# Patient Record
Sex: Male | Born: 1940 | Race: White | Hispanic: No | Marital: Married | Smoking: Former smoker
Health system: Southern US, Community
[De-identification: ages and names within clinical notes are randomized; demographics above are authoritative.]

## PROBLEM LIST (undated history)

## (undated) DIAGNOSIS — N529 Male erectile dysfunction, unspecified: Secondary | ICD-10-CM

## (undated) DIAGNOSIS — E785 Hyperlipidemia, unspecified: Secondary | ICD-10-CM

## (undated) DIAGNOSIS — T7840XA Allergy, unspecified, initial encounter: Secondary | ICD-10-CM

## (undated) DIAGNOSIS — I10 Essential (primary) hypertension: Secondary | ICD-10-CM

## (undated) DIAGNOSIS — E119 Type 2 diabetes mellitus without complications: Secondary | ICD-10-CM

## (undated) DIAGNOSIS — K219 Gastro-esophageal reflux disease without esophagitis: Secondary | ICD-10-CM

## (undated) DIAGNOSIS — H409 Unspecified glaucoma: Secondary | ICD-10-CM

## (undated) DIAGNOSIS — M720 Palmar fascial fibromatosis [Dupuytren]: Secondary | ICD-10-CM

## (undated) DIAGNOSIS — H3320 Serous retinal detachment, unspecified eye: Secondary | ICD-10-CM

## (undated) HISTORY — PX: EYE SURGERY: SHX253

## (undated) HISTORY — PX: HERNIA REPAIR: SHX51

## (undated) HISTORY — DX: Unspecified glaucoma: H40.9

## (undated) HISTORY — DX: Serous retinal detachment, unspecified eye: H33.20

## (undated) HISTORY — PX: CATARACT EXTRACTION: SUR2

## (undated) HISTORY — PX: RETINAL DETACHMENT SURGERY: SHX105

## (undated) HISTORY — DX: Gastro-esophageal reflux disease without esophagitis: K21.9

## (undated) HISTORY — DX: Hyperlipidemia, unspecified: E78.5

## (undated) HISTORY — DX: Palmar fascial fibromatosis (dupuytren): M72.0

## (undated) HISTORY — DX: Type 2 diabetes mellitus without complications: E11.9

## (undated) HISTORY — DX: Essential (primary) hypertension: I10

## (undated) HISTORY — DX: Male erectile dysfunction, unspecified: N52.9

## (undated) HISTORY — DX: Allergy, unspecified, initial encounter: T78.40XA

---

## 2004-10-05 ENCOUNTER — Ambulatory Visit: Payer: Self-pay | Admitting: Family Medicine

## 2004-10-12 ENCOUNTER — Ambulatory Visit: Payer: Self-pay | Admitting: Family Medicine

## 2004-11-11 ENCOUNTER — Ambulatory Visit: Payer: Self-pay | Admitting: Family Medicine

## 2004-11-22 ENCOUNTER — Ambulatory Visit: Payer: Self-pay | Admitting: Internal Medicine

## 2004-11-22 LAB — HM COLONOSCOPY

## 2007-01-01 ENCOUNTER — Ambulatory Visit: Payer: Self-pay | Admitting: Ophthalmology

## 2007-02-20 ENCOUNTER — Ambulatory Visit: Payer: Self-pay | Admitting: Ophthalmology

## 2007-10-13 ENCOUNTER — Ambulatory Visit: Payer: Self-pay | Admitting: Family Medicine

## 2008-09-09 ENCOUNTER — Ambulatory Visit: Payer: Self-pay | Admitting: Family Medicine

## 2011-02-12 ENCOUNTER — Ambulatory Visit: Payer: Self-pay | Admitting: Surgery

## 2014-01-13 LAB — LIPID PANEL
Cholesterol: 150 mg/dL (ref 0–200)
HDL: 44 mg/dL (ref 35–70)
LDL CALC: 83 mg/dL
LDl/HDL Ratio: 1.9
Triglycerides: 113 mg/dL (ref 40–160)

## 2014-01-13 LAB — BASIC METABOLIC PANEL
BUN: 20 mg/dL (ref 4–21)
CREATININE: 0.8 mg/dL (ref 0.6–1.3)
GLUCOSE: 124 mg/dL
POTASSIUM: 4.8 mmol/L (ref 3.4–5.3)
SODIUM: 141 mmol/L (ref 137–147)

## 2014-01-13 LAB — HEPATIC FUNCTION PANEL
ALT: 29 U/L (ref 10–40)
AST: 20 U/L (ref 14–40)
Alkaline Phosphatase: 77 U/L (ref 25–125)
Bilirubin, Total: 0.3 mg/dL

## 2014-01-13 LAB — PSA: PSA: 4.4

## 2014-05-11 LAB — HEMOGLOBIN A1C: HEMOGLOBIN A1C: 5.8 % (ref 4.0–6.0)

## 2014-09-24 DIAGNOSIS — H409 Unspecified glaucoma: Secondary | ICD-10-CM | POA: Insufficient documentation

## 2014-09-24 DIAGNOSIS — K130 Diseases of lips: Secondary | ICD-10-CM | POA: Insufficient documentation

## 2014-09-24 DIAGNOSIS — N521 Erectile dysfunction due to diseases classified elsewhere: Secondary | ICD-10-CM

## 2014-09-24 DIAGNOSIS — R972 Elevated prostate specific antigen [PSA]: Secondary | ICD-10-CM | POA: Insufficient documentation

## 2014-09-24 DIAGNOSIS — M720 Palmar fascial fibromatosis [Dupuytren]: Secondary | ICD-10-CM | POA: Insufficient documentation

## 2014-09-24 DIAGNOSIS — E11610 Type 2 diabetes mellitus with diabetic neuropathic arthropathy: Secondary | ICD-10-CM | POA: Insufficient documentation

## 2014-09-24 DIAGNOSIS — I10 Essential (primary) hypertension: Secondary | ICD-10-CM | POA: Insufficient documentation

## 2014-09-24 DIAGNOSIS — E782 Mixed hyperlipidemia: Secondary | ICD-10-CM | POA: Insufficient documentation

## 2014-09-24 DIAGNOSIS — J309 Allergic rhinitis, unspecified: Secondary | ICD-10-CM | POA: Insufficient documentation

## 2014-09-24 DIAGNOSIS — K219 Gastro-esophageal reflux disease without esophagitis: Secondary | ICD-10-CM | POA: Insufficient documentation

## 2014-09-24 DIAGNOSIS — H3320 Serous retinal detachment, unspecified eye: Secondary | ICD-10-CM | POA: Insufficient documentation

## 2014-09-24 DIAGNOSIS — R7401 Elevation of levels of liver transaminase levels: Secondary | ICD-10-CM | POA: Insufficient documentation

## 2014-09-24 DIAGNOSIS — E785 Hyperlipidemia, unspecified: Secondary | ICD-10-CM | POA: Insufficient documentation

## 2014-09-24 DIAGNOSIS — K4090 Unilateral inguinal hernia, without obstruction or gangrene, not specified as recurrent: Secondary | ICD-10-CM | POA: Insufficient documentation

## 2014-09-24 DIAGNOSIS — N400 Enlarged prostate without lower urinary tract symptoms: Secondary | ICD-10-CM | POA: Insufficient documentation

## 2014-09-24 DIAGNOSIS — M5412 Radiculopathy, cervical region: Secondary | ICD-10-CM | POA: Insufficient documentation

## 2014-09-24 DIAGNOSIS — E7800 Pure hypercholesterolemia, unspecified: Secondary | ICD-10-CM | POA: Insufficient documentation

## 2014-09-24 DIAGNOSIS — E1169 Type 2 diabetes mellitus with other specified complication: Secondary | ICD-10-CM | POA: Insufficient documentation

## 2014-11-04 ENCOUNTER — Ambulatory Visit (INDEPENDENT_AMBULATORY_CARE_PROVIDER_SITE_OTHER): Payer: PPO | Admitting: Family Medicine

## 2014-11-04 ENCOUNTER — Encounter: Payer: Self-pay | Admitting: Family Medicine

## 2014-11-04 VITALS — BP 102/56 | HR 76 | Temp 98.1°F | Resp 16 | Ht 74.0 in | Wt 211.0 lb

## 2014-11-04 DIAGNOSIS — R972 Elevated prostate specific antigen [PSA]: Secondary | ICD-10-CM

## 2014-11-04 DIAGNOSIS — E785 Hyperlipidemia, unspecified: Secondary | ICD-10-CM | POA: Diagnosis not present

## 2014-11-04 DIAGNOSIS — E119 Type 2 diabetes mellitus without complications: Secondary | ICD-10-CM | POA: Diagnosis not present

## 2014-11-04 DIAGNOSIS — I10 Essential (primary) hypertension: Secondary | ICD-10-CM

## 2014-11-04 NOTE — Progress Notes (Signed)
Patient ID: Roberto Leonard, male   DOB: [birth year], 74 y.o.   MRN: [id number]   PHILOPATER MUCHA  MRN: [id number] DOB: August 18, 1940  Subjective:  HPI   1. Type 2 diabetes mellitus without complication Patient is a 74 year old male who presents for follow up of his diabetes.  His last visit was on 05/11/14 and his A1C at that time was 5.8.  No management changes were made at the last visit.  He is currently taking Actos 15 mg daily. He reports good compliance and tolerance of the medicine.  He is checking his glucose about every other week and states they have been running about 100-110.  2. Essential hypertension Patient was in 6 months ago and at that time his blood pressure was found to be 108/68.  He is on Losartan and Amlodipine.  The patient reports that he was having trouble with his feet about 2 months ago and stopped taking the Amlodipine for a month to see if it could be contributing to his problem.  Patient did not see any improvement in his symptoms being off of the Amlodipine and it wasn't until he put double inserts into his shoes did he get any relief of his foot pain.  Patient has been back on the Amlodipine for about 1 month now and does not feel he is having any side effects.  3. Hyperlipidemia Patient is on Simvastatin for his cholesterol  His last lab was about 9 months ago.  He reports good compliance and tolerance of his medication.   Patient Active Problem List   Diagnosis Date Noted  . Allergic rhinitis 09/24/2014  . 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 Cheilitis 09/24/2014  . Abnormal prostate specific antigen 09/24/2014  . Erectile dysfunction associated with type 2 diabetes mellitus 09/24/2014  . 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 Essential (primary) hypertension 09/24/2014  . Acid reflux 09/24/2014  . Glaucoma 09/24/2014  . Inguinal hernia 09/24/2014  . Hypercholesteremia 09/24/2014  . Detached retina  09/24/2014  . Type 2 diabetes mellitus with diabetic neuropathic arthropathy 09/24/2014    Past Medical History  Diagnosis Date  . Allergy   . GERD (gastroesophageal reflux disease)   . Hypertension   . Diabetes mellitus without complication   . Hyperlipidemia   . ED (erectile dysfunction)   . Dupuytren's contracture   . Glaucoma   . Detached retina     History   Social History  . Marital Status: Married    Spouse Name: N/A  . Number of Children: N/A  . Years of Education: N/A   Occupational History  . Not on file.   Social History Main Topics  . Smoking status: Former Smoker -- 0.50 packs/day for 2 years    Types: Cigarettes    Quit date: 05/14/1963  . Smokeless tobacco: Not on file  . Alcohol Use: No  . Drug Use: Not on file  . Sexual Activity: Not on file   Other Topics Concern  . Not on file   Social History Narrative    Outpatient Prescriptions Prior to Visit  Medication Sig Dispense Refill  . amLODipine (NORVASC) 10 MG tablet Take 1 tablet by mouth daily.    Marland Kitchen aspirin 81 MG tablet Take 1 tablet by mouth daily.    . Cetirizine HCl 10 MG CAPS Take 1 capsule by mouth daily as needed.    . Cholecalciferol (VITAMIN D) 2000 UNITS CAPS Take 1 capsule  by mouth daily.    Marland Kitchen losartan (COZAAR) 100 MG tablet Take 1 tablet by mouth daily.    Marland Kitchen Lysine HCl 1000 MG TABS Take 1 tablet by mouth daily.    . MULTIPLE VITAMIN PO Take 1 tablet by mouth daily.    . niacin 500 MG tablet Take 1 tablet by mouth daily.    . Omega-3 Fatty Acids (FISH OIL BURP-LESS) 1200 MG CAPS Take 2 capsules by mouth daily.    Marland Kitchen omeprazole (PRILOSEC) 20 MG capsule Take 1 capsule by mouth daily.    . pioglitazone (ACTOS) 15 MG tablet Take 1 tablet by mouth daily.    . sildenafil (VIAGRA) 100 MG tablet Take 1 tablet by mouth daily as needed.    . simvastatin (ZOCOR) 20 MG tablet Take 1 tablet by mouth at bedtime.     No facility-administered medications prior to visit.    Allergies  Allergen  Reactions  . Sulfa Antibiotics     Review of Systems  Constitutional: Negative.   Respiratory: Negative.   Cardiovascular: Negative.    Objective:  BP 102/56 mmHg  Pulse 76  Temp(Src) 98.1 F (36.7 C) (Oral)  Resp 16  Ht 6\' 2"  (1.88 m)  Wt 211 lb (95.709 kg)  BMI 27.08 kg/m2  Physical Exam  Constitutional: He is well-developed, well-nourished, and in no distress.  Cardiovascular: Normal rate and normal heart sounds.   Pulmonary/Chest: Effort normal and breath sounds normal.    Assessment and Plan :   1. Type 2 diabetes mellitus without complication  - HgB V4Q  2. Essential hypertension  - CBC With Differential/Platelet - COMPLETE METABOLIC PANEL WITH GFR - TSH  3. Hyperlipidemia  - Lipid Panel With LDL/HDL Ratio  4. Elevated PSA  - PSA  5. Foot pain with bony changes to the lateral aspect of the fifth tarsal head Plantar fasciitis, also the right foot. His problems are improving with  padding and wider shoes.  Miguel Aschoff MD [facility] 11/04/2014 3:28 PM

## 2014-11-06 LAB — LIPID PANEL WITH LDL/HDL RATIO
Cholesterol, Total: 139 mg/dL (ref 100–199)
HDL: 40 mg/dL (ref >39)
LDL Calculated: 77 mg/dL (ref 0–99)
LDl/HDL Ratio: 1.9 ratio units (ref 0.0–3.6)
TRIGLYCERIDES: 112 mg/dL (ref 0–149)
VLDL Cholesterol Cal: 22 mg/dL (ref 5–40)

## 2014-11-06 LAB — COMPREHENSIVE METABOLIC PANEL
A/G RATIO: 1.7 (ref 1.1–2.5)
ALK PHOS: 72 IU/L (ref 39–117)
ALT: 34 IU/L (ref 0–44)
AST: 21 IU/L (ref 0–40)
Albumin: 4.3 g/dL (ref 3.5–4.8)
BILIRUBIN TOTAL: 0.5 mg/dL (ref 0.0–1.2)
BUN/Creatinine Ratio: 24 — ABNORMAL HIGH (ref 10–22)
BUN: 18 mg/dL (ref 8–27)
CHLORIDE: 103 mmol/L (ref 97–108)
CO2: 22 mmol/L (ref 18–29)
CREATININE: 0.75 mg/dL — AB (ref 0.76–1.27)
Calcium: 9.3 mg/dL (ref 8.6–10.2)
GFR, EST AFRICAN AMERICAN: 104 mL/min/{1.73_m2} (ref >59)
GFR, EST NON AFRICAN AMERICAN: 90 mL/min/{1.73_m2} (ref >59)
GLOBULIN, TOTAL: 2.5 g/dL (ref 1.5–4.5)
GLUCOSE: 120 mg/dL — AB (ref 65–99)
POTASSIUM: 4.6 mmol/L (ref 3.5–5.2)
Sodium: 143 mmol/L (ref 134–144)
Total Protein: 6.8 g/dL (ref 6.0–8.5)

## 2014-11-06 LAB — TSH: TSH: 1.28 u[IU]/mL (ref 0.450–4.500)

## 2014-11-06 LAB — HEMOGLOBIN A1C
Est. average glucose Bld gHb Est-mCnc: 137 mg/dL
Hgb A1c MFr Bld: 6.4 % — ABNORMAL HIGH (ref 4.8–5.6)

## 2014-11-06 LAB — PSA: Prostate Specific Ag, Serum: 5.3 ng/mL — ABNORMAL HIGH (ref 0.0–4.0)

## 2014-11-10 LAB — CBC WITH DIFFERENTIAL/PLATELET

## 2014-11-16 ENCOUNTER — Telehealth: Payer: Self-pay

## 2014-11-16 NOTE — Telephone Encounter (Signed)
LMTCB ED 

## 2014-11-16 NOTE — Telephone Encounter (Signed)
-----   Message from [name]., MD sent at 11/13/2014  7:50 PM EDT ----- Labs stable.

## 2014-11-17 NOTE — Telephone Encounter (Signed)
Pt advised-aa 

## 2014-11-17 NOTE — Telephone Encounter (Signed)
Pt returning call.  CB#[phone]/MJ

## 2014-11-25 ENCOUNTER — Telehealth: Payer: Self-pay | Admitting: Family Medicine

## 2014-11-25 DIAGNOSIS — Z1211 Encounter for screening for malignant neoplasm of colon: Secondary | ICD-10-CM

## 2014-11-25 NOTE — Telephone Encounter (Signed)
Roberto Leonard from Dr Dorothey Baseman office states that pt came into their office inquiring about his referral for a colonoscopy.There is no order so she could not set anything up Call back # [phone]

## 2014-11-26 ENCOUNTER — Telehealth: Payer: Self-pay | Admitting: Gastroenterology

## 2014-11-26 ENCOUNTER — Telehealth: Payer: Self-pay

## 2014-11-26 ENCOUNTER — Other Ambulatory Visit: Payer: Self-pay

## 2014-11-26 NOTE — Telephone Encounter (Signed)
Gastroenterology Pre-Procedure Review  Request Date: 01-03-15 Requesting Physician: Dr. Rosanna Randy  PATIENT REVIEW QUESTIONS: The patient responded to the following health history questions as indicated:    1. Are you having any GI issues? no 2. Do you have a personal history of Polyps? no 3. Do you have a family history of Colon Cancer or Polyps? no 4. Diabetes Mellitus? yes (Type 2) 5. Joint replacements in the past 12 months?no 6. Major health problems in the past 3 months?no 7. Any artificial heart valves, MVP, or defibrillator?no    MEDICATIONS & ALLERGIES:    Patient reports the following regarding taking any anticoagulation/antiplatelet therapy:   Plavix, Coumadin, Eliquis, Xarelto, Lovenox, Pradaxa, Brilinta, or Effient? no Aspirin? yes (81mg )  Patient confirms/reports the following medications:  Current Outpatient Prescriptions  Medication Sig Dispense Refill  . amLODipine (NORVASC) 10 MG tablet Take 1 tablet by mouth daily.    Marland Kitchen aspirin 81 MG tablet Take 1 tablet by mouth daily.    . Cetirizine HCl 10 MG CAPS Take 1 capsule by mouth daily as needed.    . Cholecalciferol (VITAMIN D) 2000 UNITS CAPS Take 1 capsule by mouth daily.    Marland Kitchen losartan (COZAAR) 100 MG tablet Take 1 tablet by mouth daily.    Marland Kitchen Lysine HCl 1000 MG TABS Take 1 tablet by mouth daily.    . MULTIPLE VITAMIN PO Take 1 tablet by mouth daily.    . niacin 500 MG tablet Take 1 tablet by mouth daily.    . Omega-3 Fatty Acids (FISH OIL BURP-LESS) 1200 MG CAPS Take 2 capsules by mouth daily.    Marland Kitchen omeprazole (PRILOSEC) 20 MG capsule Take 1 capsule by mouth daily.    . pioglitazone (ACTOS) 15 MG tablet Take 1 tablet by mouth daily.    . sildenafil (VIAGRA) 100 MG tablet Take 1 tablet by mouth daily as needed.    . simvastatin (ZOCOR) 20 MG tablet Take 1 tablet by mouth at bedtime.     No current facility-administered medications for this visit.    Patient confirms/reports the following allergies:  Allergies   Allergen Reactions  . Sulfa Antibiotics     No orders of the defined types were placed in this encounter.    AUTHORIZATION INFORMATION Primary Insurance: 1D#: Group #:  Secondary Insurance: 1D#: Group #:  SCHEDULE INFORMATION: Date: 01-03-15 Time: Location: [place]

## 2014-11-26 NOTE — Telephone Encounter (Signed)
Patient came into office on 11/25/14 to inquire about his referral from Dr Rosanna Randy to GI for colonoscopy screening. I informed him we didn't have a referral, but I would call Dr Alben Spittle office to check on the status of the referral. I called Dr Alben Spittle office and let them know and they sent the referral later that day (7/14)

## 2014-12-31 NOTE — Discharge Instructions (Signed)

## 2015-01-03 ENCOUNTER — Ambulatory Visit: Payer: PPO | Admitting: Anesthesiology

## 2015-01-03 ENCOUNTER — Other Ambulatory Visit: Payer: Self-pay | Admitting: Gastroenterology

## 2015-01-03 ENCOUNTER — Ambulatory Visit
Admission: RE | Admit: 2015-01-03 | Discharge: 2015-01-03 | Disposition: A | Discharge status: Home / Self Care | Payer: PPO | Source: Ambulatory Visit | Attending: Gastroenterology | Admitting: Gastroenterology

## 2015-01-03 ENCOUNTER — Encounter
Admission: RE | Disposition: A | Discharge status: Home / Self Care | Payer: Self-pay | Source: Ambulatory Visit | Attending: Gastroenterology

## 2015-01-03 DIAGNOSIS — K5730 Diverticulosis of large intestine without perforation or abscess without bleeding: Secondary | ICD-10-CM | POA: Diagnosis not present

## 2015-01-03 DIAGNOSIS — N529 Male erectile dysfunction, unspecified: Secondary | ICD-10-CM | POA: Insufficient documentation

## 2015-01-03 DIAGNOSIS — E119 Type 2 diabetes mellitus without complications: Secondary | ICD-10-CM | POA: Diagnosis not present

## 2015-01-03 DIAGNOSIS — Z1211 Encounter for screening for malignant neoplasm of colon: Secondary | ICD-10-CM | POA: Insufficient documentation

## 2015-01-03 DIAGNOSIS — K219 Gastro-esophageal reflux disease without esophagitis: Secondary | ICD-10-CM | POA: Insufficient documentation

## 2015-01-03 DIAGNOSIS — K640 First degree hemorrhoids: Secondary | ICD-10-CM | POA: Diagnosis not present

## 2015-01-03 DIAGNOSIS — H409 Unspecified glaucoma: Secondary | ICD-10-CM | POA: Insufficient documentation

## 2015-01-03 DIAGNOSIS — Z7982 Long term (current) use of aspirin: Secondary | ICD-10-CM | POA: Insufficient documentation

## 2015-01-03 DIAGNOSIS — D125 Benign neoplasm of sigmoid colon: Secondary | ICD-10-CM | POA: Insufficient documentation

## 2015-01-03 DIAGNOSIS — Z882 Allergy status to sulfonamides status: Secondary | ICD-10-CM | POA: Insufficient documentation

## 2015-01-03 DIAGNOSIS — Z79899 Other long term (current) drug therapy: Secondary | ICD-10-CM | POA: Insufficient documentation

## 2015-01-03 DIAGNOSIS — E785 Hyperlipidemia, unspecified: Secondary | ICD-10-CM | POA: Insufficient documentation

## 2015-01-03 DIAGNOSIS — I10 Essential (primary) hypertension: Secondary | ICD-10-CM | POA: Diagnosis not present

## 2015-01-03 DIAGNOSIS — Z87891 Personal history of nicotine dependence: Secondary | ICD-10-CM | POA: Insufficient documentation

## 2015-01-03 HISTORY — PX: COLONOSCOPY WITH PROPOFOL: SHX5780

## 2015-01-03 HISTORY — PX: POLYPECTOMY: SHX5525

## 2015-01-03 LAB — GLUCOSE, CAPILLARY
GLUCOSE-CAPILLARY: 109 mg/dL — AB (ref 65–99)
Glucose-Capillary: 117 mg/dL — ABNORMAL HIGH (ref 65–99)

## 2015-01-03 SURGERY — COLONOSCOPY WITH PROPOFOL
Anesthesia: Monitor Anesthesia Care | Wound class: Contaminated

## 2015-01-03 MED ORDER — ACETAMINOPHEN 325 MG PO TABS: 325.0000 mg | ORAL_TABLET | ORAL | Status: DC | PRN

## 2015-01-03 MED ORDER — LACTATED RINGERS IV SOLN
INTRAVENOUS | Status: DC
  Administered 2015-01-03: 08:00:00 via INTRAVENOUS

## 2015-01-03 MED ORDER — ACETAMINOPHEN 160 MG/5ML PO SOLN: 325.0000 mg | ORAL | Status: DC | PRN

## 2015-01-03 MED ORDER — SODIUM CHLORIDE 0.9 % IV SOLN: INTRAVENOUS | Status: DC

## 2015-01-03 MED ORDER — LIDOCAINE HCL (CARDIAC) 20 MG/ML IV SOLN
INTRAVENOUS | Status: DC | PRN
  Administered 2015-01-03: 40 mg via INTRAVENOUS

## 2015-01-03 MED ORDER — PROPOFOL 10 MG/ML IV BOLUS
INTRAVENOUS | Status: DC | PRN
  Administered 2015-01-03 (×3): 50 mg via INTRAVENOUS
  Administered 2015-01-03: 100 mg via INTRAVENOUS

## 2015-01-03 SURGICAL SUPPLY — 28 items

## 2015-01-03 NOTE — Anesthesia Preprocedure Evaluation (Signed)
Anesthesia Evaluation  Patient identified by MRN, date of birth, ID band  Reviewed: Allergy & Precautions, H&P , NPO status , Patient's Chart, lab work & pertinent test results  Airway Mallampati: I  TM Distance: >3 FB Neck ROM: full    Dental no notable dental hx.    Pulmonary former smoker,    Pulmonary exam normal       Cardiovascular hypertension, Rhythm:regular Rate:Normal     Neuro/Psych    GI/Hepatic GERD-  ,  Endo/Other  diabetes, Well Controlled, Type 2  Renal/GU      Musculoskeletal   Abdominal   Peds  Hematology   Anesthesia Other Findings   Reproductive/Obstetrics                             Anesthesia Physical Anesthesia Plan  ASA: II  Anesthesia Plan: MAC   Post-op Pain Management:    Induction:   Airway Management Planned:   Additional Equipment:   Intra-op Plan:   Post-operative Plan:   Informed Consent: I have reviewed the patients History and Physical, chart, labs and discussed the procedure including the risks, benefits and alternatives for the proposed anesthesia with the patient or authorized representative who has indicated his/her understanding and acceptance.     Plan Discussed with: CRNA  Anesthesia Plan Comments:         Anesthesia Quick Evaluation

## 2015-01-03 NOTE — Transfer of Care (Signed)
Immediate Anesthesia Transfer of Care Note  Patient: Roberto Leonard  Procedure(s) Performed: Procedure(s) with comments: COLONOSCOPY WITH PROPOFOL (N/A) - DIABETIC-ORAL MEDS  Patient Location: PACU  Anesthesia Type: MAC  Level of Consciousness: awake, alert  and patient cooperative  Airway and Oxygen Therapy: Patient Spontanous Breathing and Patient connected to supplemental oxygen  Post-op Assessment: Post-op Vital signs reviewed, Patient's Cardiovascular Status Stable, Respiratory Function Stable, Patent Airway and No signs of Nausea or vomiting  Post-op Vital Signs: Reviewed and stable  Complications: No apparent anesthesia complications

## 2015-01-03 NOTE — Anesthesia Procedure Notes (Signed)
Procedure Name: MAC Performed by: Morrisa Aldaba Pre-anesthesia Checklist: Patient identified, Emergency Drugs available, Suction available, Timeout performed and Patient being monitored Patient Re-evaluated:Patient Re-evaluated prior to inductionOxygen Delivery Method: Nasal cannula Placement Confirmation: positive ETCO2     

## 2015-01-03 NOTE — Op Note (Signed)
[facility] Patient Name: Roberto Leonard Procedure Date: 01/03/2015 7:25 AM MRN: [id number] Account #: [id number] Date of Birth: 28-Dec-1940 Admit Type: Outpatient Age: 74 Room: [facility] OR ROOM 01 Gender: Male Note Status: Finalized Procedure:         Colonoscopy Indications:       Screening for colorectal malignant neoplasm Providers:         Lucilla Lame, MD Referring MD:      [name], MD (Referring MD) Medicines:         Propofol per Anesthesia Complications:     No immediate complications. Procedure:         Pre-Anesthesia Assessment:                    - Prior to the procedure, a History and Physical was                     performed, and patient medications and allergies were                     reviewed. The patient's tolerance of previous anesthesia                     was also reviewed. The risks and benefits of the procedure                     and the sedation options and risks were discussed with the                     patient. All questions were answered, and informed consent                     was obtained. Prior Anticoagulants: The patient has taken                     no previous anticoagulant or antiplatelet agents. ASA                     Grade Assessment: II - A patient with mild systemic                     disease. After reviewing the risks and benefits, the                     patient was deemed in satisfactory condition to undergo                     the procedure.                    After obtaining informed consent, the colonoscope was                     passed under direct vision. Throughout the procedure, the                     patient's blood pressure, pulse, and oxygen saturations                     were monitored continuously. The Olympus CF H180AL                     colonoscope (S#: [id number]) was introduced through the anus  and advanced to the the cecum, identified by appendiceal                 orifice and ileocecal valve. The colonoscopy was performed                     without difficulty. The patient tolerated the procedure                     well. The quality of the bowel preparation was good. Findings:      The perianal and digital rectal examinations were normal.      A 4 mm polyp was found in the sigmoid colon. The polyp was sessile. The       polyp was removed with a cold snare. Resection and retrieval were       complete.      Non-bleeding internal hemorrhoids were found during retroflexion. The       hemorrhoids were Grade I (internal hemorrhoids that do not prolapse).      Multiple small-mouthed diverticula were found in the sigmoid colon. Impression:        - One 4 mm polyp in the sigmoid colon. Resected and                     retrieved.                    - Non-bleeding internal hemorrhoids.                    - Diverticulosis in the sigmoid colon. Recommendation:    - Await pathology results.                    - Repeat colonoscopy in 5 years if polyp adenoma and 10                     years if hyperplastic Procedure Code(s): --- Professional ---                    [phone], Colonoscopy, flexible; with removal of tumor(s),                     polyp(s), or other lesion(s) by snare technique Diagnosis Code(s): --- Professional ---                    Z12.11, Encounter for screening for malignant neoplasm of                     colon                    D12.5, Benign neoplasm of sigmoid colon CPT copyright 2014 American Medical Association. All rights reserved. The codes documented in this report are preliminary and upon coder review may  be revised to meet current compliance requirements. Lucilla Lame, MD 01/03/2015 7:56:40 AM This report has been signed electronically. Number of Addenda: 0 Note Initiated On: 01/03/2015 7:25 AM Scope Withdrawal Time: 0 hours 7 minutes 36 seconds  Total Procedure Duration: 0 hours 12 minutes 59 seconds       [facility]

## 2015-01-03 NOTE — Anesthesia Postprocedure Evaluation (Signed)
  Anesthesia Post-op Note  Patient: Roberto Leonard  Procedure(s) Performed: Procedure(s) with comments: COLONOSCOPY WITH PROPOFOL (N/A) - DIABETIC-ORAL MEDS POLYPECTOMY (N/A) - Sigmoid polyp  Anesthesia type:MAC  Patient location: PACU  Post pain: Pain level controlled  Post assessment: Post-op Vital signs reviewed, Patient's Cardiovascular Status Stable, Respiratory Function Stable, Patent Airway and No signs of Nausea or vomiting  Post vital signs: Reviewed and stable  Last Vitals:  Filed Vitals:   01/03/15 0815  BP: 109/58  Pulse: 69  Temp:   Resp: 13    Level of consciousness: awake, alert  and patient cooperative  Complications: No apparent anesthesia complications

## 2015-01-03 NOTE — H&P (Signed)
[facility]  [address], [place], [place] [address] Phone: [phone] Fax : [phone]  Primary Care Physician:  [name], MD Primary Gastroenterologist:  Dr. Allen Norris  Pre-Procedure History & Physical: HPI:  Roberto Leonard is a 74 y.o. male is here for a screening colonoscopy.   Past Medical History  Diagnosis Date  . Allergy   . GERD (gastroesophageal reflux disease)   . Hypertension   . Hyperlipidemia   . ED (erectile dysfunction)   . Dupuytren's contracture   . Glaucoma   . Detached retina     RIGHT  . Diabetes mellitus without complication     ORAL MED    Past Surgical History  Procedure Laterality Date  . Cataract extraction      right  . Retinal detachment surgery      right eye, wrong size lense, secondary surgery to replace it  . Hernia repair      Prior to Admission medications   Medication Sig Start Date End Date Taking? Authorizing Provider  amLODipine (NORVASC) 10 MG tablet Take 1 tablet by mouth daily. PM 02/19/14  Yes Historical Provider, MD  aspirin 81 MG tablet Take 1 tablet by mouth daily. 03/26/11  Yes Historical Provider, MD  Cetirizine HCl 10 MG CAPS Take 1 capsule by mouth daily as needed. 03/26/11  Yes Historical Provider, MD  Cholecalciferol ([employer]S VIT D 5000 HIGH-POTENCY) 5000 UNITS capsule Take 5,000 Units by mouth daily.   Yes Historical Provider, MD  MULTIPLE VITAMIN PO Take 1 tablet by mouth daily. 03/26/11  Yes Historical Provider, MD  niacin 500 MG tablet Take 1 tablet by mouth daily. 03/26/11  Yes Historical Provider, MD  Omega-3 Fatty Acids (FISH OIL BURP-LESS) 1200 MG CAPS Take 2 capsules by mouth daily. 03/26/11  Yes Historical Provider, MD  losartan (COZAAR) 100 MG tablet Take 1 tablet by mouth daily. AM 02/19/14   Historical Provider, MD  Lysine HCl 1000 MG TABS Take 1 tablet by mouth daily.    Historical Provider, MD  omeprazole (PRILOSEC) 20 MG capsule Take 1 capsule by mouth daily. AM 08/02/14    Historical Provider, MD  pioglitazone (ACTOS) 15 MG tablet Take 1 tablet by mouth daily. AM 02/19/14   Historical Provider, MD  sildenafil (VIAGRA) 100 MG tablet Take 1 tablet by mouth daily as needed. 03/26/11   Historical Provider, MD  simvastatin (ZOCOR) 20 MG tablet Take 1 tablet by mouth at bedtime. 09/06/14   Historical Provider, MD    Allergies as of 11/26/2014 - Review Complete 11/26/2014  Allergen Reaction Noted  . Sulfa antibiotics  09/24/2014    Family History  Problem Relation Age of Onset  . Heart disease Mother     Died from CHF  . Diabetes Mother   . Diabetes Father   . Heart disease Father     Died from MI  . Hypertension Father   . Hypertension Sister   . Diabetes Sister     Social History   Social History  . Marital Status: Married    Spouse Name: N/A  . Number of Children: N/A  . Years of Education: N/A   Occupational History  . Not on file.   Social History Main Topics  . Smoking status: Former Smoker -- 0.50 packs/day for 2 years    Types: Cigarettes    Quit date: 05/14/1963  . Smokeless tobacco: Not on file  . Alcohol Use: No  . Drug Use: Not on file  . Sexual  Activity: Not on file   Other Topics Concern  . Not on file   Social History Narrative    Review of Systems: See HPI, otherwise negative ROS  Physical Exam: BP 122/61 mmHg  Pulse 63  Temp(Src) 97.7 F (36.5 C) (Oral)  Resp 16  Ht 6\' 2"  (1.88 m)  Wt 201 lb (91.173 kg)  BMI 25.80 kg/m2  SpO2 98% General:   Alert,  pleasant and cooperative in NAD Head:  Normocephalic and atraumatic. Neck:  Supple; no masses or thyromegaly. Lungs:  Clear throughout to auscultation.    Heart:  Regular rate and rhythm. Abdomen:  Soft, nontender and nondistended. Normal bowel sounds, without guarding, and without rebound.   Neurologic:  Alert and  oriented x4;  grossly normal neurologically.  Impression/Plan: Roberto Leonard is now here to undergo a screening colonoscopy.  Risks, benefits,  and alternatives regarding colonoscopy have been reviewed with the patient.  Questions have been answered.  All parties agreeable.

## 2015-01-04 ENCOUNTER — Encounter: Payer: Self-pay | Admitting: Gastroenterology

## 2015-01-10 ENCOUNTER — Encounter: Payer: Self-pay | Admitting: Gastroenterology

## 2015-01-27 ENCOUNTER — Other Ambulatory Visit: Payer: Self-pay | Admitting: Family Medicine

## 2015-02-10 ENCOUNTER — Encounter: Payer: Self-pay | Admitting: Family Medicine

## 2015-02-10 ENCOUNTER — Ambulatory Visit (INDEPENDENT_AMBULATORY_CARE_PROVIDER_SITE_OTHER): Payer: PPO | Admitting: Family Medicine

## 2015-02-10 VITALS — BP 112/68 | Temp 97.5°F | Resp 16 | Ht 74.0 in | Wt 210.0 lb

## 2015-02-10 DIAGNOSIS — Z23 Encounter for immunization: Secondary | ICD-10-CM | POA: Diagnosis not present

## 2015-02-10 DIAGNOSIS — E11610 Type 2 diabetes mellitus with diabetic neuropathic arthropathy: Secondary | ICD-10-CM

## 2015-02-10 LAB — POCT GLYCOSYLATED HEMOGLOBIN (HGB A1C): HEMOGLOBIN A1C: 6

## 2015-02-10 NOTE — Progress Notes (Signed)
Patient ID: Roberto Leonard, male   DOB: 1941/02/28, 74 y.o.   MRN: [id number]       Patient: Roberto Leonard Male    DOB: 1941-03-30   74 y.o.   MRN: [id number] Visit Date: 02/10/2015  Today's Provider: [name], MD   Chief Complaint  Patient presents with  . Diabetes    3 month F/U.    Subjective:    Diabetes He has type 2 diabetes mellitus. His disease course has been stable. There are no hypoglycemic associated symptoms. There are no diabetic associated symptoms. There are no hypoglycemic complications. Symptoms are stable. There are no diabetic complications. He is compliant with treatment all of the time. He monitors blood glucose at home 1-2 x per week. There is no change in his home blood glucose trend.       Allergies  Allergen Reactions  . Sulfa Antibiotics Other (See Comments)    SWEATS   Previous Medications   AMLODIPINE (NORVASC) 10 MG TABLET    Take 1 tablet by mouth daily. PM   ASPIRIN 81 MG TABLET    Take 1 tablet by mouth daily.   CETIRIZINE HCL 10 MG CAPS    Take 1 capsule by mouth daily as needed.   CHOLECALCIFEROL (CVS VIT D 5000 HIGH-POTENCY) 5000 UNITS CAPSULE    Take 5,000 Units by mouth daily.   LOSARTAN (COZAAR) 100 MG TABLET    Take 1 tablet by mouth daily. AM   LYSINE HCL 1000 MG TABS    Take 1 tablet by mouth daily.   MULTIPLE VITAMIN PO    Take 1 tablet by mouth daily.   NIACIN 500 MG TABLET    Take 1 tablet by mouth daily.   OMEGA-3 FATTY ACIDS (FISH OIL BURP-LESS) 1200 MG CAPS    Take 2 capsules by mouth daily.   OMEPRAZOLE (PRILOSEC) 20 MG CAPSULE    TAKE 1 CAPSULE BY MOUTH DAILY   PIOGLITAZONE (ACTOS) 15 MG TABLET    Take 1 tablet by mouth daily. AM   SILDENAFIL (VIAGRA) 100 MG TABLET    Take 1 tablet by mouth daily as needed.   SIMVASTATIN (ZOCOR) 20 MG TABLET    Take 1 tablet by mouth at bedtime.    Review of Systems  Constitutional: Negative.   Eyes: Negative.   Respiratory: Negative.   Cardiovascular: Negative.     Endocrine: Negative.   Genitourinary: Negative.   Neurological: Negative.   Hematological: Negative.   Psychiatric/Behavioral: Negative.     Social History  Substance Use Topics  . Smoking status: Former Smoker -- 0.50 packs/day for 2 years    Types: Cigarettes    Quit date: 05/14/1963  . Smokeless tobacco: Not on file  . Alcohol Use: No   Objective:   BP 112/68 mmHg  Temp(Src) 97.5 F (36.4 C)  Resp 16  Ht 6\' 2"  (1.88 m)  Wt 210 lb (95.255 kg)  BMI 26.95 kg/m2  Physical Exam  Constitutional: He is oriented to person, place, and time. He appears well-developed and well-nourished.  HENT:  Head: Normocephalic and atraumatic.  Right Ear: External ear normal.  Left Ear: External ear normal.  Nose: Nose normal.  Eyes: Conjunctivae are normal.  Cardiovascular: Normal rate, regular rhythm and normal heart sounds.   Pulmonary/Chest: Effort normal and breath sounds normal.  Abdominal: Soft.  Neurological: He is alert and oriented to person, place, and time.  Skin: Skin is warm and dry.  Psychiatric: He has a normal  mood and affect. His behavior is normal. Judgment and thought content normal.        Assessment & Plan:     1. Type 2 diabetes mellitus with diabetic neuropathic arthropathy Excellent control - POCT glycosylated hemoglobin (Hb A1C)--6.0 today  2. Need for influenza vaccination  - Flu vaccine HIGH DOSE PF       Richard Cranford Mon, MD  [facility]

## 2015-03-21 ENCOUNTER — Other Ambulatory Visit: Payer: Self-pay | Admitting: Family Medicine

## 2015-03-21 ENCOUNTER — Other Ambulatory Visit: Payer: Self-pay

## 2015-03-21 MED ORDER — AMLODIPINE BESYLATE 10 MG PO TABS: 10.0000 mg | ORAL_TABLET | Freq: Every day | ORAL | Status: DC

## 2015-03-21 MED ORDER — LOSARTAN POTASSIUM 100 MG PO TABS: 100.0000 mg | ORAL_TABLET | Freq: Every day | ORAL | Status: DC

## 2015-04-30 ENCOUNTER — Observation Stay
Admission: EM | Admit: 2015-04-30 | Discharge: 2015-05-01 | Disposition: A | Discharge status: Home / Self Care | Payer: PPO | Attending: Specialist | Admitting: Specialist

## 2015-04-30 ENCOUNTER — Emergency Department: Payer: PPO

## 2015-04-30 ENCOUNTER — Encounter: Payer: Self-pay | Admitting: Emergency Medicine

## 2015-04-30 DIAGNOSIS — E7800 Pure hypercholesterolemia, unspecified: Secondary | ICD-10-CM | POA: Insufficient documentation

## 2015-04-30 DIAGNOSIS — Z87891 Personal history of nicotine dependence: Secondary | ICD-10-CM | POA: Insufficient documentation

## 2015-04-30 DIAGNOSIS — J309 Allergic rhinitis, unspecified: Secondary | ICD-10-CM | POA: Insufficient documentation

## 2015-04-30 DIAGNOSIS — G549 Nerve root and plexus disorder, unspecified: Secondary | ICD-10-CM | POA: Diagnosis not present

## 2015-04-30 DIAGNOSIS — M720 Palmar fascial fibromatosis [Dupuytren]: Secondary | ICD-10-CM | POA: Insufficient documentation

## 2015-04-30 DIAGNOSIS — I10 Essential (primary) hypertension: Secondary | ICD-10-CM | POA: Insufficient documentation

## 2015-04-30 DIAGNOSIS — K130 Diseases of lips: Secondary | ICD-10-CM | POA: Insufficient documentation

## 2015-04-30 DIAGNOSIS — I6522 Occlusion and stenosis of left carotid artery: Secondary | ICD-10-CM | POA: Insufficient documentation

## 2015-04-30 DIAGNOSIS — E785 Hyperlipidemia, unspecified: Secondary | ICD-10-CM | POA: Insufficient documentation

## 2015-04-30 DIAGNOSIS — I639 Cerebral infarction, unspecified: Secondary | ICD-10-CM

## 2015-04-30 DIAGNOSIS — Z9841 Cataract extraction status, right eye: Secondary | ICD-10-CM | POA: Insufficient documentation

## 2015-04-30 DIAGNOSIS — Z833 Family history of diabetes mellitus: Secondary | ICD-10-CM | POA: Diagnosis not present

## 2015-04-30 DIAGNOSIS — K4090 Unilateral inguinal hernia, without obstruction or gangrene, not specified as recurrent: Secondary | ICD-10-CM | POA: Insufficient documentation

## 2015-04-30 DIAGNOSIS — Z882 Allergy status to sulfonamides status: Secondary | ICD-10-CM | POA: Insufficient documentation

## 2015-04-30 DIAGNOSIS — K219 Gastro-esophageal reflux disease without esophagitis: Secondary | ICD-10-CM | POA: Insufficient documentation

## 2015-04-30 DIAGNOSIS — Z8249 Family history of ischemic heart disease and other diseases of the circulatory system: Secondary | ICD-10-CM | POA: Diagnosis not present

## 2015-04-30 DIAGNOSIS — I6521 Occlusion and stenosis of right carotid artery: Secondary | ICD-10-CM | POA: Insufficient documentation

## 2015-04-30 DIAGNOSIS — H409 Unspecified glaucoma: Secondary | ICD-10-CM | POA: Insufficient documentation

## 2015-04-30 DIAGNOSIS — R4182 Altered mental status, unspecified: Secondary | ICD-10-CM | POA: Insufficient documentation

## 2015-04-30 DIAGNOSIS — R413 Other amnesia: Principal | ICD-10-CM

## 2015-04-30 DIAGNOSIS — E11610 Type 2 diabetes mellitus with diabetic neuropathic arthropathy: Secondary | ICD-10-CM | POA: Diagnosis not present

## 2015-04-30 DIAGNOSIS — R410 Disorientation, unspecified: Secondary | ICD-10-CM | POA: Diagnosis not present

## 2015-04-30 LAB — BASIC METABOLIC PANEL
ANION GAP: 9 (ref 5–15)
BUN: 15 mg/dL (ref 6–20)
CHLORIDE: 107 mmol/L (ref 101–111)
CO2: 26 mmol/L (ref 22–32)
Calcium: 9 mg/dL (ref 8.9–10.3)
Creatinine, Ser: 0.76 mg/dL (ref 0.61–1.24)
GFR calc Af Amer: >60 mL/min (ref >60)
GFR calc non Af Amer: >60 mL/min (ref >60)
GLUCOSE: 101 mg/dL — AB (ref 65–99)
POTASSIUM: 3.7 mmol/L (ref 3.5–5.1)
Sodium: 142 mmol/L (ref 135–145)

## 2015-04-30 LAB — TROPONIN I
TROPONIN I: 0.04 ng/mL — AB (ref <0.031)
Troponin I: 0.04 ng/mL — ABNORMAL HIGH (ref <0.031)

## 2015-04-30 LAB — URINALYSIS COMPLETE WITH MICROSCOPIC (ARMC ONLY)
BILIRUBIN URINE: NEGATIVE
Bacteria, UA: NONE SEEN
GLUCOSE, UA: NEGATIVE mg/dL
Hgb urine dipstick: NEGATIVE
KETONES UR: NEGATIVE mg/dL
Leukocytes, UA: NEGATIVE
Nitrite: NEGATIVE
PROTEIN: NEGATIVE mg/dL
SPECIFIC GRAVITY, URINE: 1.01 (ref 1.005–1.030)
SQUAMOUS EPITHELIAL / LPF: NONE SEEN
pH: 7 (ref 5.0–8.0)

## 2015-04-30 LAB — CBC WITH DIFFERENTIAL/PLATELET
BASOS PCT: 1 %
Basophils Absolute: 0 10*3/uL (ref 0–0.1)
Eosinophils Absolute: 0.1 10*3/uL (ref 0–0.7)
Eosinophils Relative: 2 %
HEMATOCRIT: 47.8 % (ref 40.0–52.0)
HEMOGLOBIN: 15.6 g/dL (ref 13.0–18.0)
LYMPHS ABS: 1.9 10*3/uL (ref 1.0–3.6)
LYMPHS PCT: 21 %
MCH: 28.6 pg (ref 26.0–34.0)
MCHC: 32.7 g/dL (ref 32.0–36.0)
MCV: 87.4 fL (ref 80.0–100.0)
MONOS PCT: 9 %
Monocytes Absolute: 0.8 10*3/uL (ref 0.2–1.0)
NEUTROS ABS: 6.3 10*3/uL (ref 1.4–6.5)
NEUTROS PCT: 69 %
PLATELETS: 143 10*3/uL — AB (ref 150–440)
RBC: 5.47 MIL/uL (ref 4.40–5.90)
RDW: 13.4 % (ref 11.5–14.5)
WBC: 9.1 10*3/uL (ref 3.8–10.6)

## 2015-04-30 LAB — GLUCOSE, CAPILLARY
Glucose-Capillary: 103 mg/dL — ABNORMAL HIGH (ref 65–99)
Glucose-Capillary: 148 mg/dL — ABNORMAL HIGH (ref 65–99)

## 2015-04-30 LAB — TSH: TSH: 1.172 u[IU]/mL (ref 0.350–4.500)

## 2015-04-30 MED ORDER — ACETAMINOPHEN 325 MG PO TABS: 650.0000 mg | ORAL_TABLET | Freq: Four times a day (QID) | ORAL | Status: DC | PRN

## 2015-04-30 MED ORDER — BRIMONIDINE TARTRATE 0.2 % OP SOLN
1.0000 [drp] | Freq: Two times a day (BID) | OPHTHALMIC | Status: DC
  Administered 2015-04-30 – 2015-05-01 (×2): 1 [drp] via OPHTHALMIC
  Filled 2015-04-30: qty 5

## 2015-04-30 MED ORDER — ONDANSETRON HCL 4 MG/2ML IJ SOLN: 4.0000 mg | Freq: Four times a day (QID) | INTRAMUSCULAR | Status: DC | PRN

## 2015-04-30 MED ORDER — SODIUM CHLORIDE 0.9 % IJ SOLN: 3.0000 mL | INTRAMUSCULAR | Status: DC | PRN

## 2015-04-30 MED ORDER — ONDANSETRON HCL 4 MG PO TABS: 4.0000 mg | ORAL_TABLET | Freq: Four times a day (QID) | ORAL | Status: DC | PRN

## 2015-04-30 MED ORDER — ASPIRIN 81 MG PO TABS: 81.0000 mg | ORAL_TABLET | Freq: Every day | ORAL | Status: DC

## 2015-04-30 MED ORDER — MULTIPLE VITAMIN PO TABS: ORAL_TABLET | Freq: Every day | ORAL | Status: DC

## 2015-04-30 MED ORDER — STROKE: EARLY STAGES OF RECOVERY BOOK
Freq: Once | Status: AC
  Administered 2015-04-30: 21:00:00

## 2015-04-30 MED ORDER — SODIUM CHLORIDE 0.9 % IV SOLN: 250.0000 mL | INTRAVENOUS | Status: DC | PRN

## 2015-04-30 MED ORDER — ENOXAPARIN SODIUM 40 MG/0.4ML ~~LOC~~ SOLN
40.0000 mg | SUBCUTANEOUS | Status: DC
  Administered 2015-04-30: 40 mg via SUBCUTANEOUS
  Filled 2015-04-30: qty 0.4

## 2015-04-30 MED ORDER — LORATADINE 10 MG PO TABS
10.0000 mg | ORAL_TABLET | Freq: Every day | ORAL | Status: DC
  Administered 2015-05-01: 10:00:00 10 mg via ORAL
  Filled 2015-04-30: qty 1

## 2015-04-30 MED ORDER — PIOGLITAZONE HCL 15 MG PO TABS
15.0000 mg | ORAL_TABLET | Freq: Every day | ORAL | Status: DC
  Administered 2015-05-01: 15 mg via ORAL
  Filled 2015-04-30 (×2): qty 1

## 2015-04-30 MED ORDER — ACETAMINOPHEN 650 MG RE SUPP: 650.0000 mg | Freq: Four times a day (QID) | RECTAL | Status: DC | PRN

## 2015-04-30 MED ORDER — ADULT MULTIVITAMIN W/MINERALS CH
1.0000 | ORAL_TABLET | Freq: Every day | ORAL | Status: DC
  Filled 2015-04-30: qty 1

## 2015-04-30 MED ORDER — SODIUM CHLORIDE 0.9 % IJ SOLN: 3.0000 mL | Freq: Two times a day (BID) | INTRAMUSCULAR | Status: DC

## 2015-04-30 MED ORDER — LOSARTAN POTASSIUM 50 MG PO TABS
100.0000 mg | ORAL_TABLET | Freq: Every day | ORAL | Status: DC
  Administered 2015-05-01: 100 mg via ORAL
  Filled 2015-04-30 (×2): qty 2

## 2015-04-30 MED ORDER — PANTOPRAZOLE SODIUM 40 MG PO TBEC
40.0000 mg | DELAYED_RELEASE_TABLET | Freq: Every day | ORAL | Status: DC
  Administered 2015-05-01: 10:00:00 40 mg via ORAL
  Filled 2015-04-30: qty 1

## 2015-04-30 MED ORDER — ASPIRIN 81 MG PO CHEW
324.0000 mg | CHEWABLE_TABLET | Freq: Once | ORAL | Status: AC
  Administered 2015-04-30: 324 mg via ORAL
  Filled 2015-04-30: qty 4

## 2015-04-30 MED ORDER — FISH OIL BURP-LESS 1200 MG PO CAPS: 2.0000 | ORAL_CAPSULE | Freq: Every day | ORAL | Status: DC

## 2015-04-30 MED ORDER — ASPIRIN EC 81 MG PO TBEC
81.0000 mg | DELAYED_RELEASE_TABLET | Freq: Every day | ORAL | Status: DC
  Filled 2015-04-30: qty 1

## 2015-04-30 MED ORDER — SIMVASTATIN 20 MG PO TABS
20.0000 mg | ORAL_TABLET | Freq: Every day | ORAL | Status: DC
  Administered 2015-04-30: 20 mg via ORAL
  Filled 2015-04-30 (×2): qty 1

## 2015-04-30 MED ORDER — AMLODIPINE BESYLATE 10 MG PO TABS
10.0000 mg | ORAL_TABLET | Freq: Every day | ORAL | Status: DC
  Administered 2015-04-30: 23:00:00 10 mg via ORAL
  Filled 2015-04-30: qty 1

## 2015-04-30 MED ORDER — VITAMIN D3 25 MCG (1000 UNIT) PO TABS
5000.0000 [IU] | ORAL_TABLET | Freq: Every day | ORAL | Status: DC
  Administered 2015-05-01: 5000 [IU] via ORAL
  Filled 2015-04-30 (×2): qty 5

## 2015-04-30 MED ORDER — OMEGA-3-ACID ETHYL ESTERS 1 G PO CAPS
1.0000 g | ORAL_CAPSULE | Freq: Every day | ORAL | Status: DC
  Filled 2015-04-30: qty 1

## 2015-04-30 MED ORDER — SODIUM CHLORIDE 0.9 % IJ SOLN
3.0000 mL | Freq: Two times a day (BID) | INTRAMUSCULAR | Status: DC
  Administered 2015-04-30 – 2015-05-01 (×2): 3 mL via INTRAVENOUS

## 2015-04-30 MED ORDER — NIACIN 500 MG PO TABS
500.0000 mg | ORAL_TABLET | Freq: Every day | ORAL | Status: DC
  Administered 2015-05-01: 500 mg via ORAL
  Filled 2015-04-30 (×2): qty 1

## 2015-04-30 NOTE — ED Notes (Signed)
  Dr. Joni Fears notified of troponin of 0.04.

## 2015-04-30 NOTE — ED Notes (Signed)
Wife states she called him at home at 1130 this am, called him 30 minutes later and he did not remember the first call, later questioned re events this am and patient does not recall. Does know day, name and DOB, grips equal. Alert but does not know what prompted this visit.

## 2015-04-30 NOTE — ED Notes (Signed)
Pt transported to xray 

## 2015-04-30 NOTE — ED Provider Notes (Signed)
[facility] Emergency Department Provider Note  ____________________________________________  Time seen: 2:30 PM  I have reviewed the triage vital signs and the nursing notes.   HISTORY  Chief Complaint Memory Loss    HPI Roberto Leonard is a 74 y.o. male is brought to the ED by his family due to memory impairment. Never had any issues of cognitive or memory dysfunction, no history of dementia, no history of strokes. Last night at bedtime he was totally normal. This morning he was left alone at home while his spouse went out shopping. She asked him to perform some tasks while she was out, and when she called to check on him he indicated that he had absolutely no recollection of that conversation. When family returned home and asked him what he did remember about the morning it turns out that he has isolated no recollection of anything that happened this morning. He does not recall breakfast or meeting with some friends that they had seen or any other details. He currently denies any symptoms including headaches vision changes numbness tingling or weakness.     Past Medical History  Diagnosis Date  . Allergy   . GERD (gastroesophageal reflux disease)   . Hypertension   . Hyperlipidemia   . ED (erectile dysfunction)   . Dupuytren's contracture   . Glaucoma   . Detached retina     RIGHT  . Diabetes mellitus without complication ([place])     ORAL MED     Patient Active Problem List   Diagnosis Date Noted  . Special screening for malignant neoplasms, colon   . Benign neoplasm of sigmoid colon   . Allergic rhinitis 09/24/2014  . 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 Cheilitis 09/24/2014  . Abnormal prostate specific antigen 09/24/2014  . Erectile dysfunction associated with type 2 diabetes mellitus ([place]) 09/24/2014  . 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 Essential (primary) hypertension 09/24/2014  .  Acid reflux 09/24/2014  . Glaucoma 09/24/2014  . Inguinal hernia 09/24/2014  . Hypercholesteremia 09/24/2014  . Detached retina 09/24/2014  . Type 2 diabetes mellitus with diabetic neuropathic arthropathy ([place]) 09/24/2014     Past Surgical History  Procedure Laterality Date  . Cataract extraction      right  . Retinal detachment surgery      right eye, wrong size lense, secondary surgery to replace it  . Hernia repair    . Colonoscopy with propofol N/A 01/03/2015    Procedure: COLONOSCOPY WITH PROPOFOL;  Surgeon: Lucilla Lame, MD;  Location: [place];  Service: Endoscopy;  Laterality: N/A;  DIABETIC-ORAL MEDS  . Polypectomy N/A 01/03/2015    Procedure: POLYPECTOMY;  Surgeon: Lucilla Lame, MD;  Location: [place];  Service: Endoscopy;  Laterality: N/A;  Sigmoid polyp     Current Outpatient Rx  Name  Route  Sig  Dispense  Refill  . amLODipine (NORVASC) 10 MG tablet   Oral   Take 1 tablet (10 mg total) by mouth daily. PM   90 tablet   3   . aspirin 81 MG tablet   Oral   Take 1 tablet by mouth daily.         . Cetirizine HCl 10 MG CAPS   Oral   Take 1 capsule by mouth daily as needed.         . Cholecalciferol ([employer]S VIT D 5000 HIGH-POTENCY) 5000 UNITS capsule   Oral   Take 5,000  Units by mouth daily.         Marland Kitchen losartan (COZAAR) 100 MG tablet   Oral   Take 1 tablet (100 mg total) by mouth daily. AM   90 tablet   3   . Lysine HCl 1000 MG TABS   Oral   Take 1 tablet by mouth daily.         . MULTIPLE VITAMIN PO   Oral   Take 1 tablet by mouth daily.         . niacin 500 MG tablet   Oral   Take 1 tablet by mouth daily.         . Omega-3 Fatty Acids (FISH OIL BURP-LESS) 1200 MG CAPS   Oral   Take 2 capsules by mouth daily.         Marland Kitchen omeprazole (PRILOSEC) 20 MG capsule      TAKE 1 CAPSULE BY MOUTH DAILY   30 capsule   5   . pioglitazone (ACTOS) 15 MG tablet   Oral   Take 1 tablet by mouth daily. AM         . sildenafil  (VIAGRA) 100 MG tablet   Oral   Take 1 tablet by mouth daily as needed.         . simvastatin (ZOCOR) 20 MG tablet      TAKE 1 TABLET BY MOUTH DAILY   90 tablet   3      Allergies Sulfa antibiotics   Family History  Problem Relation Age of Onset  . Heart disease Mother     Died from CHF  . Diabetes Mother   . Diabetes Father   . Heart disease Father     Died from MI  . Hypertension Father   . Hypertension Sister   . Diabetes Sister     Social History Social History  Substance Use Topics  . Smoking status: Former Smoker -- 0.50 packs/day for 2 years    Types: Cigarettes    Quit date: 05/14/1963  . Smokeless tobacco: None  . Alcohol Use: No    Review of Systems  Constitutional:   No fever or chills. No weight changes Eyes:   No blurry vision or double vision.  ENT:   No sore throat. Cardiovascular:   No chest pain. Respiratory:   No dyspnea or cough. Gastrointestinal:   Negative for abdominal pain, vomiting and diarrhea.  No BRBPR or melena. Genitourinary:   Negative for dysuria, urinary retention, bloody urine, or difficulty urinating. Musculoskeletal:   Negative for back pain. No joint swelling or pain. Skin:   Negative for rash. Neurological:   Negative for headaches, focal weakness or numbness. Memory impairment as above Psychiatric:  No anxiety or depression.   Endocrine:  No hot/cold intolerance, changes in energy, or sleep difficulty.  10-point ROS otherwise negative.  ____________________________________________   PHYSICAL EXAM:  VITAL SIGNS: ED Triage Vitals  Enc Vitals Group     BP 04/30/15 1402 189/96 mmHg     Pulse Rate 04/30/15 1402 59     Resp 04/30/15 1402 20     Temp 04/30/15 1402 97.5 F (36.4 C)     Temp Source 04/30/15 1402 Oral     SpO2 04/30/15 1402 98 %     Weight 04/30/15 1402 180 lb (81.647 kg)     Height 04/30/15 1402 6\' 2"  (1.88 m)     Head Cir --      Peak Flow --  Pain Score 04/30/15 1545 0     Pain Loc --       Pain Edu? --      Excl. in [place]? --      Constitutional:   Alert and oriented. Well appearing and in no distress. Eyes:   No scleral icterus. No conjunctival pallor. PERRL. EOMI ENT   Head:   Normocephalic and atraumatic.   Nose:   No congestion/rhinnorhea. No septal hematoma   Mouth/Throat:   MMM, no pharyngeal erythema. No peritonsillar mass. No uvula shift.   Neck:   No stridor. No SubQ emphysema. No meningismus. Hematological/Lymphatic/Immunilogical:   No cervical lymphadenopathy. Cardiovascular:   RRR. Normal and symmetric distal pulses are present in all extremities. No murmurs, rubs, or gallops. Respiratory:   Normal respiratory effort without tachypnea nor retractions. Breath sounds are clear and equal bilaterally. No wheezes/rales/rhonchi. Gastrointestinal:   Soft and nontender. No distention. There is no CVA tenderness.  No rebound, rigidity, or guarding. Genitourinary:   deferred Musculoskeletal:   Nontender with normal range of motion in all extremities. No joint effusions.  No lower extremity tenderness.  No edema. Neurologic:   Normal speech and language.   CN 2-10 normal. Motor grossly intact. Moves all extremities equally with good strength and coordination No pronator drift.  Normal gait. Normal finger to nose Intact immediate 3 object recall of ball, Penny, purple. Can spell "world" backward and forward. Serial 7 count down is as follows: 100, 93, 86, 79, 73, 67..  3 object recall after approximately 2 minutes reveals that the patient does not remember any of the 3 objects, and states that he does not remember me asking him to memorize any objects Skin:    Skin is warm, dry and intact. No rash noted.  No petechiae, purpura, or bullae. Psychiatric:   Mood and affect are normal. Speech and behavior are normal. Patient exhibits appropriate insight and judgment.  ____________________________________________    LABS (pertinent positives/negatives) (all  labs ordered are listed, but only abnormal results are displayed) Labs Reviewed  GLUCOSE, CAPILLARY - Abnormal; Notable for the following:    Glucose-Capillary 103 (*)    All other components within normal limits  BASIC METABOLIC PANEL - Abnormal; Notable for the following:    Glucose, Bld 101 (*)    All other components within normal limits  TROPONIN I - Abnormal; Notable for the following:    Troponin I 0.04 (*)    All other components within normal limits  CBC WITH DIFFERENTIAL/PLATELET - Abnormal; Notable for the following:    Platelets 143 (*)    All other components within normal limits  URINALYSIS COMPLETEWITH MICROSCOPIC ([facility] ONLY) - Abnormal; Notable for the following:    Color, Urine STRAW (*)    APPearance CLEAR (*)    All other components within normal limits   ____________________________________________   EKG  Interpreted by me  Date: 04/30/2015  Rate: 85  Rhythm: normal sinus rhythm  QRS Axis: normal  Intervals: normal  ST/T Wave abnormalities: normal  Conduction Disutrbances: none  Narrative Interpretation: unremarkable      ____________________________________________    RADIOLOGY  CT head unremarkable  ____________________________________________   PROCEDURES   ____________________________________________   INITIAL IMPRESSION / ASSESSMENT AND PLAN / ED COURSE  Pertinent labs & imaging results that were available during my care of the patient were reviewed by me and considered in my medical decision making (see chart for details).  Patient presents with memory impairment which is acute. No other focal  neurologic findings at this time. We'll initiate a stroke workup as well as look for any alternative explanation such as an occult infection. Exam is otherwise unremarkable.  ----------------------------------------- 5:20 PM on 04/30/2015 -----------------------------------------  Workup negative. We'll given aspirin. Case discussed  with neurologist on call by phone who states that this sounds like transient global amnesia. His symptoms have resolved this can be managed like a TIA on an outpatient basis but if he still having the symptoms he should be hospitalized for a stroke workup. On discussing the initial results thus far with the patient and family, the patient interjects multiple times asking "are you saying I had a stroke?" He still has persistent memory impairment even of more new and recent events. Case discussed with the hospitalist for admission.     ____________________________________________   FINAL CLINICAL IMPRESSION(S) / ED DIAGNOSES  Final diagnoses:  Acute memory impairment      Carrie Mew, MD 04/30/15 1721

## 2015-04-30 NOTE — H&P (Signed)
[place] [facility] [place] NAME: Roberto Leonard    MR#:  [id number]  DATE OF BIRTH:  Mar 12, 1941  DATE OF ADMISSION:  04/30/2015  PRIMARY CARE PHYSICIAN: [name], MD   REQUESTING/REFERRING PHYSICIAN:   CHIEF COMPLAINT:   Chief Complaint  Patient presents with  . Memory Loss    HISTORY OF PRESENT ILLNESS: Roberto Leonard  is a 74 y.o. male with a known history of  hypertension, hyperlipidemia, diabetes who presents to the ED with memory loss. Last night at bedtime he was totally normal. This morning he was left alone at home while his spouse went out shopping. She asked him to perform some tasks while she was out, and when she called to check on him he indicated that he had absolutely no recollection of that conversation. When family returned home and asked him what he did remember about the morning it turns out that he he did not recall. Patient also unable to tell me what he did for Thanksgiving dinner. Other than that he has no other symptoms including any weakness in any visual difficulties or swallowing difficulties.   PAST MEDICAL HISTORY:   Past Medical History  Diagnosis Date  . Allergy   . GERD (gastroesophageal reflux disease)   . Hypertension   . Hyperlipidemia   . ED (erectile dysfunction)   . Dupuytren's contracture   . Glaucoma   . Detached retina     RIGHT  . Diabetes mellitus without complication ([place])     ORAL MED    PAST SURGICAL HISTORY:  Past Surgical History  Procedure Laterality Date  . Cataract extraction      right  . Retinal detachment surgery      right eye, wrong size lense, secondary surgery to replace it  . Hernia repair    . Colonoscopy with propofol N/A 01/03/2015    Procedure: COLONOSCOPY WITH PROPOFOL;  Surgeon: Lucilla Lame, MD;  Location: [place];  Service: Endoscopy;  Laterality: N/A;  DIABETIC-ORAL MEDS  . Polypectomy N/A 01/03/2015    Procedure: POLYPECTOMY;  Surgeon:  Lucilla Lame, MD;  Location: [place];  Service: Endoscopy;  Laterality: N/A;  Sigmoid polyp    SOCIAL HISTORY:  Social History  Substance Use Topics  . Smoking status: Former Smoker -- 0.50 packs/day for 2 years    Types: Cigarettes    Quit date: 05/14/1963  . Smokeless tobacco: Not on file  . Alcohol Use: No    FAMILY HISTORY:  Family History  Problem Relation Age of Onset  . Heart disease Mother     Died from CHF  . Diabetes Mother   . Diabetes Father   . Heart disease Father     Died from MI  . Hypertension Father   . Hypertension Sister   . Diabetes Sister     DRUG ALLERGIES:  Allergies  Allergen Reactions  . Sulfa Antibiotics Other (See Comments)    SWEATS    REVIEW OF SYSTEMS:   CONSTITUTIONAL: No fever, fatigue or weakness.  EYES: No blurred or double vision.  EARS, NOSE, AND THROAT: No tinnitus or ear pain.  RESPIRATORY: No cough, shortness of breath, wheezing or hemoptysis.  CARDIOVASCULAR: No chest pain, orthopnea, edema.  GASTROINTESTINAL: No nausea, vomiting, diarrhea or abdominal pain.  GENITOURINARY: No dysuria, hematuria.  ENDOCRINE: No polyuria, nocturia,  HEMATOLOGY: No anemia, easy bruising or bleeding SKIN: No rash or lesion. MUSCULOSKELETAL: No joint pain or arthritis.   NEUROLOGIC:  No tingling, numbness, weakness. Memory loss PSYCHIATRY: No anxiety or depression.   MEDICATIONS AT HOME:  Prior to Admission medications   Medication Sig Start Date End Date Taking? Authorizing Provider  amLODipine (NORVASC) 10 MG tablet Take 1 tablet (10 mg total) by mouth daily. PM 03/21/15  Yes [name]., MD  aspirin 81 MG tablet Take 1 tablet by mouth daily. 03/26/11  Yes Historical Provider, MD  brimonidine (ALPHAGAN) 0.2 % ophthalmic solution Place 1 drop into both eyes 2 (two) times daily. 04/11/15  Yes Historical Provider, MD  Cetirizine HCl 10 MG CAPS Take 10 mg by mouth daily.  03/26/11  Yes Historical Provider, MD   Cholecalciferol (CVS VIT D 5000 HIGH-POTENCY) 5000 UNITS capsule Take 5,000 Units by mouth daily.   Yes Historical Provider, MD  losartan (COZAAR) 100 MG tablet Take 1 tablet (100 mg total) by mouth daily. AM 03/21/15  Yes Richard Maceo Pro., MD  MULTIPLE VITAMIN PO Take 1 tablet by mouth daily. 03/26/11  Yes Historical Provider, MD  niacin 500 MG tablet Take 1 tablet by mouth daily. 03/26/11  Yes Historical Provider, MD  Omega-3 Fatty Acids (FISH OIL BURP-LESS) 1200 MG CAPS Take 2 capsules by mouth daily. 03/26/11  Yes Historical Provider, MD  omeprazole (PRILOSEC) 20 MG capsule TAKE 1 CAPSULE BY MOUTH DAILY 01/27/15  Yes [name]., MD  pioglitazone (ACTOS) 15 MG tablet Take 1 tablet by mouth daily. AM 02/19/14  Yes Historical Provider, MD  sildenafil (VIAGRA) 100 MG tablet Take 1 tablet by mouth daily as needed. 03/26/11  Yes Historical Provider, MD  simvastatin (ZOCOR) 20 MG tablet TAKE 1 TABLET BY MOUTH DAILY 03/21/15  Yes Richard Maceo Pro., MD      PHYSICAL EXAMINATION:   VITAL SIGNS: Blood pressure 164/90, pulse 56, temperature 97.9 F (36.6 C), temperature source Oral, resp. rate 10, height 6\' 2"  (1.88 m), weight 81.647 kg (180 lb), SpO2 96 %.  GENERAL:  74 y.o.-year-old patient lying in the bed with no acute distress.  EYES: Pupils equal, round, reactive to light and accommodation. No scleral icterus. Extraocular muscles intact.  HEENT: Head atraumatic, normocephalic. Oropharynx and nasopharynx clear.  NECK:  Supple, no jugular venous distention. No thyroid enlargement, no tenderness.  LUNGS: Normal breath sounds bilaterally, no wheezing, rales,rhonchi or crepitation. No use of accessory muscles of respiration.  CARDIOVASCULAR: S1, S2 normal. No murmurs, rubs, or gallops.  ABDOMEN: Soft, nontender, nondistended. Bowel sounds present. No organomegaly or mass.  EXTREMITIES: No pedal edema, cyanosis, or clubbing.  NEUROLOGIC: Cranial nerves II through XII are intact.  Muscle strength 5/5 in all extremities. Sensation intact. Gait not checked.  PSYCHIATRIC: The patient is alert and oriented x 3. He is unable to recall recent events. SKIN: No obvious rash, lesion, or ulcer.   LABORATORY PANEL:   CBC  Recent Labs Lab 04/30/15 1505  WBC 9.1  HGB 15.6  HCT 47.8  PLT 143*  MCV 87.4  MCH 28.6  MCHC 32.7  RDW 13.4  LYMPHSABS 1.9  MONOABS 0.8  EOSABS 0.1  BASOSABS 0.0   ------------------------------------------------------------------------------------------------------------------  Chemistries   Recent Labs Lab 04/30/15 1505  NA 142  K 3.7  CL 107  CO2 26  GLUCOSE 101*  BUN 15  CREATININE 0.76  CALCIUM 9.0   ------------------------------------------------------------------------------------------------------------------ estimated creatinine clearance is 92.1 mL/min (by C-G formula based on Cr of 0.76). ------------------------------------------------------------------------------------------------------------------ No results for input(s): TSH, T4TOTAL, T3FREE, THYROIDAB in the last 72 hours.  Invalid input(s):  FREET3   Coagulation profile No results for input(s): INR, PROTIME in the last 168 hours. ------------------------------------------------------------------------------------------------------------------- No results for input(s): DDIMER in the last 72 hours. -------------------------------------------------------------------------------------------------------------------  Cardiac Enzymes  Recent Labs Lab 04/30/15 1505  TROPONINI 0.04*   ------------------------------------------------------------------------------------------------------------------ Invalid input(s): POCBNP  ---------------------------------------------------------------------------------------------------------------  Urinalysis    Component Value Date/Time   COLORURINE STRAW* 04/30/2015 1505   APPEARANCEUR CLEAR* 04/30/2015 1505   LABSPEC  1.010 04/30/2015 1505   PHURINE 7.0 04/30/2015 1505   GLUCOSEU NEGATIVE 04/30/2015 1505   HGBUR NEGATIVE 04/30/2015 1505   BILIRUBINUR NEGATIVE 04/30/2015 1505   KETONESUR NEGATIVE 04/30/2015 1505   PROTEINUR NEGATIVE 04/30/2015 1505   NITRITE NEGATIVE 04/30/2015 1505   LEUKOCYTESUR NEGATIVE 04/30/2015 1505     RADIOLOGY: Dg Chest 2 View  04/30/2015  CLINICAL DATA:  Altered mental status and memory loss. EXAM: CHEST  2 VIEW COMPARISON:  None. FINDINGS: The cardiomediastinal silhouette is unremarkable. Mild elevation of the right hemidiaphragm is noted. There is no evidence of focal airspace disease, pulmonary edema, suspicious pulmonary nodule/mass, pleural effusion, or pneumothorax. No acute bony abnormalities are identified. IMPRESSION: No active cardiopulmonary disease. Electronically Signed   By: Margarette Canada M.D.   On: 04/30/2015 15:26   Ct Head Wo Contrast  04/30/2015  CLINICAL DATA:  Altered mental status EXAM: CT HEAD WITHOUT CONTRAST TECHNIQUE: Contiguous axial images were obtained from the base of the skull through the vertex without intravenous contrast. COMPARISON:  None. FINDINGS: There is no mass effect, midline shift, or acute intracranial hemorrhage. Mild global atrophy. Brain parenchyma is otherwise within normal limits. Mastoid air cells are clear. Cranium is intact. IMPRESSION: No acute intracranial pathology. Electronically Signed   By: Marybelle Killings M.D.   On: 04/30/2015 15:06    EKG: Orders placed or performed during the hospital encounter of 04/30/15  . EKG 12-Lead  . EKG 12-Lead    IMPRESSION AND PLAN: Patient is a 74 year old white male presents with memory loss  1. Acute memory loss. The ED physician has spoken to the neurologist on call. Patient may have had a stroke. Leading to global amnesia. At this point I will admit the patient obtain MRI of the brain. Carotid Dopplers and echocardiogram of the heart. He will be placed on a telemetry. He'll be continued on  aspirin. If he does have MRI that shows a stroke h his antiplatelets therapy will need to be reevaluated. Neurology consult will be obtained.  2. Hypertension allow permissive hypertension with his stroke like symptoms. Continue losartan  3. Diabetes type 2 place him on sliding scale insulin continue her oral regimen with Actos as at home  4. hyper lipidemia continue niacin, simvastin  5. Misc: lovenox    All the records are reviewed and case discussed with ED provider. Management plans discussed with the patient, family and they are in agreement.  CODE STATUS:full     TOTAL TIME TAKING CARE OF THIS PATIENT: 45 minutes.    Dustin Flock M.D on 04/30/2015 at 6:00 PM  Between 7am to 6pm - Pager - [phone]  After 6pm go to [email or web address] - password [email or web address] [place] [facility]  Office  [phone]  CC: Primary care physician; [name], MD

## 2015-04-30 NOTE — Care Management Obs Status (Signed)
[facility] NOTIFICATION   Patient Details  Name: [name] MRN: [id number] Date of Birth: [birth year]   Medicare Observation Status Notification Given:  Yes (reviewed with patient and family)    Ival Bible, RN 04/30/2015, 6:09 PM

## 2015-05-01 ENCOUNTER — Observation Stay: Payer: PPO

## 2015-05-01 ENCOUNTER — Observation Stay
Admit: 2015-05-01 | Discharge: 2015-05-01 | Disposition: A | Discharge status: Home / Self Care | Payer: PPO | Attending: Internal Medicine | Admitting: Internal Medicine

## 2015-05-01 DIAGNOSIS — G454 Transient global amnesia: Secondary | ICD-10-CM

## 2015-05-01 DIAGNOSIS — R413 Other amnesia: Secondary | ICD-10-CM

## 2015-05-01 LAB — BASIC METABOLIC PANEL
ANION GAP: 5 (ref 5–15)
BUN: 12 mg/dL (ref 6–20)
CHLORIDE: 110 mmol/L (ref 101–111)
CO2: 27 mmol/L (ref 22–32)
Calcium: 8.9 mg/dL (ref 8.9–10.3)
Creatinine, Ser: 0.62 mg/dL (ref 0.61–1.24)
GFR calc non Af Amer: >60 mL/min (ref >60)
Glucose, Bld: 107 mg/dL — ABNORMAL HIGH (ref 65–99)
Potassium: 3.6 mmol/L (ref 3.5–5.1)
Sodium: 142 mmol/L (ref 135–145)

## 2015-05-01 LAB — LIPID PANEL
Cholesterol: 140 mg/dL (ref 0–200)
HDL: 39 mg/dL — AB (ref >40)
LDL CALC: 77 mg/dL (ref 0–99)
TRIGLYCERIDES: 120 mg/dL (ref <150)
Total CHOL/HDL Ratio: 3.6 RATIO
VLDL: 24 mg/dL (ref 0–40)

## 2015-05-01 LAB — CBC
HEMATOCRIT: 44.6 % (ref 40.0–52.0)
Hemoglobin: 14.5 g/dL (ref 13.0–18.0)
MCH: 28.3 pg (ref 26.0–34.0)
MCHC: 32.5 g/dL (ref 32.0–36.0)
MCV: 87.1 fL (ref 80.0–100.0)
Platelets: 145 10*3/uL — ABNORMAL LOW (ref 150–440)
RBC: 5.12 MIL/uL (ref 4.40–5.90)
RDW: 13.2 % (ref 11.5–14.5)
WBC: 6.9 10*3/uL (ref 3.8–10.6)

## 2015-05-01 LAB — HEMOGLOBIN A1C: Hgb A1c MFr Bld: 5.9 % (ref 4.0–6.0)

## 2015-05-01 LAB — TROPONIN I: Troponin I: 0.04 ng/mL — ABNORMAL HIGH (ref <0.031)

## 2015-05-01 MED ORDER — SIMVASTATIN 20 MG PO TABS
20.0000 mg | ORAL_TABLET | Freq: Every day | ORAL | Status: DC
Start: 2015-05-01 — End: 2015-05-01

## 2015-05-01 MED ORDER — ADULT MULTIVITAMIN W/MINERALS CH: 1.0000 | ORAL_TABLET | Freq: Every day | ORAL | Status: DC

## 2015-05-01 MED ORDER — ASPIRIN EC 81 MG PO TBEC: 81.0000 mg | DELAYED_RELEASE_TABLET | Freq: Every day | ORAL | Status: DC

## 2015-05-01 MED ORDER — OMEGA-3-ACID ETHYL ESTERS 1 G PO CAPS: 1.0000 g | ORAL_CAPSULE | Freq: Every day | ORAL | Status: DC

## 2015-05-01 MED ORDER — ASPIRIN EC 325 MG PO TBEC: 325.0000 mg | DELAYED_RELEASE_TABLET | Freq: Every day | ORAL | Status: DC

## 2015-05-01 NOTE — Evaluation (Signed)
  Occupational Therapy Evaluation Patient Details Name: ARIAN BUTTERS MRN: [id number] DOB: 1940-09-10 Today's Date: 26-May-2015    History of Present Illness  Pt is 74 year old male who came into hospital with global amnesia and worked up for possible CVA.  Received order for OT eval and treat.   Clinical Impression   Chart reviewed and patient seen after talking to NSG who indicated he was at baseline and did not have any OT needs.  Screen completed and no OT eval or treatment needed for patient and will sign off.      Follow Up Recommendations       Equipment Recommendations       Recommendations for Other Services       Precautions / Restrictions        Mobility Bed Mobility                  Transfers                      Balance                                            ADL                                               Vision     Perception     Praxis      Pertinent Vitals/Pain       Hand Dominance     Extremity/Trunk Assessment             Communication     Cognition                           General Comments       Exercises       Shoulder Instructions      Home Living                                          Prior Functioning/Environment               OT Diagnosis:     OT Problem List:     OT Treatment/Interventions:      OT Goals(Current goals can be found in the care plan section)    OT Frequency:     Barriers to D/C:            Co-evaluation              End of Session    Activity Tolerance:   Patient left:     Time: 1125-1140 OT Time Calculation (min): 15 min Charges:  OT Treatments $Self Care/Home Management : 8-22 mins G-Codes:    Pamla Pangle 05/26/15, 11:44 AM    Chrys Racer, OTR/L aascom [phone]

## 2015-05-01 NOTE — Care Management Note (Signed)
Case Management Note  Patient Details  Name: Roberto Leonard MRN: [id number] Date of Birth: 13-Dec-1940  Subjective/Objective:    Per their notes no OT or PT needs. Discharge home with no home health.                 Action/Plan:   Expected Discharge Date:                  Expected Discharge Plan:     In-House Referral:     Discharge planning Services     Post Acute Care Choice:    Choice offered to:     DME Arranged:    DME Agency:     HH Arranged:    [place] Agency:     Status of Service:     Medicare Important Message Given:    Date Medicare IM Given:    Medicare IM give by:    Date Additional Medicare IM Given:    Additional Medicare Important Message give by:     If discussed at [place] of Stay Meetings, dates discussed:    Additional Comments:  Saraih Lorton A, RN 05/01/2015, 1:24 PM

## 2015-05-01 NOTE — Progress Notes (Deleted)
OT Cancellation Note  Patient Details Name: Roberto Leonard MRN: [id number] DOB: January 13, 1941   Cancelled Treatment:    Reason Eval/Treat Not Completed: OT screened, no needs identified, will sign off.  Pt is at baseline for ADLs and has no OT needs at this time for full evaluation.  Please re-consult if status changes.  Thank you for the referral.  Harlon Ditty, OTR/L ascom [phone] 05/01/2015, 11:43 AM

## 2015-05-01 NOTE — Plan of Care (Signed)
Problem: Education: Goal: Knowledge of disease or condition will improve Outcome: Progressing Pt is alert and oriented, no c/o pain. NIH of 0. Patient has evident short term memory loss.  High Fall Risk, encouraged to call for assistance. Family at bedside. Stroke booklet education given, family and patient receptive. Q2 Neuro checks with vital signs, stable.

## 2015-05-01 NOTE — Discharge Summary (Signed)
[facility] NAME: Roberto Leonard    MR#:  [id number]  DATE OF BIRTH:  June 10, 1940  DATE OF ADMISSION:  04/30/2015 ADMITTING PHYSICIAN: Dustin Flock, MD  DATE OF DISCHARGE: 05/01/2015  1:33 PM  PRIMARY CARE PHYSICIAN: [name], MD    ADMISSION DIAGNOSIS:  Memory loss [R41.3] CVA (cerebral infarction) [I63.9] Acute memory impairment [R41.3]  DISCHARGE DIAGNOSIS:  Active Problems:   Memory loss   SECONDARY DIAGNOSIS:   Past Medical History  Diagnosis Date  . Allergy   . GERD (gastroesophageal reflux disease)   . Hypertension   . Hyperlipidemia   . ED (erectile dysfunction)   . Dupuytren's contracture   . Glaucoma   . Detached retina     RIGHT  . Diabetes mellitus without complication ([place])     ORAL MED    HOSPITAL COURSE:   74 year old male with past medical history of GERD, hypertension, hyperlipidemia, glaucoma, diabetes type 2 without complication, who presented to the hospital due to amnesia and forget fullness.  #1 altered mental status/amnesia-patient was admitted to the hospital for possible workup of stroke. He underwent a CT scan of his head and a MRI of his brain which were essentially normal showing no evidence of acute CVA. His amnesia has completely resolved now and he is back to baseline. 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 -There recommended increasing his aspirin to a full dose and discharge home. The family was okay with this plan.  #2 Glaucoma - patient will resume his brimonidine, Dorzolamide/Timolol eyedrops.  #3 HTN - he will continue his losartan, Norvasc.  #4 DM - pt. Will resume his Actos  #5 Hyperlipidemia - pt. Will resume his Simvastatin.   DISCHARGE CONDITIONS:   Stable  CONSULTS OBTAINED:  Treatment Team:  Dustin Flock, MD Leotis Pain,  MD  DRUG ALLERGIES:   Allergies  Allergen Reactions  . Sulfa Antibiotics Other (See Comments)    SWEATS, bad headache    DISCHARGE MEDICATIONS:   Discharge Medication List as of 05/01/2015  1:25 PM    START taking these medications   Details  aspirin EC 325 MG tablet Take 1 tablet (325 mg total) by mouth daily., Starting 05/01/2015, Until Discontinued, Print      CONTINUE these medications which have NOT CHANGED   Details  amLODipine (NORVASC) 10 MG tablet Take 1 tablet (10 mg total) by mouth daily. PM, Starting 03/21/2015, Until Discontinued, Normal    brimonidine (ALPHAGAN) 0.2 % ophthalmic solution Place 1 drop into both eyes 2 (two) times daily., Starting 04/11/2015, Until Discontinued, Historical Med    Cetirizine HCl 10 MG CAPS Take 10 mg by mouth daily. , Starting 03/26/2011, Until Discontinued, Historical Med    Cholecalciferol ([employer] VIT D 5000 HIGH-POTENCY) 5000 UNITS capsule Take 5,000 Units by mouth daily., Until Discontinued, Historical Med    dorzolamide-timolol (COSOPT) 22.3-6.8 MG/ML ophthalmic solution Place 1 drop into both eyes 2 (two) times daily., Until Discontinued, Historical Med    losartan (COZAAR) 100 MG tablet Take 1 tablet (100 mg total) by mouth daily. AM, Starting 03/21/2015, Until Discontinued, Normal    MULTIPLE VITAMIN PO Take 1 tablet by mouth daily., Starting 03/26/2011, Until Discontinued, Historical Med    niacin 500 MG tablet Take 1 tablet by mouth daily., Starting 03/26/2011, Until Discontinued, Historical Med    Omega-3 Fatty  Acids (FISH OIL BURP-LESS) 1200 MG CAPS Take 2 capsules by mouth daily., Starting 03/26/2011, Until Discontinued, Historical Med    omeprazole (PRILOSEC) 20 MG capsule TAKE 1 CAPSULE BY MOUTH DAILY, Normal    pioglitazone (ACTOS) 15 MG tablet Take 1 tablet by mouth daily. AM, Starting 02/19/2014, Until Discontinued, Historical Med    simvastatin (ZOCOR) 20 MG tablet TAKE 1 TABLET BY MOUTH DAILY, Normal    sildenafil  (VIAGRA) 100 MG tablet Take 1 tablet by mouth daily as needed. Reported on 04/30/2015, Starting 03/26/2011, Until Discontinued, Historical Med      STOP taking these medications     aspirin 81 MG tablet          DISCHARGE INSTRUCTIONS:   DIET:  Cardiac diet and Diabetic diet  DISCHARGE CONDITION:  Stable  ACTIVITY:  Activity as tolerated  OXYGEN:  Home Oxygen: No.   Oxygen Delivery: room air  DISCHARGE LOCATION:  home   If you experience worsening of your admission symptoms, develop shortness of breath, life threatening emergency, suicidal or homicidal thoughts you must seek medical attention immediately by calling 911 or calling your MD immediately  if symptoms less severe.  You Must read complete instructions/literature along with all the possible adverse reactions/side effects for all the Medicines you take and that have been prescribed to you. Take any new Medicines after you have completely understood and accpet all the possible adverse reactions/side effects.   Please note  You were cared for by a hospitalist during your hospital stay. If you have any questions about your discharge medications or the care you received while you were in the hospital after you are discharged, you can call the unit and asked to speak with the hospitalist on call if the hospitalist that took care of you is not available. Once you are discharged, your primary care physician will handle any further medical issues. Please note that NO REFILLS for any discharge medications will be authorized once you are discharged, as it is imperative that you return to your primary care physician (or establish a relationship with a primary care physician if you do not have one) for your aftercare needs so that they can reassess your need for medications and monitor your lab values.     Today   Mental status back to baseline. No further amnesia. Family at bedside. No focal complaints presently.  VITAL  SIGNS:  Blood pressure 143/81, pulse 62, temperature 97.9 F (36.6 C), temperature source Oral, resp. rate 12, height 5\' 8"  (1.727 m), weight 92.352 kg (203 lb 9.6 oz), SpO2 95 %.  I/O:   Intake/Output Summary (Last 24 hours) at 05/01/15 1532 Last data filed at 05/01/15 0900  Gross per 24 hour  Intake    120 ml  Output      0 ml  Net    120 ml    PHYSICAL EXAMINATION:  GENERAL:  74 y.o.-year-old patient lying in the bed with no acute distress.  EYES: Pupils equal, round, reactive to light and accommodation. No scleral icterus. Extraocular muscles intact.  HEENT: Head atraumatic, normocephalic. Oropharynx and nasopharynx clear.  NECK:  Supple, no jugular venous distention. No thyroid enlargement, no tenderness.  LUNGS: Normal breath sounds bilaterally, no wheezing, rales,rhonchi. No use of accessory muscles of respiration.  CARDIOVASCULAR: S1, S2 normal. No murmurs, rubs, or gallops.  ABDOMEN: Soft, non-tender, non-distended. Bowel sounds present. No organomegaly or mass.  EXTREMITIES: No pedal edema, cyanosis, or clubbing.  NEUROLOGIC: Cranial nerves II through XII are  intact. No focal motor or sensory defecits b/l.  PSYCHIATRIC: The patient is alert and oriented x 3. Good affect.  SKIN: No obvious rash, lesion, or ulcer.   DATA REVIEW:   CBC  Recent Labs Lab 05/01/15 0342  WBC 6.9  HGB 14.5  HCT 44.6  PLT 145*    Chemistries   Recent Labs Lab 05/01/15 0342  NA 142  K 3.6  CL 110  CO2 27  GLUCOSE 107*  BUN 12  CREATININE 0.62  CALCIUM 8.9    Cardiac Enzymes  Recent Labs Lab 05/01/15 0342  TROPONINI 0.04*    Microbiology Results  No results found for this or any previous visit.  RADIOLOGY:  Dg Chest 2 View  04/30/2015  CLINICAL DATA:  Altered mental status and memory loss. EXAM: CHEST  2 VIEW COMPARISON:  None. FINDINGS: The cardiomediastinal silhouette is unremarkable. Mild elevation of the right hemidiaphragm is noted. There is no evidence of  focal airspace disease, pulmonary edema, suspicious pulmonary nodule/mass, pleural effusion, or pneumothorax. No acute bony abnormalities are identified. IMPRESSION: No active cardiopulmonary disease. Electronically Signed   By: Margarette Canada M.D.   On: 04/30/2015 15:26   Ct Head Wo Contrast  04/30/2015  CLINICAL DATA:  Altered mental status EXAM: CT HEAD WITHOUT CONTRAST TECHNIQUE: Contiguous axial images were obtained from the base of the skull through the vertex without intravenous contrast. COMPARISON:  None. FINDINGS: There is no mass effect, midline shift, or acute intracranial hemorrhage. Mild global atrophy. Brain parenchyma is otherwise within normal limits. Mastoid air cells are clear. Cranium is intact. IMPRESSION: No acute intracranial pathology. Electronically Signed   By: Marybelle Killings M.D.   On: 04/30/2015 15:06   Mr Brain Wo Contrast  05/01/2015  CLINICAL DATA:  Acute onset of memory loss yesterday. EXAM: MRI HEAD WITHOUT CONTRAST TECHNIQUE: Multiplanar, multiecho pulse sequences of the brain and surrounding structures were obtained without intravenous contrast. COMPARISON:  CT head without contrast 04/30/2015 FINDINGS: The diffusion-weighted images demonstrate no evidence for acute or subacute infarction to explain the patient's symptoms. Mild periventricular and scattered subcortical T2 changes bilaterally are slightly greater than expected for age. No acute hemorrhage or mass lesion is present. The ventricles are of normal size. Insert pass fluid The internal auditory canals are within normal limits bilaterally. The brainstem is within normal limits. The cerebellum is unremarkable. Flow is present in the major intracranial arteries. A right lens replacement is present. The globes and orbits are otherwise intact. The paranasal sinuses and mastoid air cells are clear. The skullbase is within normal limits. Midline sagittal images are unremarkable. IMPRESSION: 1. No acute intracranial  abnormality. 2. Mild periventricular and subcortical T2 changes bilaterally are slightly greater than expected for age. These likely reflect the sequela of chronic microvascular ischemia. Electronically Signed   By: San Morelle M.D.   On: 05/01/2015 10:43   US Carotid Bilateral  05/01/2015  CLINICAL DATA:  Memory loss EXAM: BILATERAL CAROTID DUPLEX ULTRASOUND TECHNIQUE: Pearline Cables scale imaging, color Doppler and duplex ultrasound were performed of bilateral carotid and vertebral arteries in the neck. COMPARISON:  None. FINDINGS: Criteria: Quantification of carotid stenosis is based on velocity parameters that correlate the residual internal carotid diameter with NASCET-based stenosis levels, using the diameter of the distal internal carotid lumen as the denominator for stenosis measurement. The following velocity measurements were obtained: RIGHT ICA:  69 cm/sec CCA:  99991111 cm/sec SYSTOLIC ICA/CCA RATIO:  0.7 DIASTOLIC ICA/CCA RATIO:  1.2 ECA:  62 cm/sec LEFT ICA:  82 cm/sec CCA:  72 cm/sec SYSTOLIC ICA/CCA RATIO:  0.9 DIASTOLIC ICA/CCA RATIO:  1.8 ECA:  51 cm/sec RIGHT CAROTID ARTERY: Little if any plaque in the bulb. Low resistance internal carotid Doppler pattern. RIGHT VERTEBRAL ARTERY:  Antegrade with a normal Doppler pattern. LEFT CAROTID ARTERY: Little if any plaque in the bulb. Low resistance internal carotid Doppler pattern. LEFT VERTEBRAL ARTERY:  Antegrade with a normal Doppler pattern. IMPRESSION: Less than 50% stenosis in the right and left internal carotid arteries. Electronically Signed   By: Marybelle Killings M.D.   On: 05/01/2015 12:48      Management plans discussed with the patient, family and they are in agreement.  CODE STATUS:     Code Status Orders        Start     Ordered   04/30/15 2016  Full code   Continuous     04/30/15 2016      TOTAL TIME TAKING CARE OF THIS PATIENT: 40 minutes.    Henreitta Leber M.D on 05/01/2015 at 3:32 PM  Between 7am to 6pm - Pager -  [phone]  After 6pm go to [email or web address] - password [email or web address] [facility]  Office  [phone]  CC: Primary care physician; [name], MD

## 2015-05-01 NOTE — Progress Notes (Signed)
PT Attempt Note  Patient Details Name: Roberto Leonard MRN: [id number] DOB: [birth year]   Cancelled Treatment:    Reason Eval/Treat Not Completed: Patient at procedure or test/unavailable. Chart reviewed and RN consulted. Pt currently out of room for diagnostic imaging. Per RN pt has not no deficits in his mobility. He ambulates independently in room and has no PT needs. Will attempt evaluation at later time/date however it appears that pt does not have needs at this time.   Lyndel Safe Huprich PT, DPT   Huprich,Jason 05/01/2015, 11:07 AM

## 2015-05-01 NOTE — Consult Note (Signed)
CC: confusion  HPI: Roberto Leonard is an 74 y.o. male  with a known history of hypertension, hyperlipidemia, diabetes who presents to the ED with memory loss. Last night at bedtime he was totally normal. Now back to baseline. Family giving the story.   Past Medical History  Diagnosis Date  . Allergy   . GERD (gastroesophageal reflux disease)   . Hypertension   . Hyperlipidemia   . ED (erectile dysfunction)   . Dupuytren's contracture   . Glaucoma   . Detached retina     RIGHT  . Diabetes mellitus without complication ([place])     ORAL MED    Past Surgical History  Procedure Laterality Date  . Cataract extraction      right  . Retinal detachment surgery      right eye, wrong size lense, secondary surgery to replace it  . Hernia repair    . Colonoscopy with propofol N/A 01/03/2015    Procedure: COLONOSCOPY WITH PROPOFOL;  Surgeon: Lucilla Lame, MD;  Location: [facility];  Service: Endoscopy;  Laterality: N/A;  DIABETIC-ORAL MEDS  . Polypectomy N/A 01/03/2015    Procedure: POLYPECTOMY;  Surgeon: Lucilla Lame, MD;  Location: [facility];  Service: Endoscopy;  Laterality: N/A;  Sigmoid polyp    Family History  Problem Relation Age of Onset  . Heart disease Mother     Died from CHF  . Diabetes Mother   . Diabetes Father   . Heart disease Father     Died from MI  . Hypertension Father   . Hypertension Sister   . Diabetes Sister     Social History:  reports that he quit smoking about 52 years ago. His smoking use included Cigarettes. He has a 1 pack-year smoking history. He does not have any smokeless tobacco history on file. He reports that he does not drink alcohol. His drug history is not on file.  Allergies  Allergen Reactions  . Sulfa Antibiotics Other (See Comments)    SWEATS, bad headache    Medications: I have reviewed the patient's current medications.  ROS: History obtained from the patient  General ROS: negative for - chills, fatigue,  fever, night sweats, weight gain or weight loss Psychological ROS: negative for - behavioral disorder, hallucinations, memory difficulties, mood swings or suicidal ideation Ophthalmic ROS: negative for - blurry vision, double vision, eye pain or loss of vision ENT ROS: negative for - epistaxis, nasal discharge, oral lesions, sore throat, tinnitus or vertigo Allergy and Immunology ROS: negative for - hives or itchy/watery eyes Hematological and Lymphatic ROS: negative for - bleeding problems, bruising or swollen lymph nodes Endocrine ROS: negative for - galactorrhea, hair pattern changes, polydipsia/polyuria or temperature intolerance Respiratory ROS: negative for - cough, hemoptysis, shortness of breath or wheezing Cardiovascular ROS: negative for - chest pain, dyspnea on exertion, edema or irregular heartbeat Gastrointestinal ROS: negative for - abdominal pain, diarrhea, hematemesis, nausea/vomiting or stool incontinence Genito-Urinary ROS: negative for - dysuria, hematuria, incontinence or urinary frequency/urgency Musculoskeletal ROS: negative for - joint swelling or muscular weakness Neurological ROS: as noted in HPI Dermatological ROS: negative for rash and skin lesion changes  Physical Examination: Blood pressure 143/81, pulse 62, temperature 97.9 F (36.6 C), temperature source Oral, resp. rate 12, height 5\' 8"  (1.727 m), weight 203 lb 9.6 oz (92.352 kg), SpO2 95 %.   Mental Status: Alert, oriented, thought content appropriate.  Speech fluent without evidence of aphasia.  Able to follow 3 step commands without difficulty. Cranial  Nerves: II: Discs flat bilaterally; Visual fields grossly normal, pupils equal, round, reactive to light and accommodation III,IV, VI: ptosis not present, extra-ocular motions intact bilaterally V,VII: smile symmetric, facial light touch sensation normal bilaterally VIII: hearing normal bilaterally IX,X: gag reflex present XI: bilateral shoulder  shrug XII: midline tongue extension Motor: Right : Upper extremity   5/5    Left:     Upper extremity   5/5  Lower extremity   5/5     Lower extremity   5/5 Tone and bulk:normal tone throughout; no atrophy noted Sensory: Pinprick and light touch intact throughout, bilaterally Deep Tendon Reflexes: 2+ and symmetric throughout Plantars: Right: downgoing   Left: downgoing Cerebellar: normal finger-to-nose, normal rapid alternating movements and normal heel-to-shin test Gait: not tested      Laboratory Studies:   Basic Metabolic Panel:  Recent Labs Lab 04/30/15 1505 05/01/15 0342  NA 142 142  K 3.7 3.6  CL 107 110  CO2 26 27  GLUCOSE 101* 107*  BUN 15 12  CREATININE 0.76 0.62  CALCIUM 9.0 8.9    Liver Function Tests: No results for input(s): AST, ALT, ALKPHOS, BILITOT, PROT, ALBUMIN in the last 168 hours. No results for input(s): LIPASE, AMYLASE in the last 168 hours. No results for input(s): AMMONIA in the last 168 hours.  CBC:  Recent Labs Lab 04/30/15 1505 05/01/15 0342  WBC 9.1 6.9  NEUTROABS 6.3  --   HGB 15.6 14.5  HCT 47.8 44.6  MCV 87.4 87.1  PLT 143* 145*    Cardiac Enzymes:  Recent Labs Lab 04/30/15 1505 04/30/15 2144 05/01/15 0342  TROPONINI 0.04* 0.04* 0.04*    BNP: Invalid input(s): POCBNP  CBG:  Recent Labs Lab 04/30/15 1409 04/30/15 2154  GLUCAP 103* 148*    Microbiology: No results found for this or any previous visit.  Coagulation Studies: No results for input(s): LABPROT, INR in the last 72 hours.  Urinalysis:  Recent Labs Lab 04/30/15 1505  COLORURINE STRAW*  LABSPEC 1.010  PHURINE 7.0  GLUCOSEU NEGATIVE  HGBUR NEGATIVE  BILIRUBINUR NEGATIVE  KETONESUR NEGATIVE  PROTEINUR NEGATIVE  NITRITE NEGATIVE  LEUKOCYTESUR NEGATIVE    Lipid Panel:     Component Value Date/Time   CHOL 140 05/01/2015 0342   CHOL 139 11/05/2014 0820   TRIG 120 05/01/2015 0342   HDL 39* 05/01/2015 0342   HDL 40 11/05/2014 0820    CHOLHDL 3.6 05/01/2015 0342   VLDL 24 05/01/2015 0342   LDLCALC 77 05/01/2015 0342   LDLCALC 77 11/05/2014 0820    HgbA1C:  Lab Results  Component Value Date   HGBA1C 6.0 02/10/2015    Urine Drug Screen:  No results found for: LABOPIA, COCAINSCRNUR, LABBENZ, AMPHETMU, THCU, LABBARB  Alcohol Level: No results for input(s): ETH in the last 168 hours.  Other results: EKG: normal EKG, normal sinus rhythm, unchanged from previous tracings.  Imaging: Dg Chest 2 View  04/30/2015  CLINICAL DATA:  Altered mental status and memory loss. EXAM: CHEST  2 VIEW COMPARISON:  None. FINDINGS: The cardiomediastinal silhouette is unremarkable. Mild elevation of the right hemidiaphragm is noted. There is no evidence of focal airspace disease, pulmonary edema, suspicious pulmonary nodule/mass, pleural effusion, or pneumothorax. No acute bony abnormalities are identified. IMPRESSION: No active cardiopulmonary disease. Electronically Signed   By: Margarette Canada M.D.   On: 04/30/2015 15:26   Ct Head Wo Contrast  04/30/2015  CLINICAL DATA:  Altered mental status EXAM: CT HEAD WITHOUT CONTRAST TECHNIQUE: Contiguous axial images  were obtained from the base of the skull through the vertex without intravenous contrast. COMPARISON:  None. FINDINGS: There is no mass effect, midline shift, or acute intracranial hemorrhage. Mild global atrophy. Brain parenchyma is otherwise within normal limits. Mastoid air cells are clear. Cranium is intact. IMPRESSION: No acute intracranial pathology. Electronically Signed   By: Marybelle Killings M.D.   On: 04/30/2015 15:06   Mr Brain Wo Contrast  05/01/2015  CLINICAL DATA:  Acute onset of memory loss yesterday. EXAM: MRI HEAD WITHOUT CONTRAST TECHNIQUE: Multiplanar, multiecho pulse sequences of the brain and surrounding structures were obtained without intravenous contrast. COMPARISON:  CT head without contrast 04/30/2015 FINDINGS: The diffusion-weighted images demonstrate no evidence for  acute or subacute infarction to explain the patient's symptoms. Mild periventricular and scattered subcortical T2 changes bilaterally are slightly greater than expected for age. No acute hemorrhage or mass lesion is present. The ventricles are of normal size. Insert pass fluid The internal auditory canals are within normal limits bilaterally. The brainstem is within normal limits. The cerebellum is unremarkable. Flow is present in the major intracranial arteries. A right lens replacement is present. The globes and orbits are otherwise intact. The paranasal sinuses and mastoid air cells are clear. The skullbase is within normal limits. Midline sagittal images are unremarkable. IMPRESSION: 1. No acute intracranial abnormality. 2. Mild periventricular and subcortical T2 changes bilaterally are slightly greater than expected for age. These likely reflect the sequela of chronic microvascular ischemia. Electronically Signed   By: San Morelle M.D.   On: 05/01/2015 10:43   US Carotid Bilateral  05/01/2015  CLINICAL DATA:  Memory loss EXAM: BILATERAL CAROTID DUPLEX ULTRASOUND TECHNIQUE: Pearline Cables scale imaging, color Doppler and duplex ultrasound were performed of bilateral carotid and vertebral arteries in the neck. COMPARISON:  None. FINDINGS: Criteria: Quantification of carotid stenosis is based on velocity parameters that correlate the residual internal carotid diameter with NASCET-based stenosis levels, using the diameter of the distal internal carotid lumen as the denominator for stenosis measurement. The following velocity measurements were obtained: RIGHT ICA:  69 cm/sec CCA:  99991111 cm/sec SYSTOLIC ICA/CCA RATIO:  0.7 DIASTOLIC ICA/CCA RATIO:  1.2 ECA:  62 cm/sec LEFT ICA:  82 cm/sec CCA:  72 cm/sec SYSTOLIC ICA/CCA RATIO:  0.9 DIASTOLIC ICA/CCA RATIO:  1.8 ECA:  51 cm/sec RIGHT CAROTID ARTERY: Little if any plaque in the bulb. Low resistance internal carotid Doppler pattern. RIGHT VERTEBRAL ARTERY:  Antegrade  with a normal Doppler pattern. LEFT CAROTID ARTERY: Little if any plaque in the bulb. Low resistance internal carotid Doppler pattern. LEFT VERTEBRAL ARTERY:  Antegrade with a normal Doppler pattern. IMPRESSION: Less than 50% stenosis in the right and left internal carotid arteries. Electronically Signed   By: Marybelle Killings M.D.   On: 05/01/2015 12:48     Assessment/Plan:   Roberto Leonard is an 74 y.o. male  with a known history of hypertension, hyperlipidemia, diabetes who presents to the ED with memory loss. Last night at bedtime he was totally normal. Now back to baseline. Family giving the story.  MRI brain showing small vessel dz from chronic HTN. 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 Now back to baseline. No abnormalities on carotid US.  - d/c planning today - increase ASA 325 - rest next week - case was discussed with family at bedside.  Leotis Pain  05/01/2015, 1:16 PM

## 2015-05-01 NOTE — Progress Notes (Signed)
MD order received to discharge pt home today; verbally reviewed [facility] discharge instructions with pt including medications/gave Rx for Aspirin to pt; diet; activity level and routine follow up appointment/already scheduled with [facility] for March 2017; no questions voiced by the pt at this time; pt discharged via wheelchair by nursing to the visitor's entrance

## 2015-05-03 ENCOUNTER — Encounter: Payer: Self-pay | Admitting: Family Medicine

## 2015-05-17 ENCOUNTER — Telehealth: Payer: Self-pay | Admitting: Family Medicine

## 2015-05-17 DIAGNOSIS — L570 Actinic keratosis: Secondary | ICD-10-CM

## 2015-05-17 NOTE — Telephone Encounter (Signed)
Pt is requesting a referral to Dr Evorn Gong for total body skin check.He states this was discussed during his last office visit

## 2015-05-18 NOTE — Telephone Encounter (Signed)
Done-aa 

## 2015-05-18 NOTE — Telephone Encounter (Signed)
Ok--AKs

## 2015-05-18 NOTE — Telephone Encounter (Signed)
Is this ok to do?-aa 

## 2015-06-13 ENCOUNTER — Other Ambulatory Visit: Payer: Self-pay

## 2015-06-13 MED ORDER — OMEPRAZOLE 20 MG PO CPDR: 20.0000 mg | DELAYED_RELEASE_CAPSULE | Freq: Every day | ORAL | Status: DC

## 2015-06-13 MED ORDER — PIOGLITAZONE HCL 15 MG PO TABS: 15.0000 mg | ORAL_TABLET | Freq: Every day | ORAL | Status: DC

## 2015-08-05 DIAGNOSIS — X32XXXA Exposure to sunlight, initial encounter: Secondary | ICD-10-CM | POA: Diagnosis not present

## 2015-08-05 DIAGNOSIS — L570 Actinic keratosis: Secondary | ICD-10-CM | POA: Diagnosis not present

## 2015-08-05 DIAGNOSIS — L821 Other seborrheic keratosis: Secondary | ICD-10-CM | POA: Diagnosis not present

## 2015-08-11 ENCOUNTER — Ambulatory Visit (INDEPENDENT_AMBULATORY_CARE_PROVIDER_SITE_OTHER): Payer: PPO | Admitting: Family Medicine

## 2015-08-11 VITALS — BP 122/70 | HR 64 | Temp 97.6°F | Resp 16 | Wt 216.0 lb

## 2015-08-11 DIAGNOSIS — E11610 Type 2 diabetes mellitus with diabetic neuropathic arthropathy: Secondary | ICD-10-CM | POA: Diagnosis not present

## 2015-08-11 DIAGNOSIS — G454 Transient global amnesia: Secondary | ICD-10-CM

## 2015-08-11 LAB — POCT GLYCOSYLATED HEMOGLOBIN (HGB A1C): HEMOGLOBIN A1C: 6.1

## 2015-08-11 NOTE — Progress Notes (Signed)
Patient ID: Roberto Leonard, male   DOB: [birth year], 75 y.o.   MRN: [id number]   Roberto Leonard  MRN: [id number] DOB: [birth year]  Subjective:  HPI   1. Type 2 diabetes mellitus with diabetic neuropathic arthropathy, without long-term current use of insulin [facility]) The patient is a 75 year old male who presents for follow up of his diabetes.  His last visit was on 02/07/15 and no management changes were made at that time.  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  While in the hospital at that time his A1C was checked and it was 5.9.  The patient has not been checking his glucose at home and reports that his diet has not been as good over the last 3 months and anticipates the A1C being elevated.  He has not had any hypoglycemic events or symptoms.    2. Transient global amnesia  Lab Results  Component Value Date   CHOL 140 05/01/2015   HDL 39* 05/01/2015   LDLCALC 77 05/01/2015   TRIG 120 05/01/2015   CHOLHDL 3.6 05/01/2015    Patient Active Problem List   Diagnosis Date Noted  . Memory loss 04/30/2015  . Special screening for malignant neoplasms, colon   . Benign neoplasm of sigmoid colon   . Allergic rhinitis 09/24/2014  . 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 Cheilitis 09/24/2014  . Abnormal prostate specific antigen 09/24/2014  . Erectile dysfunction associated with type 2 diabetes mellitus ([place]) 09/24/2014  . 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 Essential (primary) hypertension 09/24/2014  . Acid reflux 09/24/2014  . Glaucoma 09/24/2014  . Inguinal hernia 09/24/2014  . Hypercholesteremia 09/24/2014  . Detached retina 09/24/2014  . Type 2 diabetes mellitus with diabetic neuropathic arthropathy ([place]) 09/24/2014    Past Medical History  Diagnosis Date  . Allergy   . GERD (gastroesophageal reflux disease)   . Hypertension   . Hyperlipidemia   . ED (erectile dysfunction)   . Dupuytren's  contracture   . Glaucoma   . Detached retina     RIGHT  . Diabetes mellitus without complication ([place])     ORAL MED    Social History   Social History  . Marital Status: Married    Spouse Name: N/A  . Number of Children: N/A  . Years of Education: N/A   Occupational History  . Not on file.   Social History Main Topics  . Smoking status: Former Smoker -- 0.50 packs/day for 2 years    Types: Cigarettes    Quit date: 05/14/1963  . Smokeless tobacco: Not on file  . Alcohol Use: No  . Drug Use: Not on file  . Sexual Activity: Not on file   Other Topics Concern  . Not on file   Social History Narrative    Outpatient Prescriptions Prior to Visit  Medication Sig Dispense Refill  . amLODipine (NORVASC) 10 MG tablet Take 1 tablet (10 mg total) by mouth daily. PM 90 tablet 3  . aspirin EC 325 MG tablet Take 1 tablet (325 mg total) by mouth daily. 60 tablet 0  . brimonidine (ALPHAGAN) 0.2 % ophthalmic solution Place 1 drop into both eyes 2 (two) times daily.  3  . Cetirizine HCl 10 MG CAPS Take 10 mg by mouth daily.     . Cholecalciferol (CVS VIT D 5000 HIGH-POTENCY) 5000 UNITS capsule Take 5,000 Units by mouth daily.    . dorzolamide-timolol (  COSOPT) 22.3-6.8 MG/ML ophthalmic solution Place 1 drop into both eyes 2 (two) times daily.    Marland Kitchen losartan (COZAAR) 100 MG tablet Take 1 tablet (100 mg total) by mouth daily. AM 90 tablet 3  . MULTIPLE VITAMIN PO Take 1 tablet by mouth daily.    . niacin 500 MG tablet Take 1 tablet by mouth daily.    . Omega-3 Fatty Acids (FISH OIL BURP-LESS) 1200 MG CAPS Take 2 capsules by mouth daily.    Marland Kitchen omeprazole (PRILOSEC) 20 MG capsule Take 1 capsule (20 mg total) by mouth daily. 90 capsule 2  . pioglitazone (ACTOS) 15 MG tablet Take 1 tablet (15 mg total) by mouth daily. AM 90 tablet 2  . sildenafil (VIAGRA) 100 MG tablet Take 1 tablet by mouth daily as needed. Reported on 04/30/2015    . simvastatin (ZOCOR) 20 MG tablet TAKE 1 TABLET BY MOUTH DAILY  90 tablet 3   No facility-administered medications prior to visit.    Allergies  Allergen Reactions  . Sulfa Antibiotics Other (See Comments)    SWEATS, bad headache    Review of Systems  Constitutional: Negative for fever and malaise/fatigue.  Respiratory: Negative for cough, shortness of breath and wheezing.   Cardiovascular: Negative for chest pain, palpitations, orthopnea and leg swelling.  Genitourinary: Negative for frequency.  Neurological: Negative for dizziness, weakness and headaches.  Endo/Heme/Allergies: Negative for polydipsia.   Objective:  BP 122/70 mmHg  Pulse 64  Temp(Src) 97.6 F (36.4 C) (Oral)  Resp 16  Wt 216 lb (97.977 kg)  Physical Exam  Constitutional: He is well-developed, well-nourished, and in no distress.  HENT:  Head: Normocephalic and atraumatic.  Right Ear: External ear normal.  Left Ear: External ear normal.  Nose: Nose normal.  Eyes: Conjunctivae are normal. Pupils are equal, round, and reactive to light.  Neck: Normal range of motion.  Cardiovascular: Normal rate, regular rhythm and normal heart sounds.   Pulmonary/Chest: Effort normal and breath sounds normal.  Musculoskeletal: He exhibits edema (trace).  Skin: Skin is warm and dry.  Psychiatric: Mood, memory, affect and judgment normal.    Assessment and Plan :   1. Type 2 diabetes mellitus with diabetic neuropathic arthropathy, without long-term current use of insulin (HCC) A1C 6.1 today.  Slight increase from previously.  Weight gain of 12 pounds.  Patient advised to work harder on habits and we will follow up in 3-4 - POCT glycosylated hemoglobin (Hb A1C)--6.1 today.  2. Transient global amnesia All risk factors for CVA addressed and treated   Miguel Aschoff MD [facility] 08/11/2015 8:16 AM

## 2015-10-04 DIAGNOSIS — H40003 Preglaucoma, unspecified, bilateral: Secondary | ICD-10-CM | POA: Diagnosis not present

## 2015-11-14 ENCOUNTER — Ambulatory Visit (INDEPENDENT_AMBULATORY_CARE_PROVIDER_SITE_OTHER): Payer: PPO | Admitting: Family Medicine

## 2015-11-14 VITALS — BP 104/62 | HR 60 | Temp 97.6°F | Resp 16 | Wt 208.0 lb

## 2015-11-14 DIAGNOSIS — K4090 Unilateral inguinal hernia, without obstruction or gangrene, not specified as recurrent: Secondary | ICD-10-CM

## 2015-11-14 DIAGNOSIS — R972 Elevated prostate specific antigen [PSA]: Secondary | ICD-10-CM | POA: Diagnosis not present

## 2015-11-14 DIAGNOSIS — E7800 Pure hypercholesterolemia, unspecified: Secondary | ICD-10-CM

## 2015-11-14 DIAGNOSIS — E11610 Type 2 diabetes mellitus with diabetic neuropathic arthropathy: Secondary | ICD-10-CM

## 2015-11-14 DIAGNOSIS — I10 Essential (primary) hypertension: Secondary | ICD-10-CM

## 2015-11-14 NOTE — Progress Notes (Signed)
SARIEL BEAS  MRN: [id number] DOB: 10/01/1940  Subjective:  HPI   1. Type 2 diabetes mellitus with diabetic neuropathic arthropathy, without long-term current use of insulin [facility]) The patient is a 75 year old male who presents for follow up on his diabetes.  His last visit was 08/11/15 and his A1C at that time was 6.1.  He does not check his glucose regularly but state that he has been on a low carb diet.  He has lost 8 pounds since his last visit.  The patient is due for a foot exam and his last eye exam was in March of 2017.  2. Abnormal prostate specific antigen The patient has had abnormal PSA and states it is time for him to have it checked again.  Patient did have PSA on 11/05/14 with a result of 5.3.  Lab Results  Component Value Date   PSA 4.4 01/13/2014     3. Unilateral inguinal hernia without obstruction or gangrene, recurrence not specified Patient would also like to have what he believes to be a hernia checked today.  He states that 1 week ago he was lifting a chair when he had sudden onset of pain in the right groin area.  The patient states that several years ago Dr. Pat Patrick did hernia repair on that side and the patient states it feels the same as that did before the repair.    Patient Active Problem List   Diagnosis Date Noted  . Memory loss 04/30/2015  . Special screening for malignant neoplasms, colon   . Benign neoplasm of sigmoid colon   . Allergic rhinitis 09/24/2014  . 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 Cheilitis 09/24/2014  . Abnormal prostate specific antigen 09/24/2014  . Erectile dysfunction associated with type 2 diabetes mellitus ([place]) 09/24/2014  . 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 Essential (primary) hypertension 09/24/2014  . Acid reflux 09/24/2014  . Glaucoma 09/24/2014  . Inguinal hernia 09/24/2014  . Hypercholesteremia 09/24/2014  . Detached retina 09/24/2014  . Type 2 diabetes  mellitus with diabetic neuropathic arthropathy ([place]) 09/24/2014    Past Medical History  Diagnosis Date  . Allergy   . GERD (gastroesophageal reflux disease)   . Hypertension   . Hyperlipidemia   . ED (erectile dysfunction)   . Dupuytren's contracture   . Glaucoma   . Detached retina     RIGHT  . Diabetes mellitus without complication ([place])     ORAL MED    Social History   Social History  . Marital Status: Married    Spouse Name: N/A  . Number of Children: N/A  . Years of Education: N/A   Occupational History  . Not on file.   Social History Main Topics  . Smoking status: Former Smoker -- 0.50 packs/day for 2 years    Types: Cigarettes    Quit date: 05/14/1963  . Smokeless tobacco: Not on file  . Alcohol Use: No  . Drug Use: Not on file  . Sexual Activity: Not on file   Other Topics Concern  . Not on file   Social History Narrative    Outpatient Prescriptions Prior to Visit  Medication Sig Dispense Refill  . amLODipine (NORVASC) 10 MG tablet Take 1 tablet (10 mg total) by mouth daily. PM 90 tablet 3  . aspirin EC 325 MG tablet Take 1 tablet (325 mg total) by mouth daily. 60 tablet 0  . brimonidine (ALPHAGAN) 0.2 %  ophthalmic solution Place 1 drop into both eyes 2 (two) times daily.  3  . Cetirizine HCl 10 MG CAPS Take 10 mg by mouth daily.     . Cholecalciferol (CVS VIT D 5000 HIGH-POTENCY) 5000 UNITS capsule Take 5,000 Units by mouth daily.    . dorzolamide-timolol (COSOPT) 22.3-6.8 MG/ML ophthalmic solution Place 1 drop into both eyes 2 (two) times daily.    Marland Kitchen losartan (COZAAR) 100 MG tablet Take 1 tablet (100 mg total) by mouth daily. AM 90 tablet 3  . MULTIPLE VITAMIN PO Take 1 tablet by mouth daily.    . niacin 500 MG tablet Take 1 tablet by mouth daily.    . Omega-3 Fatty Acids (FISH OIL BURP-LESS) 1200 MG CAPS Take 2 capsules by mouth daily.    Marland Kitchen omeprazole (PRILOSEC) 20 MG capsule Take 1 capsule (20 mg total) by mouth daily. 90 capsule 2  .  pioglitazone (ACTOS) 15 MG tablet Take 1 tablet (15 mg total) by mouth daily. AM 90 tablet 2  . sildenafil (VIAGRA) 100 MG tablet Take 1 tablet by mouth daily as needed. Reported on 04/30/2015    . simvastatin (ZOCOR) 20 MG tablet TAKE 1 TABLET BY MOUTH DAILY 90 tablet 3   No facility-administered medications prior to visit.    Allergies  Allergen Reactions  . Sulfa Antibiotics Other (See Comments)    SWEATS, bad headache    Review of Systems  Constitutional: Negative for fever and malaise/fatigue.  Respiratory: Negative for cough, shortness of breath and wheezing.   Cardiovascular: Negative for chest pain, palpitations, orthopnea, claudication, leg swelling and PND.  Gastrointestinal: Positive for constipation (Symptoms since starting his low carb diet.  Due to decreased fiber, patienet now has just started using a stool softener.). Negative for heartburn, nausea, vomiting, abdominal pain, diarrhea, blood in stool and melena.  Neurological: Negative for dizziness, weakness and headaches.  Psychiatric/Behavioral: Negative.    Objective:  BP 104/62 mmHg  Pulse 60  Temp(Src) 97.6 F (36.4 C) (Oral)  Resp 16  Wt 208 lb (94.348 kg)  Physical Exam  Constitutional: He is oriented to person, place, and time and well-developed, well-nourished, and in no distress.  HENT:  Head: Normocephalic and atraumatic.  Right Ear: External ear normal.  Left Ear: External ear normal.  Eyes: Conjunctivae and EOM are normal. Pupils are equal, round, and reactive to light.  Neck: Normal range of motion. Neck supple.  Cardiovascular: Normal rate, regular rhythm, normal heart sounds and intact distal pulses.   Pulmonary/Chest: Effort normal and breath sounds normal.  Abdominal: Soft. Bowel sounds are normal. No hernia. Hernia confirmed negative in the umbilical area, confirmed negative in the right inguinal area and confirmed negative in the left inguinal area.  Genitourinary: Testes/scrotum normal and  penis normal.  Musculoskeletal:  Positive figure 4  Neurological: He is alert and oriented to person, place, and time. Gait normal.  Skin: Skin is warm and dry.  Psychiatric: Mood, memory, affect and judgment normal.    Assessment and Plan :   1. Type 2 diabetes mellitus with diabetic neuropathic arthropathy, without long-term current use of insulin ([place]) Patient, to his credit has lost 8 pounds with a low-carb diet. He has been avoiding fresh fruits because of carbohydrates and we discussed adding fresh fruits back to the diet. - Hemoglobin A1c  2. Abnormal prostate specific antigen Refer to urology if above 6. - PSA  3. Essential (primary) hypertension  - Comprehensive metabolic panel  4. Unilateral inguinal hernia without  obstruction or gangrene, recurrence not specified No evidence of hernia on exam today  5. Hypercholesteremia  - Lipid Panel With LDL/HDL Ratio 6 right groin strain versus right hip OA Follow clinically. We'll do x-ray if the hip pain becomes weightbearing.  Miguel Aschoff MD [facility] 11/14/2015 8:16 AM

## 2015-11-15 LAB — COMPREHENSIVE METABOLIC PANEL
ALK PHOS: 67 IU/L (ref 39–117)
ALT: 28 IU/L (ref 0–44)
AST: 21 IU/L (ref 0–40)
Albumin/Globulin Ratio: 1.8 (ref 1.2–2.2)
Albumin: 4.1 g/dL (ref 3.5–4.8)
BILIRUBIN TOTAL: 0.5 mg/dL (ref 0.0–1.2)
BUN/Creatinine Ratio: 26 — ABNORMAL HIGH (ref 10–24)
BUN: 19 mg/dL (ref 8–27)
CHLORIDE: 104 mmol/L (ref 96–106)
CO2: 23 mmol/L (ref 18–29)
Calcium: 9 mg/dL (ref 8.6–10.2)
Creatinine, Ser: 0.73 mg/dL — ABNORMAL LOW (ref 0.76–1.27)
GFR calc Af Amer: 104 mL/min/{1.73_m2} (ref >59)
GFR calc non Af Amer: 90 mL/min/{1.73_m2} (ref >59)
GLUCOSE: 112 mg/dL — AB (ref 65–99)
Globulin, Total: 2.3 g/dL (ref 1.5–4.5)
Potassium: 4.5 mmol/L (ref 3.5–5.2)
Sodium: 144 mmol/L (ref 134–144)
TOTAL PROTEIN: 6.4 g/dL (ref 6.0–8.5)

## 2015-11-15 LAB — LIPID PANEL WITH LDL/HDL RATIO
CHOLESTEROL TOTAL: 117 mg/dL (ref 100–199)
HDL: 39 mg/dL — AB (ref >39)
LDL Calculated: 63 mg/dL (ref 0–99)
LDl/HDL Ratio: 1.6 ratio units (ref 0.0–3.6)
TRIGLYCERIDES: 75 mg/dL (ref 0–149)
VLDL CHOLESTEROL CAL: 15 mg/dL (ref 5–40)

## 2015-11-15 LAB — HEMOGLOBIN A1C
Est. average glucose Bld gHb Est-mCnc: 131 mg/dL
HEMOGLOBIN A1C: 6.2 % — AB (ref 4.8–5.6)

## 2015-11-15 LAB — PSA: PROSTATE SPECIFIC AG, SERUM: 5 ng/mL — AB (ref 0.0–4.0)

## 2015-11-16 ENCOUNTER — Telehealth: Payer: Self-pay

## 2015-11-16 NOTE — Telephone Encounter (Signed)
Tried calling patient and number was busy. Will try again later.  

## 2015-11-16 NOTE — Telephone Encounter (Signed)
-----   Message from [name]., MD sent at 11/16/2015  8:21 AM EDT ----- A1c and PSA both stable.

## 2015-11-24 ENCOUNTER — Other Ambulatory Visit: Payer: Self-pay | Admitting: Emergency Medicine

## 2015-11-24 DIAGNOSIS — E11610 Type 2 diabetes mellitus with diabetic neuropathic arthropathy: Secondary | ICD-10-CM

## 2015-11-24 MED ORDER — PIOGLITAZONE HCL 30 MG PO TABS: 30.0000 mg | ORAL_TABLET | Freq: Every day | ORAL | Status: DC

## 2016-03-04 ENCOUNTER — Other Ambulatory Visit: Payer: Self-pay | Admitting: Family Medicine

## 2016-03-14 ENCOUNTER — Other Ambulatory Visit: Payer: Self-pay | Admitting: Family Medicine

## 2016-03-31 ENCOUNTER — Other Ambulatory Visit: Payer: Self-pay | Admitting: Family Medicine

## 2016-04-02 DIAGNOSIS — H40003 Preglaucoma, unspecified, bilateral: Secondary | ICD-10-CM | POA: Diagnosis not present

## 2016-04-04 DIAGNOSIS — H40003 Preglaucoma, unspecified, bilateral: Secondary | ICD-10-CM | POA: Diagnosis not present

## 2016-05-16 ENCOUNTER — Encounter: Payer: Self-pay | Admitting: Family Medicine

## 2016-05-16 ENCOUNTER — Ambulatory Visit (INDEPENDENT_AMBULATORY_CARE_PROVIDER_SITE_OTHER): Payer: PPO | Admitting: Family Medicine

## 2016-05-16 VITALS — BP 124/70 | HR 76 | Temp 97.5°F | Resp 16 | Ht 74.0 in | Wt 219.0 lb

## 2016-05-16 DIAGNOSIS — Z9189 Other specified personal risk factors, not elsewhere classified: Secondary | ICD-10-CM | POA: Diagnosis not present

## 2016-05-16 DIAGNOSIS — E11610 Type 2 diabetes mellitus with diabetic neuropathic arthropathy: Secondary | ICD-10-CM

## 2016-05-16 DIAGNOSIS — Z0000 Encounter for general adult medical examination without abnormal findings: Secondary | ICD-10-CM

## 2016-05-16 DIAGNOSIS — L84 Corns and callosities: Secondary | ICD-10-CM | POA: Diagnosis not present

## 2016-05-16 DIAGNOSIS — Z0001 Encounter for general adult medical examination with abnormal findings: Secondary | ICD-10-CM | POA: Diagnosis not present

## 2016-05-16 DIAGNOSIS — Z23 Encounter for immunization: Secondary | ICD-10-CM | POA: Diagnosis not present

## 2016-05-16 LAB — POCT GLYCOSYLATED HEMOGLOBIN (HGB A1C)
ESTIMATED AVERAGE GLUCOSE: 123
Hemoglobin A1C: 5.9

## 2016-05-16 NOTE — Progress Notes (Signed)
Patient: Roberto Leonard, Male    DOB: 26-Mar-1941, 76 y.o.   MRN: [id number] Visit Date: 05/16/2016  Today's Provider: [name], MD   Chief Complaint  Patient presents with  . Medicare Wellness   Subjective:    Annual wellness visit Roberto Leonard is a 76 y.o. male. He feels fairly well. Pt is concerned about a "bump" on his right foot (pt has DM). He reports exercising daily. Walks about 2 miles daily. He reports he is sleeping fairly well.  Last colonoscopy- 01/03/2015- tubular adenoma. Dr. Allen Norris. Repeat 5 years.   He feels well and is doing well with diet and exercise control and his prediabetes. He exercises regularly. -----------------------------------------------------------   Review of Systems  Constitutional: Negative.   HENT: Negative.   Eyes: Negative.   Respiratory: Negative.   Cardiovascular: Negative.   Gastrointestinal: Negative.   Endocrine: Negative.   Genitourinary: Negative.   Musculoskeletal: Negative.   Skin: Negative.   Allergic/Immunologic: Negative.   Neurological: Negative.   Hematological: Negative.   Psychiatric/Behavioral: Negative.     Social History   Social History  . Marital status: Married    Spouse name: Pricilla  . Number of children: 3  . Years of education: college   Occupational History  . Retired   . 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 Smoking status: Former Smoker    Packs/day: 0.50    Years: 2.00    Types: Cigarettes    Quit date: 05/14/1963  . Smokeless tobacco: Never Used  . Alcohol use No  . Drug use: No  . Sexual activity: Not on file   Other Topics Concern  . Not on file   Social History Narrative  . No narrative on file    Past Medical History:  Diagnosis Date  . Allergy   . Detached retina    RIGHT  . Diabetes mellitus without complication (HCC)    ORAL MED  . Dupuytren's contracture   . ED  (erectile dysfunction)   . GERD (gastroesophageal reflux disease)   . Glaucoma   . Hyperlipidemia   . Hypertension      Patient Active Problem List   Diagnosis Date Noted  . Memory loss 04/30/2015  . Special screening for malignant neoplasms, colon   . Benign neoplasm of sigmoid colon   . Allergic rhinitis 09/24/2014  . 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 Cheilitis 09/24/2014  . Abnormal prostate specific antigen 09/24/2014  . Erectile dysfunction associated with type 2 diabetes mellitus ([place]) 09/24/2014  . 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 Essential (primary) hypertension 09/24/2014  . Acid reflux 09/24/2014  . Glaucoma 09/24/2014  . Inguinal hernia 09/24/2014  . Hypercholesteremia 09/24/2014  . Detached retina 09/24/2014  . Type 2 diabetes mellitus with diabetic neuropathic arthropathy ([place]) 09/24/2014    Past Surgical History:  Procedure Laterality Date  . CATARACT EXTRACTION     right  . COLONOSCOPY WITH PROPOFOL N/A 01/03/2015   Procedure: COLONOSCOPY WITH PROPOFOL;  Surgeon: Lucilla Lame, MD;  Location: [place];  Service: Endoscopy;  Laterality: N/A;  DIABETIC-ORAL MEDS  . HERNIA REPAIR    . POLYPECTOMY N/A 01/03/2015   Procedure: POLYPECTOMY;  Surgeon: Lucilla Lame, MD;  Location: [place];  Service: Endoscopy;  Laterality: N/A;  Sigmoid polyp  . RETINAL  DETACHMENT SURGERY     right eye, wrong size lense, secondary surgery to replace it    His family history includes Diabetes in his brother, daughter, father, and mother; Heart disease in his father and mother; Hypertension in his father.      Current Outpatient Prescriptions:  .  amLODipine (NORVASC) 10 MG tablet, TAKE 1 TABLET BY MOUTH DAILY EACH EVENING, Disp: 90 tablet, Rfl: 3 .  aspirin EC 325 MG tablet, Take 1 tablet (325 mg total) by mouth daily., Disp: 60 tablet, Rfl: 0 .  brimonidine (ALPHAGAN) 0.2 % ophthalmic solution, Place 1  drop into both eyes 2 (two) times daily., Disp: , Rfl: 3 .  Cetirizine HCl 10 MG CAPS, Take 10 mg by mouth daily. , Disp: , Rfl:  .  Cholecalciferol (CVS VIT D 5000 HIGH-POTENCY) 5000 UNITS capsule, Take 5,000 Units by mouth daily., Disp: , Rfl:  .  dorzolamide-timolol (COSOPT) 22.3-6.8 MG/ML ophthalmic solution, Place 1 drop into both eyes 2 (two) times daily., Disp: , Rfl:  .  losartan (COZAAR) 100 MG tablet, TAKE 1 TABLET (100 MG TOTAL) BY MOUTH DAILY IN THE MORNING, Disp: 90 tablet, Rfl: 3 .  MULTIPLE VITAMIN PO, Take 1 tablet by mouth daily., Disp: , Rfl:  .  niacin 500 MG tablet, Take 1 tablet by mouth daily., Disp: , Rfl:  .  Omega-3 Fatty Acids (FISH OIL BURP-LESS) 1200 MG CAPS, Take 2 capsules by mouth daily., Disp: , Rfl:  .  omeprazole (PRILOSEC) 20 MG capsule, TAKE 1 CAPSULE (20 MG TOTAL) BY MOUTH DAILY., Disp: 90 capsule, Rfl: 2 .  pioglitazone (ACTOS) 30 MG tablet, Take 1 tablet (30 mg total) by mouth daily. AM, Disp: 90 tablet, Rfl: 3 .  simvastatin (ZOCOR) 20 MG tablet, TAKE 1 TABLET BY MOUTH DAILY, Disp: 90 tablet, Rfl: 3  Patient Care Team: [name]., MD as PCP - General (Family Medicine)     Objective:   Vitals: BP 124/70 (BP Location: Right Arm, Patient Position: Sitting, Cuff Size: Large)   Pulse 76   Temp 97.5 F (36.4 C) (Oral)   Resp 16   Ht 6\' 2"  (1.88 m)   Wt 219 lb (99.3 kg)   BMI 28.12 kg/m   Physical Exam  Constitutional: He is oriented to person, place, and time. He appears well-developed and well-nourished.  HENT:  Head: Normocephalic and atraumatic.  Right Ear: External ear normal.  Left Ear: External ear normal.  Nose: Nose normal.  Mouth/Throat: Oropharynx is clear and moist. No oropharyngeal exudate.  Eyes: Conjunctivae and EOM are normal. Pupils are equal, round, and reactive to light. Right eye exhibits no discharge. Left eye exhibits no discharge.  Neck: Normal range of motion. Neck supple. No thyromegaly present.    Cardiovascular: Normal rate, regular rhythm and normal heart sounds.   Pulmonary/Chest: Effort normal and breath sounds normal. No respiratory distress.  Abdominal: Soft. Bowel sounds are normal. There is no tenderness.  Genitourinary: Rectum normal, prostate normal and penis normal.  Musculoskeletal: Normal range of motion. He exhibits no edema.  Lymphadenopathy:    He has no cervical adenopathy.  Neurological: He is alert and oriented to person, place, and time. He has normal reflexes.  Skin: Skin is warm and dry.  Psychiatric: He has a normal mood and affect. His behavior is normal.    Activities of Daily Living In your present state of health, do you have any difficulty performing the following activities: 05/16/2016  Hearing? N  Vision? N  Difficulty concentrating or making decisions? N  Walking or climbing stairs? N  Dressing or bathing? N  Doing errands, shopping? N  Some recent data might be hidden    Fall Risk Assessment Fall Risk  05/16/2016  Falls in the past year? No     Depression Screen PHQ 2/9 Scores 05/16/2016  PHQ - 2 Score 0    Cognitive Testing - 6-CIT  Correct? Score   What year is it? yes 0 0 or 4  What month is it? yes 0 0 or 3  Memorize:    Pia Mau,  41,  [address]  [place],      What time is it? (within 1 hour) yes 0 0 or 3  Count backwards from 20 yes 0 0, 2, or 4  Name the months of the year yes 0 0, 2, or 4  Repeat name & address above yes 0 0, 2, 4, 6, 8, or 10       TOTAL SCORE  0/28   Interpretation:  Normal  Normal (0-7) Abnormal (8-28)       Assessment & Plan:     Annual Wellness Visit  Reviewed patient's Family Medical History Reviewed and updated list of patient's medical providers Assessment of cognitive impairment was done Assessed patient's functional ability Established a written schedule for health screening [place] Completed and Reviewed  Exercise Activities and Dietary  recommendations Goals    None      Immunization History  Administered Date(s) Administered  . Hepatitis B, adult 05/16/2016  . Influenza Split 01/27/2016  . Influenza, High Dose Seasonal PF 02/10/2015  . Pneumococcal Conjugate-13 01/12/2014  . Pneumococcal Polysaccharide-23 06/23/1998, 03/08/2005  . Td 05/21/2006  . Zoster 09/12/2005    Health Maintenance  Topic Date Due  . FOOT EXAM  10/09/1949  . OPHTHALMOLOGY EXAM  10/09/1949  . INFLUENZA VACCINE  12/13/2015  . TETANUS/TDAP  05/15/2016  . HEMOGLOBIN A1C  05/16/2016  . COLONOSCOPY  01/03/2020  . ZOSTAVAX  Completed  . PNA vac Low Risk Adult  Completed     Discussed health benefits of physical activity, and encouraged him to engage in regular exercise appropriate for his age and condition.    ------------------------------------------------------------------------------------------------------------ 1. Medicare annual wellness visit, subsequent  2. Type 2 diabetes mellitus with diabetic neuropathic arthropathy, without long-term current use of insulin (HCC) Stable. Continue current healthy lifestyle modifications. Monofilament normal today. - POCT glycosylated hemoglobin (Hb A1C)--5.9 today. - Hepatitis B vaccine adult IM Results for orders placed or performed in visit on 05/16/16  POCT glycosylated hemoglobin (Hb A1C)  Result Value Ref Range   Hemoglobin A1C 5.9    Est. average glucose Bld gHb Est-mCnc 123    Diabetic Foot Exam - Simple   Simple Foot Form Diabetic Foot exam was performed with the following findings:  Yes 05/16/2016 10:01 AM  Visual Inspection No deformities, no ulcerations, no other skin breakdown bilaterally:  Yes Sensation Testing Intact to touch and monofilament testing bilaterally:  Yes Pulse Check Posterior Tibialis and Dorsalis pulse intact bilaterally:  Yes Comments Fine Monofilament today     3. Corn of foot Refer to Dr. Elvina Mattes as below. - Ambulatory referral to Podiatry  4. At  risk for hepatitis Administered today. FU 2 months for 2nd vaccine. - Hepatitis B vaccine adult IM 5.HTN 6.HLD Patient seen and examined by Miguel Aschoff, MD, and note scribed by Renaldo Fiddler, CMA. I have done the exam and reviewed the chart and it  is accurate to the best of my knowledge. Development worker, community has been used and  any errors in dictation or transcription are unintentional. Miguel Aschoff M.D. [place], MD  [facility]

## 2016-06-04 DIAGNOSIS — E1142 Type 2 diabetes mellitus with diabetic polyneuropathy: Secondary | ICD-10-CM | POA: Diagnosis not present

## 2016-06-04 DIAGNOSIS — D2371 Other benign neoplasm of skin of right lower limb, including hip: Secondary | ICD-10-CM | POA: Diagnosis not present

## 2016-06-04 DIAGNOSIS — M79671 Pain in right foot: Secondary | ICD-10-CM | POA: Diagnosis not present

## 2016-07-02 DIAGNOSIS — E1142 Type 2 diabetes mellitus with diabetic polyneuropathy: Secondary | ICD-10-CM | POA: Diagnosis not present

## 2016-07-02 DIAGNOSIS — M79671 Pain in right foot: Secondary | ICD-10-CM | POA: Diagnosis not present

## 2016-07-02 DIAGNOSIS — D2371 Other benign neoplasm of skin of right lower limb, including hip: Secondary | ICD-10-CM | POA: Diagnosis not present

## 2016-07-03 ENCOUNTER — Ambulatory Visit (INDEPENDENT_AMBULATORY_CARE_PROVIDER_SITE_OTHER): Payer: PPO | Admitting: Family Medicine

## 2016-07-03 ENCOUNTER — Ambulatory Visit
Admission: RE | Admit: 2016-07-03 | Discharge: 2016-07-03 | Disposition: A | Discharge status: Home / Self Care | Payer: PPO | Source: Ambulatory Visit | Attending: Family Medicine | Admitting: Family Medicine

## 2016-07-03 ENCOUNTER — Encounter: Payer: Self-pay | Admitting: Family Medicine

## 2016-07-03 VITALS — BP 122/70 | HR 84 | Temp 98.8°F | Resp 16 | Wt 220.0 lb

## 2016-07-03 DIAGNOSIS — R05 Cough: Secondary | ICD-10-CM | POA: Diagnosis not present

## 2016-07-03 DIAGNOSIS — R059 Cough, unspecified: Secondary | ICD-10-CM

## 2016-07-03 DIAGNOSIS — J40 Bronchitis, not specified as acute or chronic: Secondary | ICD-10-CM | POA: Diagnosis not present

## 2016-07-03 LAB — POCT INFLUENZA A/B
Influenza A, POC: NEGATIVE
Influenza B, POC: NEGATIVE

## 2016-07-03 MED ORDER — DOXYCYCLINE HYCLATE 100 MG PO TABS: 100.0000 mg | ORAL_TABLET | Freq: Two times a day (BID) | ORAL | 0 refills | Status: DC

## 2016-07-03 NOTE — Progress Notes (Signed)
Patient: Roberto Leonard Male    DOB: March 13, 1941   77 y.o.   MRN: [id number] Visit Date: 07/03/2016  Today's Provider: [name], MD   Chief Complaint  Patient presents with  . URI   Subjective:    URI   This is a new problem. The current episode started in the past 7 days (x 2 days). The problem has been gradually worsening. Maximum temperature: temperature was 100.1 last night. The fever has been present for less than 1 day. Associated symptoms include coughing ("deep"; dry and hacking), rhinorrhea, sneezing, a sore throat and swollen glands. Pertinent negatives include no abdominal pain, chest pain, congestion, diarrhea, ear pain, headaches, nausea, neck pain, plugged ear sensation, sinus pain, vomiting or wheezing. He has tried antihistamine for the symptoms. The treatment provided no relief.  No body aches,myalgias.No headache or neck pain.      Allergies  Allergen Reactions  . Sulfa Antibiotics Other (See Comments)    SWEATS, bad headache     Current Outpatient Prescriptions:  .  amLODipine (NORVASC) 10 MG tablet, TAKE 1 TABLET BY MOUTH DAILY EACH EVENING, Disp: 90 tablet, Rfl: 3 .  aspirin EC 325 MG tablet, Take 1 tablet (325 mg total) by mouth daily., Disp: 60 tablet, Rfl: 0 .  brimonidine (ALPHAGAN) 0.2 % ophthalmic solution, Place 1 drop into both eyes 2 (two) times daily., Disp: , Rfl: 3 .  Cetirizine HCl 10 MG CAPS, Take 10 mg by mouth daily. , Disp: , Rfl:  .  Cholecalciferol ([employer] VIT D 5000 HIGH-POTENCY) 5000 UNITS capsule, Take 5,000 Units by mouth daily., Disp: , Rfl:  .  dorzolamide-timolol (COSOPT) 22.3-6.8 MG/ML ophthalmic solution, Place 1 drop into both eyes 2 (two) times daily., Disp: , Rfl:  .  losartan (COZAAR) 100 MG tablet, TAKE 1 TABLET (100 MG TOTAL) BY MOUTH DAILY IN THE MORNING, Disp: 90 tablet, Rfl: 3 .  MULTIPLE VITAMIN PO, Take 1 tablet by mouth daily., Disp: , Rfl:  .  niacin 500 MG tablet, Take 1 tablet by mouth daily., Disp: ,  Rfl:  .  Omega-3 Fatty Acids (FISH OIL BURP-LESS) 1200 MG CAPS, Take 2 capsules by mouth daily., Disp: , Rfl:  .  omeprazole (PRILOSEC) 20 MG capsule, TAKE 1 CAPSULE (20 MG TOTAL) BY MOUTH DAILY., Disp: 90 capsule, Rfl: 2 .  pioglitazone (ACTOS) 30 MG tablet, Take 1 tablet (30 mg total) by mouth daily. AM, Disp: 90 tablet, Rfl: 3 .  simvastatin (ZOCOR) 20 MG tablet, TAKE 1 TABLET BY MOUTH DAILY, Disp: 90 tablet, Rfl: 3  Review of Systems  Constitutional: Positive for chills and fever.  HENT: Positive for rhinorrhea, sneezing and sore throat. Negative for congestion, ear pain and sinus pain.   Respiratory: Positive for cough ("deep"; dry and hacking). Negative for wheezing.   Cardiovascular: Negative for chest pain.  Gastrointestinal: Negative for abdominal pain, diarrhea, nausea and vomiting.  Musculoskeletal: Negative for neck pain.  Neurological: Negative for headaches.    Social History  Substance Use Topics  . Smoking status: Former Smoker    Packs/day: 0.50    Years: 2.00    Types: Cigarettes    Quit date: 05/14/1963  . Smokeless tobacco: Never Used  . Alcohol use No   Objective:   BP 122/70 (BP Location: Right Arm, Patient Position: Sitting, Cuff Size: Large)   Pulse 84   Temp 98.8 F (37.1 C) (Oral)   Resp 16   Wt 220 lb (99.8  kg)   SpO2 94%   BMI 28.25 kg/m   Physical Exam  Constitutional: He is oriented to person, place, and time. He appears well-developed and well-nourished.  HENT:  Head: Normocephalic and atraumatic.  Right Ear: External ear normal.  Left Ear: External ear normal.  Nose: Nose normal.  Eyes: Conjunctivae are normal.  Neck: Neck supple.  Cardiovascular: Normal rate, regular rhythm and normal heart sounds.   Pulmonary/Chest: Effort normal and breath sounds normal.  Abdominal: Soft.  Neurological: He is alert and oriented to person, place, and time.  Skin: Skin is warm and dry.  Psychiatric: He has a normal mood and affect. His behavior is  normal. Judgment and thought content normal.        Assessment & Plan:     1. Cough  - POCT Influenza A/B - DG Chest 2 View  2. Bronchitis  - doxycycline (VIBRA-TABS) 100 MG tablet; Take 1 tablet (100 mg total) by mouth 2 (two) times daily.  Dispense: 14 tablet; Refill: 0 3.TIIDM    Patient seen and examined by Miguel Aschoff, MD, and note scribed by Renaldo Fiddler, CMA. I have done the exam and reviewed the above chart and it is accurate to the best of my knowledge. Development worker, community has been used in this note in any air is in the dictation or transcription are unintentional.  [name], MD  [place]

## 2016-07-03 NOTE — Patient Instructions (Signed)
Start Robitussin for cough and push fluids. Get plenty of rest. Please call office if you develop any body aches.

## 2016-07-04 NOTE — Progress Notes (Signed)
Advised  ED 

## 2016-07-30 DIAGNOSIS — D2272 Melanocytic nevi of left lower limb, including hip: Secondary | ICD-10-CM | POA: Diagnosis not present

## 2016-07-30 DIAGNOSIS — D2261 Melanocytic nevi of right upper limb, including shoulder: Secondary | ICD-10-CM | POA: Diagnosis not present

## 2016-07-30 DIAGNOSIS — L821 Other seborrheic keratosis: Secondary | ICD-10-CM | POA: Diagnosis not present

## 2016-07-30 DIAGNOSIS — D225 Melanocytic nevi of trunk: Secondary | ICD-10-CM | POA: Diagnosis not present

## 2016-08-27 ENCOUNTER — Ambulatory Visit: Payer: PPO | Admitting: Family Medicine

## 2016-08-29 ENCOUNTER — Ambulatory Visit (INDEPENDENT_AMBULATORY_CARE_PROVIDER_SITE_OTHER): Payer: PPO | Admitting: Family Medicine

## 2016-08-29 DIAGNOSIS — Z23 Encounter for immunization: Secondary | ICD-10-CM | POA: Diagnosis not present

## 2016-08-29 MED ORDER — HEPATITIS B VAC RECOMBINANT 10 MCG/ML IJ SUSP: 1.0000 mL | Freq: Once | INTRAMUSCULAR | Status: DC

## 2016-08-29 NOTE — Progress Notes (Signed)
Patient is here to get Hepatitis B immunization number 2. Patient also has issues with constipation and per Dr Rosanna Randy patient can try Glycolax first and after having bowel movement take metamucil daily and follow as needed.-Anastasiya A, RMA

## 2016-09-28 ENCOUNTER — Telehealth: Payer: Self-pay | Admitting: Family Medicine

## 2016-09-28 NOTE — Telephone Encounter (Signed)
Patient contacted to schedule AWV

## 2016-10-02 NOTE — Telephone Encounter (Signed)
Patient came in saying he got a call to schedule his wellness.  He states that he has an appointment in July and will decide then if he wants to schedule his wellness for January.

## 2016-10-03 DIAGNOSIS — H40003 Preglaucoma, unspecified, bilateral: Secondary | ICD-10-CM | POA: Diagnosis not present

## 2016-11-11 ENCOUNTER — Observation Stay
Admission: EM | Admit: 2016-11-11 | Discharge: 2016-11-12 | Disposition: A | Discharge status: Home / Self Care | Payer: PPO | Attending: Internal Medicine | Admitting: Internal Medicine

## 2016-11-11 ENCOUNTER — Emergency Department: Payer: PPO

## 2016-11-11 ENCOUNTER — Encounter: Payer: Self-pay | Admitting: Emergency Medicine

## 2016-11-11 DIAGNOSIS — E11610 Type 2 diabetes mellitus with diabetic neuropathic arthropathy: Secondary | ICD-10-CM | POA: Diagnosis not present

## 2016-11-11 DIAGNOSIS — R55 Syncope and collapse: Secondary | ICD-10-CM | POA: Diagnosis not present

## 2016-11-11 DIAGNOSIS — Z882 Allergy status to sulfonamides status: Secondary | ICD-10-CM | POA: Diagnosis not present

## 2016-11-11 DIAGNOSIS — Z8249 Family history of ischemic heart disease and other diseases of the circulatory system: Secondary | ICD-10-CM | POA: Insufficient documentation

## 2016-11-11 DIAGNOSIS — N529 Male erectile dysfunction, unspecified: Secondary | ICD-10-CM | POA: Diagnosis not present

## 2016-11-11 DIAGNOSIS — Z87891 Personal history of nicotine dependence: Secondary | ICD-10-CM | POA: Insufficient documentation

## 2016-11-11 DIAGNOSIS — K219 Gastro-esophageal reflux disease without esophagitis: Secondary | ICD-10-CM | POA: Diagnosis not present

## 2016-11-11 DIAGNOSIS — I700 Atherosclerosis of aorta: Secondary | ICD-10-CM | POA: Diagnosis not present

## 2016-11-11 DIAGNOSIS — Z79899 Other long term (current) drug therapy: Secondary | ICD-10-CM | POA: Insufficient documentation

## 2016-11-11 DIAGNOSIS — E119 Type 2 diabetes mellitus without complications: Secondary | ICD-10-CM | POA: Diagnosis not present

## 2016-11-11 DIAGNOSIS — H409 Unspecified glaucoma: Secondary | ICD-10-CM | POA: Insufficient documentation

## 2016-11-11 DIAGNOSIS — I10 Essential (primary) hypertension: Secondary | ICD-10-CM | POA: Insufficient documentation

## 2016-11-11 DIAGNOSIS — E7800 Pure hypercholesterolemia, unspecified: Secondary | ICD-10-CM | POA: Insufficient documentation

## 2016-11-11 DIAGNOSIS — Z7984 Long term (current) use of oral hypoglycemic drugs: Secondary | ICD-10-CM | POA: Diagnosis not present

## 2016-11-11 DIAGNOSIS — I4510 Unspecified right bundle-branch block: Secondary | ICD-10-CM | POA: Diagnosis not present

## 2016-11-11 DIAGNOSIS — G9001 Carotid sinus syncope: Secondary | ICD-10-CM

## 2016-11-11 DIAGNOSIS — Z7982 Long term (current) use of aspirin: Secondary | ICD-10-CM | POA: Diagnosis not present

## 2016-11-11 LAB — URINALYSIS, COMPLETE (UACMP) WITH MICROSCOPIC
BACTERIA UA: NONE SEEN
BILIRUBIN URINE: NEGATIVE
Glucose, UA: >=500 mg/dL — AB
HGB URINE DIPSTICK: NEGATIVE
KETONES UR: 5 mg/dL — AB
Leukocytes, UA: NEGATIVE
NITRITE: NEGATIVE
PROTEIN: NEGATIVE mg/dL
SPECIFIC GRAVITY, URINE: 1.003 — AB (ref 1.005–1.030)
Squamous Epithelial / LPF: NONE SEEN
pH: 6 (ref 5.0–8.0)

## 2016-11-11 LAB — COMPREHENSIVE METABOLIC PANEL
ALBUMIN: 3.8 g/dL (ref 3.5–5.0)
ALK PHOS: 58 U/L (ref 38–126)
ALT: 21 U/L (ref 17–63)
ANION GAP: 3 — AB (ref 5–15)
AST: 22 U/L (ref 15–41)
BUN: 24 mg/dL — ABNORMAL HIGH (ref 6–20)
CHLORIDE: 108 mmol/L (ref 101–111)
CO2: 26 mmol/L (ref 22–32)
Calcium: 8.5 mg/dL — ABNORMAL LOW (ref 8.9–10.3)
Creatinine, Ser: 0.77 mg/dL (ref 0.61–1.24)
GFR calc non Af Amer: >60 mL/min (ref >60)
Glucose, Bld: 134 mg/dL — ABNORMAL HIGH (ref 65–99)
POTASSIUM: 3.6 mmol/L (ref 3.5–5.1)
SODIUM: 137 mmol/L (ref 135–145)
Total Bilirubin: 0.9 mg/dL (ref 0.3–1.2)
Total Protein: 6.5 g/dL (ref 6.5–8.1)

## 2016-11-11 LAB — CBC WITH DIFFERENTIAL/PLATELET
Basophils Absolute: 0.1 10*3/uL (ref 0–0.1)
Basophils Relative: 1 %
Eosinophils Absolute: 0.2 10*3/uL (ref 0–0.7)
Eosinophils Relative: 2 %
HEMATOCRIT: 41.7 % (ref 40.0–52.0)
HEMOGLOBIN: 14 g/dL (ref 13.0–18.0)
LYMPHS ABS: 1.1 10*3/uL (ref 1.0–3.6)
LYMPHS PCT: 12 %
MCH: 29.2 pg (ref 26.0–34.0)
MCHC: 33.5 g/dL (ref 32.0–36.0)
MCV: 87.2 fL (ref 80.0–100.0)
MONOS PCT: 9 %
Monocytes Absolute: 0.8 10*3/uL (ref 0.2–1.0)
NEUTROS ABS: 6.8 10*3/uL — AB (ref 1.4–6.5)
NEUTROS PCT: 76 %
Platelets: 136 10*3/uL — ABNORMAL LOW (ref 150–440)
RBC: 4.78 MIL/uL (ref 4.40–5.90)
RDW: 12.9 % (ref 11.5–14.5)
WBC: 9 10*3/uL (ref 3.8–10.6)

## 2016-11-11 LAB — TROPONIN I
Troponin I: <0.03 ng/mL (ref <0.03)
Troponin I: <0.03 ng/mL (ref <0.03)

## 2016-11-11 LAB — TSH: TSH: 0.742 u[IU]/mL (ref 0.350–4.500)

## 2016-11-11 MED ORDER — PANTOPRAZOLE SODIUM 40 MG PO TBEC
40.0000 mg | DELAYED_RELEASE_TABLET | Freq: Every day | ORAL | Status: DC
  Administered 2016-11-12: 40 mg via ORAL
  Filled 2016-11-11: qty 1

## 2016-11-11 MED ORDER — OMEGA-3-ACID ETHYL ESTERS 1 G PO CAPS
2.0000 | ORAL_CAPSULE | Freq: Every day | ORAL | Status: DC
  Filled 2016-11-11: qty 2

## 2016-11-11 MED ORDER — ONDANSETRON HCL 4 MG/2ML IJ SOLN: 4.0000 mg | Freq: Four times a day (QID) | INTRAMUSCULAR | Status: DC | PRN

## 2016-11-11 MED ORDER — VITAMIN D 1000 UNITS PO TABS
5000.0000 [IU] | ORAL_TABLET | Freq: Every day | ORAL | Status: DC
  Administered 2016-11-12: 5000 [IU] via ORAL
  Filled 2016-11-11: qty 5

## 2016-11-11 MED ORDER — ENOXAPARIN SODIUM 40 MG/0.4ML ~~LOC~~ SOLN: 40.0000 mg | SUBCUTANEOUS | Status: DC

## 2016-11-11 MED ORDER — DORZOLAMIDE HCL-TIMOLOL MAL PF 22.3-6.8 MG/ML OP SOLN
1.0000 [drp] | Freq: Two times a day (BID) | OPHTHALMIC | Status: DC
  Administered 2016-11-11 – 2016-11-12 (×2): 1 [drp] via OPHTHALMIC
  Filled 2016-11-11 (×4): qty 0.1

## 2016-11-11 MED ORDER — AMLODIPINE BESYLATE 10 MG PO TABS
10.0000 mg | ORAL_TABLET | Freq: Every day | ORAL | Status: DC
  Filled 2016-11-11: qty 1

## 2016-11-11 MED ORDER — ONDANSETRON HCL 4 MG PO TABS
4.0000 mg | ORAL_TABLET | Freq: Four times a day (QID) | ORAL | Status: DC | PRN
Start: 2016-11-11 — End: 2016-11-12

## 2016-11-11 MED ORDER — SODIUM CHLORIDE 0.9 % IV SOLN
INTRAVENOUS | Status: DC
  Administered 2016-11-11 – 2016-11-12 (×2): via INTRAVENOUS

## 2016-11-11 MED ORDER — LOSARTAN POTASSIUM 50 MG PO TABS
100.0000 mg | ORAL_TABLET | Freq: Every day | ORAL | Status: DC
  Administered 2016-11-12: 100 mg via ORAL
  Filled 2016-11-11: qty 2

## 2016-11-11 MED ORDER — NIACIN 500 MG PO TABS
500.0000 mg | ORAL_TABLET | Freq: Every day | ORAL | Status: DC
  Filled 2016-11-11 (×2): qty 1

## 2016-11-11 MED ORDER — ACETAMINOPHEN 325 MG PO TABS: 650.0000 mg | ORAL_TABLET | Freq: Four times a day (QID) | ORAL | Status: DC | PRN

## 2016-11-11 MED ORDER — LORATADINE 10 MG PO TABS
10.0000 mg | ORAL_TABLET | Freq: Every day | ORAL | Status: DC
  Administered 2016-11-12: 10 mg via ORAL
  Filled 2016-11-11: qty 1

## 2016-11-11 MED ORDER — SIMVASTATIN 20 MG PO TABS
20.0000 mg | ORAL_TABLET | Freq: Every day | ORAL | Status: DC
  Filled 2016-11-11: qty 1

## 2016-11-11 MED ORDER — ADULT MULTIVITAMIN W/MINERALS CH
1.0000 | ORAL_TABLET | Freq: Every day | ORAL | Status: DC
  Administered 2016-11-12: 1 via ORAL
  Filled 2016-11-11: qty 1

## 2016-11-11 MED ORDER — ACETAMINOPHEN 650 MG RE SUPP: 650.0000 mg | Freq: Four times a day (QID) | RECTAL | Status: DC | PRN

## 2016-11-11 MED ORDER — SODIUM CHLORIDE 0.9 % IV BOLUS (SEPSIS)
1000.0000 mL | Freq: Once | INTRAVENOUS | Status: AC
  Administered 2016-11-11: 1000 mL via INTRAVENOUS

## 2016-11-11 MED ORDER — PIOGLITAZONE HCL 30 MG PO TABS
30.0000 mg | ORAL_TABLET | Freq: Every day | ORAL | Status: DC
  Administered 2016-11-12: 30 mg via ORAL
  Filled 2016-11-11 (×2): qty 1

## 2016-11-11 MED ORDER — BRIMONIDINE TARTRATE 0.2 % OP SOLN
1.0000 [drp] | Freq: Two times a day (BID) | OPHTHALMIC | Status: DC
  Administered 2016-11-11 – 2016-11-12 (×2): 1 [drp] via OPHTHALMIC
  Filled 2016-11-11 (×2): qty 5

## 2016-11-11 MED ORDER — ASPIRIN 81 MG PO CHEW
81.0000 mg | CHEWABLE_TABLET | Freq: Every day | ORAL | Status: DC
  Filled 2016-11-11: qty 1

## 2016-11-11 NOTE — ED Notes (Signed)
Pt arrived via EMS. Dr. Reita Cliche at bedside upon EMS arrival.

## 2016-11-11 NOTE — ED Triage Notes (Signed)
Pt presents to ED c/o syncopal episode with LOC at church today. Pt states he felt hot and then passed out. Pt received 400 ml IV fluid PTA

## 2016-11-11 NOTE — ED Provider Notes (Signed)
[facility] Emergency Department Provider Note ____________________________________________   I have reviewed the triage vital signs and the triage nursing note.  HISTORY  Chief Complaint Loss of Consciousness   Historian Patient  HPI Roberto Leonard is a 76 y.o. male brought in by EMS from church where he had a full syncope. He was apparently sitting in the pew when he felt hot and lightheaded and then passed out with his head and eyes going back without consciousness and came back around fairly quickly. Denied any chest pain before after. He swelling a little nauseated right now. He felt a little dizzy when they stood him up, but she did not have orthostatics performed.  States he receives she cul-de-sac seen yesterday and has a mild sore left arm. He also mowed the lawn and heat yesterday.  This morning he woke up feeling like he felt okay.  No fevers or recent illnesses such as any chest pain or coughing or trouble breathing. No recent vomiting or diarrhea or abdominal pain. No current symptoms other than feeling a little weak all over and mild nausea right now.  Reports several years ago saw a neurologist and was diagnosed with an episode of transient global amnesia.  Symptoms are moderate. Nothing makes it worse or better.    Past Medical History:  Diagnosis Date  . Allergy   . Detached retina    RIGHT  . Diabetes mellitus without complication (HCC)    ORAL MED  . Dupuytren's contracture   . ED (erectile dysfunction)   . GERD (gastroesophageal reflux disease)   . Glaucoma   . Hyperlipidemia   . Hypertension     Patient Active Problem List   Diagnosis Date Noted  . Memory loss 04/30/2015  . Special screening for malignant neoplasms, colon   . Benign neoplasm of sigmoid colon   . Allergic rhinitis 09/24/2014  . 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 Cheilitis 09/24/2014  . Abnormal prostate  specific antigen 09/24/2014  . Erectile dysfunction associated with type 2 diabetes mellitus ([place]) 09/24/2014  . 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 Essential (primary) hypertension 09/24/2014  . Acid reflux 09/24/2014  . Glaucoma 09/24/2014  . Inguinal hernia 09/24/2014  . Hypercholesteremia 09/24/2014  . Detached retina 09/24/2014  . 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 CATARACT EXTRACTION     right  . COLONOSCOPY WITH PROPOFOL N/A 01/03/2015   Procedure: COLONOSCOPY WITH PROPOFOL;  Surgeon: Lucilla Lame, MD;  Location: [place];  Service: Endoscopy;  Laterality: N/A;  DIABETIC-ORAL MEDS  . HERNIA REPAIR    . POLYPECTOMY N/A 01/03/2015   Procedure: POLYPECTOMY;  Surgeon: Lucilla Lame, MD;  Location: [place];  Service: Endoscopy;  Laterality: N/A;  Sigmoid polyp  . RETINAL DETACHMENT SURGERY     right eye, wrong size lense, secondary surgery to replace it    Prior to Admission medications   Medication Sig Start Date End Date Taking? Authorizing Provider  amLODipine (NORVASC) 10 MG tablet TAKE 1 TABLET BY MOUTH DAILY EACH EVENING 04/02/16   [name]., MD  aspirin EC 325 MG tablet Take 1 tablet (325 mg total) by mouth daily. 05/01/15   Henreitta Leber, MD  brimonidine (ALPHAGAN) 0.2 % ophthalmic solution Place 1 drop into both eyes 2 (two) times daily. 04/11/15   [provider]  Cetirizine  HCl 10 MG CAPS Take 10 mg by mouth daily.  03/26/11   [provider]  Cholecalciferol ([employer]S VIT D 5000 HIGH-POTENCY) 5000 UNITS capsule Take 5,000 Units by mouth daily.    [provider]  dorzolamide-timolol (COSOPT) 22.3-6.8 MG/ML ophthalmic solution Place 1 drop into both eyes 2 (two) times daily.    [provider]  doxycycline (VIBRA-TABS) 100 MG tablet Take 1 tablet (100 mg total) by mouth 2 (two) times daily. 07/03/16    [name]., MD  losartan (COZAAR) 100 MG tablet TAKE 1 TABLET (100 MG TOTAL) BY MOUTH DAILY IN THE MORNING 03/14/16   [name]., MD  MULTIPLE VITAMIN PO Take 1 tablet by mouth daily. 03/26/11   [provider]  niacin 500 MG tablet Take 1 tablet by mouth daily. 03/26/11   [provider]  Omega-3 Fatty Acids (FISH OIL BURP-LESS) 1200 MG CAPS Take 2 capsules by mouth daily. 03/26/11   [provider]  omeprazole (PRILOSEC) 20 MG capsule TAKE 1 CAPSULE (20 MG TOTAL) BY MOUTH DAILY. 03/05/16   [name]., MD  pioglitazone (ACTOS) 30 MG tablet Take 1 tablet (30 mg total) by mouth daily. AM 11/24/15   [name]., MD  simvastatin (ZOCOR) 20 MG tablet TAKE 1 TABLET BY MOUTH DAILY 04/02/16   [name]., MD    Allergies  Allergen Reactions  . Sulfa Antibiotics Other (See Comments)    SWEATS, bad headache    Family History  Problem Relation Age of Onset  . Heart disease Mother        Died from CHF  . Diabetes Mother   . Diabetes Father   . Hypertension Father   . Heart disease Father        plaque build up  . Diabetes Brother   . Diabetes Daughter        type 1    Social History Social History  Substance Use Topics  . Smoking status: Former Smoker    Packs/day: 0.50    Years: 2.00    Types: Cigarettes    Quit date: 05/14/1963  . Smokeless tobacco: Never Used  . Alcohol use No    Review of Systems  Constitutional: Negative for fever. Eyes: Negative for visual changes. ENT: Negative for sore throat. Cardiovascular: Negative for chest pain. Respiratory: Negative for shortness of breath. Gastrointestinal: Negative for abdominal pain, vomiting and diarrhea. Genitourinary: Negative for dysuria. Musculoskeletal: Negative for back pain. Skin: Negative for rash. Neurological: Negative for headache.  ____________________________________________   PHYSICAL EXAM:  VITAL SIGNS: ED Triage Vitals   Enc Vitals Group     BP      Pulse      Resp      Temp      Temp src      SpO2      Weight      Height      Head Circumference      Peak Flow      Pain Score      Pain Loc      Pain Edu?      Excl. in [place]?      Constitutional: Alert and oriented. Well appearing and in no distress. HEENT   Head: Normocephalic and atraumatic.      Eyes: Conjunctivae are normal. Pupils equal and round.       Ears:         Nose: No congestion/rhinnorhea.  Mouth/Throat: Mucous membranes are Mildly dry.   Neck: No stridor. Cardiovascular/Chest: Normal rate, regular rhythm.  No murmurs, rubs, or gallops. Respiratory: Normal respiratory effort without tachypnea nor retractions. Breath sounds are clear and equal bilaterally. No wheezes/rales/rhonchi. Gastrointestinal: Soft. No distention, no guarding, no rebound. Nontender.    Genitourinary/rectal:Deferred Musculoskeletal: Nontender with normal range of motion in all extremities. No joint effusions.  No lower extremity tenderness.  No edema. Neurologic:  Normal speech and language. No gross or focal neurologic deficits are appreciated. Skin:  Skin is warm, dry and intact. No rash noted. Psychiatric: Mood and affect are normal. Speech and behavior are normal. Patient exhibits appropriate insight and judgment.   ____________________________________________  LABS (pertinent positives/negatives)  Labs Reviewed  COMPREHENSIVE METABOLIC PANEL - Abnormal; Notable for the following:       Result Value   Glucose, Bld 134 (*)    BUN 24 (*)    Calcium 8.5 (*)    Anion gap 3 (*)    All other components within normal limits  CBC WITH DIFFERENTIAL/PLATELET - Abnormal; Notable for the following:    Platelets 136 (*)    Neutro Abs 6.8 (*)    All other components within normal limits  TROPONIN I  URINALYSIS, COMPLETE (UACMP) WITH MICROSCOPIC    ____________________________________________    EKG I, Lisa Roca, MD, the attending  physician have personally viewed and interpreted all ECGs.  60 bpm. Normal sinus rhythm with premature atrial contraction. Right bundle branch block. Nonspecific ST and T-wave without ST segment elevation.  Right bundle branch block is new from prior EKG in the system from 2016 ____________________________________________  RADIOLOGY All Xrays were viewed by me. Imaging interpreted by Radiologist.  Chest x-ray portable:  IMPRESSION: Minimal atelectasis versus scarring at RIGHT middle lobe.  Aortic Atherosclerosis (ICD10-I70.0).  CT head without contrast:  IMPRESSION: Normal exam. __________________________________________  PROCEDURES  Procedure(s) performed: None  Critical Care performed: None  ____________________________________________   ED COURSE / ASSESSMENT AND PLAN  Pertinent labs & imaging results that were available during my care of the patient were reviewed by me and considered in my medical decision making (see chart for details).   Mr. Brostrom was brought in after a syncopal episode while seated, currently feels a little weak and mild nausea without any focal neurologic complaints or findings on exam. No chest pain. EMS EKG showed a bundle branch block without certain ischemic findings.  Laboratory and imaging evaluation are all reassuring.  HR incr 13 from lying to standing.  Suspect most likely some component of dehydration/orthostasis, however, given this episode happened while seated, and his age (32), and new RBBB from prior ECG, I think observation admission is warranted.  Patient initially quite resistant to being admitted to the hospital, I spoke with Dr. Caryl Comes, cardiology who agreed to see the patient in the emergency department, and patient was agreeable to hospital admission. I spoke with the hospitalist for admission.  CONSULTATIONS:   Dr. Caryl Comes, [facility] cardiology - saw patient in the ER and will follow in the hospital.  Hospitalist for  admission.   Patient / Family / Caregiver informed of clinical course, medical decision-making process, and agree with plan.   ___________________________________________   FINAL CLINICAL IMPRESSION(S) / ED DIAGNOSES   Final diagnoses:  Syncope, unspecified syncope type              Note: This dictation was prepared with Dragon dictation. Any transcriptional errors that result from this process are unintentional    [place],  Wells Guiles, MD 11/11/16 1728

## 2016-11-11 NOTE — H&P (Signed)
[place] [facility] [place] NAME: Roberto Leonard    MR#:  [id number]  DATE OF BIRTH:  February 18, 1941  DATE OF ADMISSION:  11/11/2016  PRIMARY CARE PHYSICIAN: [name]., MD   REQUESTING/REFERRING PHYSICIAN: Dr. Lisa Roca  CHIEF COMPLAINT:   Chief Complaint  Patient presents with  . Loss of Consciousness    HISTORY OF PRESENT ILLNESS:  Roberto Leonard  is a 76 y.o. male with a known history of Hypertension, hyperlipidemia, glaucoma, non-insulin-dependent diabetes mellitus, GERD presents to hospital after a syncopal episode. Patient said that he felt fine this morning, went to church and was sitting at [employer]. Felt hot suddenly from an at and next thing he knew he passed out. His daughter who was present at the time said that he was out for almost 2 minutes. He was looking grey instead of pale. He was significantly diaphoretic at the time. When he woke up he started having some dry heaves. Denies any chest pain. No prior cardiac history or prior syncopal episodes. Had a zoster shot yesterday and didn't sleep well last night due to soreness of the left arm. Other than that no other significant complaints. Hasn't been ill recently. His EKG shows a new right bundle branch block compared to his last EKG 2 years ago which was normal sinus rhythm. Seen by EP cardiologist Dr. Caryl Comes who has recommended overnight observation admission and echocardiogram.  PAST MEDICAL HISTORY:   Past Medical History:  Diagnosis Date  . Allergy   . Detached retina    RIGHT  . Diabetes mellitus without complication (HCC)    ORAL MED  . Dupuytren's contracture   . ED (erectile dysfunction)   . GERD (gastroesophageal reflux disease)   . Glaucoma   . Hyperlipidemia   . Hypertension     PAST SURGICAL HISTORY:   Past Surgical History:  Procedure Laterality Date  . CATARACT EXTRACTION     right  . COLONOSCOPY WITH PROPOFOL N/A 01/03/2015   Procedure:  COLONOSCOPY WITH PROPOFOL;  Surgeon: Lucilla Lame, MD;  Location: [facility];  Service: Endoscopy;  Laterality: N/A;  DIABETIC-ORAL MEDS  . HERNIA REPAIR    . POLYPECTOMY N/A 01/03/2015   Procedure: POLYPECTOMY;  Surgeon: Lucilla Lame, MD;  Location: [facility];  Service: Endoscopy;  Laterality: N/A;  Sigmoid polyp  . RETINAL DETACHMENT SURGERY     right eye, wrong size lense, secondary surgery to replace it    SOCIAL HISTORY:   Social History  Substance Use Topics  . Smoking status: Former Smoker    Packs/day: 0.50    Years: 2.00    Types: Cigarettes    Quit date: 05/14/1963  . Smokeless tobacco: Never Used  . Alcohol use No    FAMILY HISTORY:   Family History  Problem Relation Age of Onset  . Heart disease Mother        Died from CHF  . Diabetes Mother   . Diabetes Father   . Hypertension Father   . Heart disease Father        plaque build up  . Diabetes Brother   . Diabetes Daughter        type 1    DRUG ALLERGIES:   Allergies  Allergen Reactions  . Sulfa Antibiotics Other (See Comments)    SWEATS, bad headache    REVIEW OF SYSTEMS:   Review of Systems  Constitutional: Positive for diaphoresis. Negative for chills, fever, malaise/fatigue and  weight loss.  HENT: Negative for ear discharge, ear pain, hearing loss, nosebleeds and tinnitus.   Eyes: Negative for blurred vision, double vision and photophobia.  Respiratory: Negative for cough, hemoptysis, shortness of breath and wheezing.   Cardiovascular: Negative for chest pain, palpitations, orthopnea and leg swelling.  Gastrointestinal: Positive for nausea. Negative for abdominal pain, constipation, diarrhea, heartburn, melena and vomiting.  Genitourinary: Negative for dysuria, frequency, hematuria and urgency.  Musculoskeletal: Negative for back pain, myalgias and neck pain.  Skin: Negative for rash.  Neurological: Negative for dizziness, tingling, tremors, sensory change, speech change, focal  weakness and headaches.       Syncope  Endo/Heme/Allergies: Does not bruise/bleed easily.  Psychiatric/Behavioral: Negative for depression.    MEDICATIONS AT HOME:   Prior to Admission medications   Medication Sig Start Date End Date Taking? Authorizing Provider  amLODipine (NORVASC) 10 MG tablet TAKE 1 TABLET BY MOUTH DAILY EACH EVENING 04/02/16  Yes [name]., MD  aspirin EC 325 MG tablet Take 1 tablet (325 mg total) by mouth daily. 05/01/15  Yes Henreitta Leber, MD  brimonidine (ALPHAGAN) 0.2 % ophthalmic solution Place 1 drop into both eyes 2 (two) times daily. 04/11/15  Yes [provider]  Cetirizine HCl 10 MG CAPS Take 10 mg by mouth daily.  03/26/11  Yes [provider]  Cholecalciferol ([employer] VIT D 5000 HIGH-POTENCY) 5000 UNITS capsule Take 5,000 Units by mouth daily.   Yes [provider]  dorzolamide-timolol (COSOPT) 22.3-6.8 MG/ML ophthalmic solution Place 1 drop into both eyes 2 (two) times daily.   Yes [provider]  doxycycline (VIBRA-TABS) 100 MG tablet Take 1 tablet (100 mg total) by mouth 2 (two) times daily. 07/03/16  Yes [name]., MD  losartan (COZAAR) 100 MG tablet TAKE 1 TABLET (100 MG TOTAL) BY MOUTH DAILY IN THE MORNING 03/14/16  Yes [name]., MD  MULTIPLE VITAMIN PO Take 1 tablet by mouth daily. 03/26/11  Yes [provider]  niacin 500 MG tablet Take 1 tablet by mouth daily. 03/26/11  Yes [provider]  Omega-3 Fatty Acids (FISH OIL BURP-LESS) 1200 MG CAPS Take 2 capsules by mouth daily. 03/26/11  Yes [provider]  omeprazole (PRILOSEC) 20 MG capsule TAKE 1 CAPSULE (20 MG TOTAL) BY MOUTH DAILY. 03/05/16  Yes [name]., MD  pioglitazone (ACTOS) 30 MG tablet Take 1 tablet (30 mg total) by mouth daily. AM 11/24/15  Yes [name]., MD  simvastatin (ZOCOR) 20 MG tablet TAKE 1 TABLET BY MOUTH DAILY 04/02/16  Yes [name]., MD        VITAL SIGNS:  Blood pressure 124/70, pulse 80, temperature 97.7 F (36.5 C), temperature source Oral, resp. rate 17, height 6\' 2"  (1.88 m), weight 90.7 kg (200 lb), SpO2 96 %.  PHYSICAL EXAMINATION:   Physical Exam  GENERAL:  76 y.o.-year-old patient lying in the bed with no acute distress.  EYES: Pupils equal, round, reactive to light and accommodation. No scleral icterus. Extraocular muscles intact.  HEENT: Head atraumatic, normocephalic. Oropharynx and nasopharynx clear.  NECK:  Supple, no jugular venous distention. No thyroid enlargement, no tenderness.  LUNGS: Normal breath sounds bilaterally, no wheezing, rales,rhonchi or crepitation. No use of accessory muscles of respiration.  CARDIOVASCULAR: S1, S2 normal. No murmurs, rubs, or gallops.  ABDOMEN: Soft, nontender, nondistended. Bowel sounds present. No organomegaly or mass.  EXTREMITIES: No pedal edema, cyanosis, or clubbing.  NEUROLOGIC: Cranial nerves  II through XII are intact. Muscle strength 5/5 in all extremities. Sensation intact. Gait not checked.  PSYCHIATRIC: The patient is alert and oriented x 3.  SKIN: No obvious rash, lesion, or ulcer.   LABORATORY PANEL:   CBC  Recent Labs Lab 11/11/16 1306  WBC 9.0  HGB 14.0  HCT 41.7  PLT 136*   ------------------------------------------------------------------------------------------------------------------  Chemistries   Recent Labs Lab 11/11/16 1306  NA 137  K 3.6  CL 108  CO2 26  GLUCOSE 134*  BUN 24*  CREATININE 0.77  CALCIUM 8.5*  AST 22  ALT 21  ALKPHOS 58  BILITOT 0.9   ------------------------------------------------------------------------------------------------------------------  Cardiac Enzymes  Recent Labs Lab 11/11/16 1306  TROPONINI <0.03   ------------------------------------------------------------------------------------------------------------------  RADIOLOGY:  Ct Head Wo Contrast  Result Date: 11/11/2016 CLINICAL DATA:   Syncope, loss of consciousness at church today, felt hot and passed out, no injury, history hypertension, diabetes mellitus, former smoker EXAM: CT HEAD WITHOUT CONTRAST TECHNIQUE: Contiguous axial images were obtained from the base of the skull through the vertex without intravenous contrast. Sagittal and coronal MPR images reconstructed from axial data set. COMPARISON:  04/30/2015 FINDINGS: Brain: Normal ventricular morphology. No midline shift or mass effect. Normal appearance of brain parenchyma. No intracranial hemorrhage, mass lesion, or evidence acute infarction. No extra-axial fluid collections. Vascular: Normal appearance Skull: Normal appearance Sinuses/Orbits: Normal appearance Other: N/A IMPRESSION: Normal exam. Electronically Signed   By: Lavonia Dana M.D.   On: 11/11/2016 13:26   Dg Chest Port 1 View  Result Date: 11/11/2016 CLINICAL DATA:  Syncope at church this afternoon, history hypertension, diabetes mellitus, former smoker EXAM: PORTABLE CHEST 1 VIEW COMPARISON:  Portable exam 1322 hours compared to 07/03/2016 FINDINGS: Normal heart size, mediastinal contours, and pulmonary vascularity. Minimal atelectasis versus scarring at RIGHT middle lobe. Lungs otherwise clear. No pleural effusion or pneumothorax. Azygos fissure noted. Minimal atherosclerotic calcification aorta. Bones unremarkable. IMPRESSION: Minimal atelectasis versus scarring at RIGHT middle lobe. Aortic Atherosclerosis (ICD10-I70.0). Electronically Signed   By: Lavonia Dana M.D.   On: 11/11/2016 13:43    EKG:   Orders placed or performed during the hospital encounter of 11/11/16  . ED EKG  . ED EKG  . EKG 12-Lead  . EKG 12-Lead    IMPRESSION AND PLAN:   Roberto Leonard  is a 76 y.o. male with a known history of Hypertension, hyperlipidemia, glaucoma, non-insulin-dependent diabetes mellitus, GERD presents to hospital after a syncopal episode.  #1 syncope-vasovagal versus cardiogenic. -Appreciate cardiology consult.  Echocardiogram ordered. -Admit under observation, troponins 3. Telemetry monitoring. -Gentle IV hydration. Check urinalysis to rule out infection.  #2 hypertension-continue home medications. Patient on Norvasc and losartan.  #3 diabetes mellitus-has been on Actos. Continue that. Last known A1c was 5.3  #4 hyperlipidemia-also has strong family history of hypertriglyceridemia. Continue statin, any aspirin and fish oil supplements.  #5 DVT prophylaxis-Lovenox    All the records are reviewed and case discussed with ED provider. Management plans discussed with the patient, family and they are in agreement.  CODE STATUS: Full code  TOTAL TIME TAKING CARE OF THIS PATIENT: 50 minutes.    Gladstone Lighter M.D on 11/11/2016 at 6:14 PM  Between 7am to 6pm - Pager - [phone]  After 6pm go to [email or web address] - password [email or web address]  Office  [phone]  CC: Primary care physician; [name]., MD

## 2016-11-11 NOTE — Progress Notes (Signed)
Patient is admitted to room 243 with the diagnosis of syncopy. Alert and oriented x 4. Denied any acute pain. No respiratory distress noted. Tele box verified by Gelene Mink RN and Christine NT. Skin assessment done with Gelene Mink. RN, no skin issues to report. Fall contract signed and password was set up.  Patient refused his bedtime medications except his 2 eye drops and NS infusing. Patient indicated he has already taken all of them.  Family at bedside voiced no concerns. Will continue to monitor.

## 2016-11-11 NOTE — Consult Note (Addendum)
ELECTROPHYSIOLOGY CONSULT NOTE  Patient ID: Roberto Leonard, MRN: [id number], DOB/AGE: 1941/03/15 76 y.o. Admit date: 11/11/2016 Date of Consult: 11/11/2016  Primary Physician: [name]., MD Primary Cardiologist: new   Roberto Leonard is being seen today for the evaluation of syncope at the request of  Dr Reita Cliche   HPI Roberto Leonard is a 76 y.o. male  Seen for syncope Was sitting in church when aburptly noted to be snoring; then seen to be losing tone.  Water quality scientist described him as grey, not white.  Eyes rolled back, stiffened and then gradually regained consciousness--diaphoretic, limp and weak.  Dry heaves  Yesterday was working out in the yard in the heat.  Labs notable for Bun/Cr    24/0.77<<<< 19/0.73; UA pending   No prior syncope or presyncope  The patient denies chest pain, shortness of breath, nocturnal dyspnea, orthopnea or peripheral edema.  There have been no palpitations, lightheadedness or syncope.   ECG compared to 12/16 new RBBB   HTN  Hx of prediabetes-- on Rx for hgbA1c 6.2 2017  Admitted 12/16 for acute TG  amnesia.  W/u included CT/MRI neg Echo 2016  Normal LV function Carotid <50% R/L ICA    Past Medical History:  Diagnosis Date  . Allergy   . Detached retina    RIGHT  . Diabetes mellitus without complication (HCC)    ORAL MED  . Dupuytren's contracture   . ED (erectile dysfunction)   . GERD (gastroesophageal reflux disease)   . Glaucoma   . Hyperlipidemia   . Hypertension       Surgical History:  Past Surgical History:  Procedure Laterality Date  . CATARACT EXTRACTION     right  . COLONOSCOPY WITH PROPOFOL N/A 01/03/2015   Procedure: COLONOSCOPY WITH PROPOFOL;  Surgeon: Lucilla Lame, MD;  Location: [facility];  Service: Endoscopy;  Laterality: N/A;  DIABETIC-ORAL MEDS  . HERNIA REPAIR    . POLYPECTOMY N/A 01/03/2015   Procedure: POLYPECTOMY;  Surgeon: Lucilla Lame, MD;  Location: [facility];   Service: Endoscopy;  Laterality: N/A;  Sigmoid polyp  . RETINAL DETACHMENT SURGERY     right eye, wrong size lense, secondary surgery to replace it     Home Meds: Prior to Admission medications   Medication Sig Start Date End Date Taking? Authorizing Provider  amLODipine (NORVASC) 10 MG tablet TAKE 1 TABLET BY MOUTH DAILY EACH EVENING 04/02/16   [name]., MD  aspirin EC 325 MG tablet Take 1 tablet (325 mg total) by mouth daily. 05/01/15   Henreitta Leber, MD  brimonidine (ALPHAGAN) 0.2 % ophthalmic solution Place 1 drop into both eyes 2 (two) times daily. 04/11/15   [provider]  Cetirizine HCl 10 MG CAPS Take 10 mg by mouth daily.  03/26/11   [provider]  Cholecalciferol (CVS VIT D 5000 HIGH-POTENCY) 5000 UNITS capsule Take 5,000 Units by mouth daily.    [provider]  dorzolamide-timolol (COSOPT) 22.3-6.8 MG/ML ophthalmic solution Place 1 drop into both eyes 2 (two) times daily.    [provider]  doxycycline (VIBRA-TABS) 100 MG tablet Take 1 tablet (100 mg total) by mouth 2 (two) times daily. 07/03/16   [name]., MD  losartan (COZAAR) 100 MG tablet TAKE 1 TABLET (100 MG TOTAL) BY MOUTH DAILY IN THE MORNING 03/14/16   [name]., MD  MULTIPLE VITAMIN PO Take 1 tablet by mouth daily.  03/26/11   [provider]  niacin 500 MG tablet Take 1 tablet by mouth daily. 03/26/11   [provider]  Omega-3 Fatty Acids (FISH OIL BURP-LESS) 1200 MG CAPS Take 2 capsules by mouth daily. 03/26/11   [provider]  omeprazole (PRILOSEC) 20 MG capsule TAKE 1 CAPSULE (20 MG TOTAL) BY MOUTH DAILY. 03/05/16   [name]., MD  pioglitazone (ACTOS) 30 MG tablet Take 1 tablet (30 mg total) by mouth daily. AM 11/24/15   [name]., MD  simvastatin (ZOCOR) 20 MG tablet TAKE 1 TABLET BY MOUTH DAILY 04/02/16   [name]., MD    Allergies:  Allergies  Allergen Reactions    . Sulfa Antibiotics Other (See Comments)    SWEATS, bad headache    Social History   Social History  . Marital status: Married    Spouse name: Pricilla  . Number of children: 3  . Years of education: college   Occupational History  . Retired   . 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 Smoking status: Former Smoker    Packs/day: 0.50    Years: 2.00    Types: Cigarettes    Quit date: 05/14/1963  . Smokeless tobacco: Never Used  . Alcohol use No  . Drug use: No  . Sexual activity: Not on file   Other Topics Concern  . Not on file   Social History Narrative  . No narrative on file     Family History  Problem Relation Age of Onset  . Heart disease Mother        Died from CHF  . Diabetes Mother   . Diabetes Father   . Hypertension Father   . Heart disease Father        plaque build up  . Diabetes Brother   . Diabetes Daughter        type 1     ROS:  Please see the history of present illness.     All other systems reviewed and negative.    Physical Exam: Blood pressure (!) 122/57, pulse 77, temperature 97.7 F (36.5 C), temperature source Oral, resp. rate 19, height 6\' 2"  (1.88 m), weight 200 lb (90.7 kg), SpO2 96 %. General: Well developed, well nourished male in no acute distress. Head: Normocephalic, atraumatic, sclera non-icteric, no xanthomas, nares are without discharge. EENT: normal  Lymph Nodes:  none Neck: Negative for carotid bruits. JVD not elevated. Carotid sinus massage + Back:without scoliosis kyphosis Lungs: Clear bilaterally to auscultation without wheezes, rales, or rhonchi. Breathing is unlabored. Heart: RRR with S1 S2. No  murmur . No rubs, or gallops appreciated. Abdomen: Soft, non-tender, non-distended with normoactive bowel sounds. No hepatomegaly. No rebound/guarding. No obvious abdominal masses. Msk:  Strength and tone appear normal for age. Extremities: No  clubbing or cyanosis. No  edema.  Distal pedal pulses are 2+ and equal bilaterally. Skin: Warm and Dry Neuro: Alert and oriented X 3. CN III-XII intact Grossly normal sensory and motor function . Psych:  Responds to questions appropriately with a normal affect.      Labs: Cardiac Enzymes  Recent Labs  11/11/16 1306  TROPONINI <0.03   CBC Lab Results  Component Value Date   WBC 9.0 11/11/2016   HGB 14.0 11/11/2016   HCT 41.7 11/11/2016   MCV 87.2 11/11/2016   PLT 136 (L)  11/11/2016   PROTIME: No results for input(s): LABPROT, INR in the last 72 hours. Chemistry  Recent Labs Lab 11/11/16 1306  NA 137  K 3.6  CL 108  CO2 26  BUN 24*  CREATININE 0.77  CALCIUM 8.5*  PROT 6.5  BILITOT 0.9  ALKPHOS 58  ALT 21  AST 22  GLUCOSE 134*   Lipids Lab Results  Component Value Date   CHOL 117 11/14/2015   HDL 39 (L) 11/14/2015   LDLCALC 63 11/14/2015   TRIG 75 11/14/2015   BNP No results found for: PROBNP Thyroid Function Tests: No results for input(s): TSH, T4TOTAL, T3FREE, THYROIDAB in the last 72 hours.  Invalid input(s): FREET3 Miscellaneous No results found for: DDIMER  Radiology/Studies:  Ct Head Wo Contrast  Result Date: 11/11/2016 CLINICAL DATA:  Syncope, loss of consciousness at church today, felt hot and passed out, no injury, history hypertension, diabetes mellitus, former smoker EXAM: CT HEAD WITHOUT CONTRAST TECHNIQUE: Contiguous axial images were obtained from the base of the skull through the vertex without intravenous contrast. Sagittal and coronal MPR images reconstructed from axial data set. COMPARISON:  04/30/2015 FINDINGS: Brain: Normal ventricular morphology. No midline shift or mass effect. Normal appearance of brain parenchyma. No intracranial hemorrhage, mass lesion, or evidence acute infarction. No extra-axial fluid collections. Vascular: Normal appearance Skull: Normal appearance Sinuses/Orbits: Normal appearance Other: N/A IMPRESSION: Normal  exam. Electronically Signed   By: Lavonia Dana M.D.   On: 11/11/2016 13:26   Dg Chest Port 1 View  Result Date: 11/11/2016 CLINICAL DATA:  Syncope at church this afternoon, history hypertension, diabetes mellitus, former smoker EXAM: PORTABLE CHEST 1 VIEW COMPARISON:  Portable exam 1322 hours compared to 07/03/2016 FINDINGS: Normal heart size, mediastinal contours, and pulmonary vascularity. Minimal atelectasis versus scarring at RIGHT middle lobe. Lungs otherwise clear. No pleural effusion or pneumothorax. Azygos fissure noted. Minimal atherosclerotic calcification aorta. Bones unremarkable. IMPRESSION: Minimal atelectasis versus scarring at RIGHT middle lobe. Aortic Atherosclerosis (ICD10-I70.0). Electronically Signed   By: Lavonia Dana M.D.   On: 11/11/2016 13:43    EKG: sinus with RBBB  New from 2016   Assessment and Plan:  Syncope  RBBB  HTN  BP well controlled ( on multiple meds )  Abrupt onset syncope with some recovery symptoms suggestive of neurally mediated event, and Bun/Cr ratio suggestive of intravascular depletion as potential triggering situation.  However, was seated, event very rapid in onset, described by HPatrol as grey not white worrisome features for cardiac event.    His new RBBB begs then the question of intertmitent complete heart block as mechanism, and ISSUE trial suggests 50% of such pts will have pause detected on recurrent syncope prompting need for Pacing  PRESS TRial and new guidelines support pacing pts with BBB and syncope as first therapy.  His carotid sinus hypersensitivity is without symptoms so not likely playing a role  The other concern is structural heart disease in this 76 yo man with new RBBB.  Hence echo prior to disposition is important and overnight tele is reasonable part of guidelines\  Would decrease ASA to 81  No clear indication for 325   Not sure of the value of niacin--  Thanks for consult and will follow with you   No driving x 6  months      Roberto Leonard

## 2016-11-12 ENCOUNTER — Observation Stay (HOSPITAL_BASED_OUTPATIENT_CLINIC_OR_DEPARTMENT_OTHER)
Admit: 2016-11-12 | Discharge: 2016-11-12 | Disposition: A | Discharge status: Home / Self Care | Payer: PPO | Attending: Internal Medicine | Admitting: Internal Medicine

## 2016-11-12 ENCOUNTER — Telehealth: Payer: Self-pay | Admitting: Family Medicine

## 2016-11-12 ENCOUNTER — Telehealth: Payer: Self-pay | Admitting: *Deleted

## 2016-11-12 DIAGNOSIS — R55 Syncope and collapse: Secondary | ICD-10-CM

## 2016-11-12 DIAGNOSIS — I10 Essential (primary) hypertension: Secondary | ICD-10-CM | POA: Diagnosis not present

## 2016-11-12 DIAGNOSIS — I4510 Unspecified right bundle-branch block: Secondary | ICD-10-CM | POA: Diagnosis not present

## 2016-11-12 LAB — CBC
HCT: 43.1 % (ref 40.0–52.0)
HEMOGLOBIN: 14.4 g/dL (ref 13.0–18.0)
MCH: 28.9 pg (ref 26.0–34.0)
MCHC: 33.5 g/dL (ref 32.0–36.0)
MCV: 86.3 fL (ref 80.0–100.0)
Platelets: 158 10*3/uL (ref 150–440)
RBC: 5 MIL/uL (ref 4.40–5.90)
RDW: 13.1 % (ref 11.5–14.5)
WBC: 9.4 10*3/uL (ref 3.8–10.6)

## 2016-11-12 LAB — BASIC METABOLIC PANEL
ANION GAP: 7 (ref 5–15)
BUN: 14 mg/dL (ref 6–20)
CHLORIDE: 111 mmol/L (ref 101–111)
CO2: 23 mmol/L (ref 22–32)
Calcium: 8.6 mg/dL — ABNORMAL LOW (ref 8.9–10.3)
Creatinine, Ser: 0.63 mg/dL (ref 0.61–1.24)
GFR calc non Af Amer: >60 mL/min (ref >60)
Glucose, Bld: 150 mg/dL — ABNORMAL HIGH (ref 65–99)
Potassium: 3.7 mmol/L (ref 3.5–5.1)
SODIUM: 141 mmol/L (ref 135–145)

## 2016-11-12 LAB — ECHOCARDIOGRAM COMPLETE
Height: 74 in
Weight: 3200 oz

## 2016-11-12 LAB — TROPONIN I
Troponin I: <0.03 ng/mL (ref <0.03)
Troponin I: <0.03 ng/mL (ref <0.03)

## 2016-11-12 NOTE — Telephone Encounter (Signed)
Admitted

## 2016-11-12 NOTE — Care Management Obs Status (Signed)
[facility] NOTIFICATION   Patient Details  Name: Roberto Leonard MRN: [id number] Date of Birth: 1940/06/21   Medicare Observation Status Notification Given:  No (Admitted obs less than 24 hours)    Beverly Sessions, RN 11/12/2016, 4:27 PM

## 2016-11-12 NOTE — Telephone Encounter (Signed)
Patient contacted regarding discharge from [facility] on 11/12/16.  Patient understands to follow up with provider Dr. Caryl Comes on 11/13/16 at 08:30 AM at [facility]. Patient understands discharge instructions? Yes Patient understands medications and regiment? Yes  Patient understands to bring all medications to this visit? Yes  Spoke with patient at length and he confirmed appointment for tomorrow and had no further questions at this time.

## 2016-11-12 NOTE — Telephone Encounter (Signed)
-----   Message from Blain Pais sent at 11/12/2016  1:08 PM EDT ----- Regarding: tcm/ph 7/3 8:30 Dr. Caryl Comes Pt is still in [facility]. This appt per dr Luan Pulling, pa

## 2016-11-12 NOTE — Discharge Instructions (Addendum)
Resume diet and activity as tolerated  Follow up with Dr. Caryl Comes tomorrow morning

## 2016-11-12 NOTE — Telephone Encounter (Signed)
Pt is being discharged from [facility] today 11/12/16 and is scheduled for hospital f/u on 11/19/16. Thanks TNP

## 2016-11-12 NOTE — Progress Notes (Signed)
*  PRELIMINARY RESULTS* Echocardiogram 2D Echocardiogram has been performed.  Sherrie Sport 11/12/2016, 12:49 PM

## 2016-11-12 NOTE — Progress Notes (Signed)
Nutrition Brief Note  Patient identified on the Malnutrition Screening Tool (MST) Report  Wt Readings from Last 15 Encounters:  11/11/16 200 lb (90.7 kg)  07/03/16 220 lb (99.8 kg)  05/16/16 219 lb (99.3 kg)  11/14/15 208 lb (94.3 kg)  08/11/15 216 lb (98 kg)  04/30/15 203 lb 9.6 oz (92.4 kg)  02/10/15 210 lb (95.3 kg)  01/03/15 201 lb (91.2 kg)  11/04/14 211 lb (95.7 kg)  05/11/14 211 lb (95.7 kg)   Spoke with patient and his family members at bedside. He reports his appetite is good. He eats 100% of three meals daily at home. Reports his physician asked him to lose some weight a while ago, so he has been losing weight on purpose. Used to weight around 224 lbs. He reports he has lost approximately 20 lbs (8.9% body weight) over the past 3-5 months. He is following Weight [employer]. Breakfast is eggs, yogurt, fruit. Lunch is a salad. Dinner is a meat with vegetables and other sides. Denies any N/V, abdominal pain.  Nutrition-Focused physical exam completed. Findings are no fat depletion, no muscle depletion, and no edema.   Patient does not meet criteria for malnutrition at this time.  Body mass index is 25.68 kg/m. Patient meets criteria for overweight based on current BMI.   Current diet order is Heart Healthy/Carbohydrate Modified, patient is consuming approximately 100% of meals at this time. Labs and medications reviewed.   No nutrition interventions warranted at this time. If nutrition issues arise, please consult RD.   Willey Blade, MS, RD, LDN Pager: [phone] After Hours Pager: [phone]

## 2016-11-12 NOTE — Progress Notes (Signed)
Progress Note  Patient Name: Roberto Leonard Date of Encounter: 11/12/2016  Primary Cardiologist: new to [facility] - consult by Caryl Comes  Subjective   Admitted with syncope while in church 7/1. 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 No associated chest pain, SOB, nocturnal dyspnea, orthopnea, & peripheral edema / LE pain. He is eager to go home after his scheduled echo today. Seen by EP on 7/1 with recommendation for TTE to evaluate for structural heart disease with plans for PPM likely following. No arrhythmia or pauses noted on telemetry.   Echocardiogram scheduled for this morning; 11/11/16 EKG with new RBBB (compared to 12/16); Echo 05/01/2015 with normal LV function. 05/01/15 Carotid <50% R/L ICA   Troponin negative X3.UA glucose >=500, ketones 5; CBC platelets 136, neutro abs 6.8; CMP glucose 134, BUN 24-->14, SCr 0.77-->0.63, Ca 8.5, anion gap 3.  Inpatient Medications    Scheduled Meds: . amLODipine  10 mg Oral Daily  . aspirin  81 mg Oral Daily  . brimonidine  1 drop Both Eyes BID  . cholecalciferol  5,000 Units Oral Daily  . dorzolamidel-timolol  1 drop Both Eyes BID  . enoxaparin (LOVENOX) injection  40 mg Subcutaneous Q24H  . loratadine  10 mg Oral Daily  . losartan  100 mg Oral Daily  . multivitamin with minerals  1 tablet Oral Daily  . niacin  500 mg Oral Daily  . omega-3 acid ethyl esters  2 capsule Oral Daily  . pantoprazole  40 mg Oral Daily  . pioglitazone  30 mg Oral Daily  . simvastatin  20 mg Oral Daily   Continuous Infusions: . sodium chloride 75 mL/hr at 11/11/16 2143   PRN Meds: acetaminophen **OR** acetaminophen, ondansetron **OR** ondansetron (ZOFRAN) IV   Vital Signs    Vitals:   11/11/16 1915 11/11/16 1930 11/11/16 2021 11/12/16 0447  BP:  (!) 121/57 135/65 (!) 123/58  Pulse:   78 73  Resp: 15 15 18 18   Temp:   97.4 F (36.3 C) 98.2 F (36.8 C)  TempSrc:   Oral Oral  SpO2:   97% 96%  Weight:      Height:        Intake/Output Summary (Last 24 hours) at  11/12/16 4481 Last data filed at 11/11/16 1513  Gross per 24 hour  Intake             1000 ml  Output                0 ml  Net             1000 ml   Filed Weights   11/11/16 1354  Weight: 200 lb (90.7 kg)    Telemetry    NSR to bradycardia, HR 40s-90s, rare PVC - Personally Reviewed  ECG    NSR, 60 bpm, rare PAC, new RBBB (compared to 12/16)  - Personally Reviewed  Physical Exam   GEN: Patient sitting comfortably and in no acute distress.   Neck: No JVD noted on exam. Cardiac: RRR, no murmurs, rubs, or gallops.  Respiratory: Clear to auscultation bilaterally. No rales, rhonchi, wheezing. GI: Soft, nontender, non-distended. Not ttp.  MS: No edema; No deformity. No weakness.  Neuro:  Nonfocal. No tremors. Psych: Normal affect. Normal affect and responds to questions appropriately.   Labs    Chemistry Recent Labs Lab 11/11/16 1306  NA 137  K 3.6  CL 108  CO2 26  GLUCOSE 134*  BUN 24*  CREATININE 0.77  CALCIUM  8.5*  PROT 6.5  ALBUMIN 3.8  AST 22  ALT 21  ALKPHOS 58  BILITOT 0.9  GFRNONAA >60  GFRAA >60  ANIONGAP 3*     Hematology Recent Labs Lab 11/11/16 1306  WBC 9.0  RBC 4.78  HGB 14.0  HCT 41.7  MCV 87.2  MCH 29.2  MCHC 33.5  RDW 12.9  PLT 136*    Cardiac Enzymes Recent Labs Lab 11/11/16 1306 11/11/16 2024 11/12/16 0216  TROPONINI <0.03 <0.03 <0.03   No results for input(s): TROPIPOC in the last 168 hours.   BNPNo results for input(s): BNP, PROBNP in the last 168 hours.   DDimer No results for input(s): DDIMER in the last 168 hours.   Radiology    Ct Head Wo Contrast  Result Date: 11/11/2016 CLINICAL DATA:  Syncope, loss of consciousness at church today, felt hot and passed out, no injury, history hypertension, diabetes mellitus, former smoker EXAM: CT HEAD WITHOUT CONTRAST TECHNIQUE: Contiguous axial images were obtained from the base of the skull through the vertex without intravenous contrast. Sagittal and coronal MPR images  reconstructed from axial data set. COMPARISON:  04/30/2015 FINDINGS: Brain: Normal ventricular morphology. No midline shift or mass effect. Normal appearance of brain parenchyma. No intracranial hemorrhage, mass lesion, or evidence acute infarction. No extra-axial fluid collections. Vascular: Normal appearance Skull: Normal appearance Sinuses/Orbits: Normal appearance Other: N/A IMPRESSION: Normal exam. Electronically Signed   By: Lavonia Dana M.D.   On: 11/11/2016 13:26   Dg Chest Port 1 View  Result Date: 11/11/2016 CLINICAL DATA:  Syncope at church this afternoon, history hypertension, diabetes mellitus, former smoker EXAM: PORTABLE CHEST 1 VIEW COMPARISON:  Portable exam 1322 hours compared to 07/03/2016 FINDINGS: Normal heart size, mediastinal contours, and pulmonary vascularity. Minimal atelectasis versus scarring at RIGHT middle lobe. Lungs otherwise clear. No pleural effusion or pneumothorax. Azygos fissure noted. Minimal atherosclerotic calcification aorta. Bones unremarkable. IMPRESSION: Minimal atelectasis versus scarring at RIGHT middle lobe. Aortic Atherosclerosis (ICD10-I70.0). Electronically Signed   By: Lavonia Dana M.D.   On: 11/11/2016 13:43    Cardiac Studies   11/11/16 EKG new RBBB (compared to 12/16)  05/01/15 US Carotid Bilateral IMPRESSION: Less than 50% stenosis in the right and left internal carotid Arteries.  05/01/15 Echo  Study Conclusions - Left ventricle: Systolic function was normal. The estimated   ejection fraction was in the range of 55% to 65%. - Mitral valve: There was mild regurgitation. - Pulmonary arteries: PA peak pressure: 38 mm Hg (S).  04/29/17 EKG  NSR, 62 bpm  Patient Profile     76 y.o. male with hx of DM, GERD, HLD, and HTN admitted to [facility] for the evaluation of syncope and seen by Cardiology at the request of Dr Reita Cliche.  Assessment & Plan    1. Syncope - Abrupt onset 7/1 while seated at church with no current symptoms. - 11/11/16 EKG with new  RBBB compared to 12/16. Monitored with tele. - Per Dr. Caryl Comes, EKG suggestive of possible intermittent complete heart block as mechanism - ISSUE trial suggests 50% of such patients will have pause detected on recurrent syncope prompting need for pacing. PRESS trial and new guidelines support pacing pts with BBB and syncope as first therapy. - Await echo to assess for structural heart diseae. - If normal echo, EP has advised patient of possible PPM vs ILR. Patient would like to proceed with PPM pending echo. Will need to coordinate with Dr. Caryl Comes and his RN. - Carotid hypersensitivity without symptoms and  not likely a factor in syncope. - ASA decreased to 81mg  with no indication for 325mg . - Lovenox 40mg  subcutaneous inj for DVT prophylaxis - No driving X 6 months, this was again discussed with him and his family and friend present.  2. RBBB - 11/11/16 EKG with new RBBB compared to 12/16.   - Monitored overnight with tele as above. Continue to monitor. - Echo this morning to assess for structural heart disease. - Concern for structural heart disease prompts echo prior to discharge.  - ASA and lovenox as above.  - Consider outpatient cardiac monitoring as a bridge to possible PPM.  3. HTN -  At admission ranging 116-136/61-68 - Continue amlodipine tablet po 10mg  daily - Continue losartan 100mg , po, daily - Managed per IM  4. HLD - Continue simvastatin 20mg , po daily - Consider stopping niacin   5. DM -Glucose UA (>=500) and glucose CBC (134) slightly elevated -Continue pioglitazone 40mg , po, daily -Managed per IM.  6. GERD -Continue Protonix 40mg , po, daily -Managed per IM.   Signed, Marrianne Mood, [place]  11/12/2016, 8:12 AM    I have discussed and examined the patient on rounds this morning with PA-S. Changes made above as indicated.    Christell Faith, PA-C 11/12/2016 10:09 AM

## 2016-11-13 ENCOUNTER — Ambulatory Visit (INDEPENDENT_AMBULATORY_CARE_PROVIDER_SITE_OTHER): Payer: PPO | Admitting: Internal Medicine

## 2016-11-13 ENCOUNTER — Other Ambulatory Visit: Payer: Self-pay | Admitting: Family Medicine

## 2016-11-13 ENCOUNTER — Encounter: Payer: Self-pay | Admitting: Internal Medicine

## 2016-11-13 VITALS — BP 110/64 | HR 55 | Ht 74.0 in | Wt 203.5 lb

## 2016-11-13 DIAGNOSIS — R55 Syncope and collapse: Secondary | ICD-10-CM | POA: Diagnosis not present

## 2016-11-13 DIAGNOSIS — I4510 Unspecified right bundle-branch block: Secondary | ICD-10-CM | POA: Diagnosis not present

## 2016-11-13 DIAGNOSIS — I10 Essential (primary) hypertension: Secondary | ICD-10-CM

## 2016-11-13 DIAGNOSIS — E11610 Type 2 diabetes mellitus with diabetic neuropathic arthropathy: Secondary | ICD-10-CM

## 2016-11-13 NOTE — Patient Instructions (Signed)
Medication Instructions: - Your physician recommends that you continue on your current medications as directed. Please refer to the Current Medication list given to you today.  Labwork: - none ordered  Procedures/Testing: - Your physician has recommended that you have a loop recorder (LINQ) monitor implanted  -Please arrive at the [facility] entrance at 7:30 am on Thursday 11/15/16 -1st desk on the right to register -You may have a light breakfast the morning of your procedure -You may take your regular medications the morning of your procedure -Please wash your chest and neck area with an antibacterial soap (any brand) the night before and morning of your procedure  Follow-Up: - Your physician recommends that you schedule a follow-up appointment in: about 10-14 days (from 11/15/16) with the [facility] for a wound check appointment.   Any Additional Special Instructions Will Be Listed Below (If Applicable).     If you need a refill on your cardiac medications before your next appointment, please call your pharmacy.

## 2016-11-13 NOTE — Telephone Encounter (Signed)
Please review  ED 

## 2016-11-13 NOTE — Discharge Summary (Signed)
[place] [facility] [place] NAME: Roberto Leonard    MR#:  [id number]  DATE OF BIRTH:  1940-12-04  DATE OF ADMISSION:  11/11/2016 ADMITTING PHYSICIAN: Gladstone Lighter, MD  DATE OF DISCHARGE: 11/12/2016  2:46 PM  PRIMARY CARE PHYSICIAN: [name]., MD   ADMISSION DIAGNOSIS:  Syncope, unspecified syncope type [R55]  DISCHARGE DIAGNOSIS:  Active Problems:   Syncope   SECONDARY DIAGNOSIS:   Past Medical History:  Diagnosis Date  . Allergy   . Detached retina    RIGHT  . Diabetes mellitus without complication (HCC)    ORAL MED  . Dupuytren's contracture   . ED (erectile dysfunction)   . GERD (gastroesophageal reflux disease)   . Glaucoma   . Hyperlipidemia   . Hypertension      ADMITTING HISTORY   HISTORY OF PRESENT ILLNESS:  Roberto Leonard  is a 76 y.o. male with a known history of Hypertension, hyperlipidemia, glaucoma, non-insulin-dependent diabetes mellitus, GERD presents to hospital after a syncopal episode. Patient said that he felt fine this morning, went to church and was sitting at [employer]. Felt hot suddenly from an at and next thing he knew he passed out. His daughter who was present at the time said that he was out for almost 2 minutes. He was looking grey instead of pale. He was significantly diaphoretic at the time. When he woke up he started having some dry heaves. Denies any chest pain. No prior cardiac history or prior syncopal episodes. Had a zoster shot yesterday and didn't sleep well last night due to soreness of the left arm. Other than that no other significant complaints. Hasn't been ill recently. His EKG shows a new right bundle branch block compared to his last EKG 2 years ago which was normal sinus rhythm. Seen by EP cardiologist Dr. Caryl Comes who has recommended overnight observation admission and echocardiogram.   HOSPITAL COURSE:   * Syncope Patient was admitted to telemetry floor. Cardiac enzymes  checked 3 times normal. No arrhythmias or possibly is noticed on telemetry monitor. Patient was seen by Dr. Cleda Mccreedy of cardiology. Recommended an echocardiogram. Echocardiogram read and I discussed with cardiology team. Advised discharge home to follow-up with Dr. Caryl Comes tomorrow. May need a pacemaker as outpatient. Stable for discharge home. No change in medications.  CONSULTS OBTAINED:  Treatment Team:  Wellington Hampshire, MD  DRUG ALLERGIES:   Allergies  Allergen Reactions  . Sulfa Antibiotics Other (See Comments)    SWEATS, bad headache    DISCHARGE MEDICATIONS:   Discharge Medication List as of 11/12/2016  2:15 PM    CONTINUE these medications which have NOT CHANGED   Details  amLODipine (NORVASC) 10 MG tablet TAKE 1 TABLET BY MOUTH DAILY EACH EVENING, Normal    aspirin EC 325 MG tablet Take 1 tablet (325 mg total) by mouth daily., Starting Sun 05/01/2015, Print    brimonidine (ALPHAGAN) 0.2 % ophthalmic solution Place 1 drop into both eyes 2 (two) times daily., Starting Mon 04/11/2015, Historical Med    Cetirizine HCl 10 MG CAPS Take 10 mg by mouth daily. , Starting Mon 03/26/2011, Historical Med    Cholecalciferol ([employer] VIT D 5000 HIGH-POTENCY) 5000 UNITS capsule Take 5,000 Units by mouth daily., Historical Med    dorzolamide-timolol (COSOPT) 22.3-6.8 MG/ML ophthalmic solution Place 1 drop into both eyes 2 (two) times daily., Historical Med    losartan (COZAAR) 100 MG tablet TAKE 1 TABLET (100 MG TOTAL) BY MOUTH DAILY  IN THE MORNING, Normal    MULTIPLE VITAMIN PO Take 1 tablet by mouth daily., Starting Mon 03/26/2011, Historical Med    niacin 500 MG tablet Take 1 tablet by mouth daily., Starting Mon 03/26/2011, Historical Med    Omega-3 Fatty Acids (FISH OIL BURP-LESS) 1200 MG CAPS Take 2 capsules by mouth daily., Starting Mon 03/26/2011, Historical Med    omeprazole (PRILOSEC) 20 MG capsule TAKE 1 CAPSULE (20 MG TOTAL) BY MOUTH DAILY., Starting Mon 03/05/2016, Normal     simvastatin (ZOCOR) 20 MG tablet TAKE 1 TABLET BY MOUTH DAILY, Normal    doxycycline (VIBRA-TABS) 100 MG tablet Take 1 tablet (100 mg total) by mouth 2 (two) times daily., Starting Tue 07/03/2016, Normal    pioglitazone (ACTOS) 30 MG tablet Take 1 tablet (30 mg total) by mouth daily. AM, Starting Thu 11/24/2015, Normal        Today   VITAL SIGNS:  Blood pressure (!) 120/56, pulse (!) 56, temperature 97.6 F (36.4 C), temperature source Oral, resp. rate 18, height 6\' 2"  (1.88 m), weight 90.7 kg (200 lb), SpO2 96 %.  I/O:   Intake/Output Summary (Last 24 hours) at 11/13/16 1342 Last data filed at 11/12/16 1358  Gross per 24 hour  Intake              120 ml  Output                0 ml  Net              120 ml    PHYSICAL EXAMINATION:  Physical Exam  GENERAL:  76 y.o.-year-old patient lying in the bed with no acute distress.  LUNGS: Normal breath sounds bilaterally, no wheezing, rales,rhonchi or crepitation. No use of accessory muscles of respiration.  CARDIOVASCULAR: S1, S2 normal. No murmurs, rubs, or gallops.  ABDOMEN: Soft, non-tender, non-distended. Bowel sounds present. No organomegaly or mass.  NEUROLOGIC: Moves all 4 extremities. PSYCHIATRIC: The patient is alert and oriented x 3.  SKIN: No obvious rash, lesion, or ulcer.   DATA REVIEW:   CBC  Recent Labs Lab 11/12/16 0841  WBC 9.4  HGB 14.4  HCT 43.1  PLT 158    Chemistries   Recent Labs Lab 11/11/16 1306 11/12/16 0841  NA 137 141  K 3.6 3.7  CL 108 111  CO2 26 23  GLUCOSE 134* 150*  BUN 24* 14  CREATININE 0.77 0.63  CALCIUM 8.5* 8.6*  AST 22  --   ALT 21  --   ALKPHOS 58  --   BILITOT 0.9  --     Cardiac Enzymes  Recent Labs Lab 11/12/16 0841  TROPONINI <0.03    Microbiology Results  No results found for this or any previous visit.  RADIOLOGY:  No results found.  Follow up with PCP in 1 week.  Management plans discussed with the patient, family and they are in  agreement.  CODE STATUS:  Code Status History    Date Active Date Inactive Code Status Order ID Comments User Context   11/11/2016  8:17 PM 11/12/2016  5:52 PM Full Code [id number]  Gladstone Lighter, MD Inpatient   04/30/2015  8:16 PM 05/01/2015  4:40 PM Full Code [id number]  Dustin Flock, MD Inpatient    Advance Directive Documentation     Most Recent Value  Type of Advance Directive  Healthcare Power of Attorney, Living will  Pre-existing out of facility DNR order (yellow form or pink MOST form)  -  "MOST"  Form in Place?  -      TOTAL TIME TAKING CARE OF THIS PATIENT ON DAY OF DISCHARGE: more than 30 minutes.   Hillary Bow R M.D on 11/13/2016 at 1:42 PM  Between 7am to 6pm - Pager - [phone]  After 6pm go to [email or web address] - password [email or web address] [facility]  Office  [phone]  CC: Primary care physician; [name]., MD  Note: This dictation was prepared with Dragon dictation along with smaller phrase technology. Any transcriptional errors that result from this process are unintentional.

## 2016-11-13 NOTE — Progress Notes (Signed)
Patient Care Team: [name]., MD as PCP - General (Family Medicine)   HPI  Roberto Leonard is a 76 y.o. male Seen following a hospitalization over the weekend because of syncope. He was noted at that time to have a right bundle branch block. He underwent echocardiogram 7/18 demonstrated normal LV function with mild LAE.  In-hospital telemetry demonstrated no arrhythmia  He comes in today noting that he had significant yawning prior to his episode and now recollects also being flushed.  Records and Results Reviewed As above   Past Medical History:  Diagnosis Date  . Allergy   . Detached retina    RIGHT  . Diabetes mellitus without complication (HCC)    ORAL MED  . Dupuytren's contracture   . ED (erectile dysfunction)   . GERD (gastroesophageal reflux disease)   . Glaucoma   . Hyperlipidemia   . Hypertension     Past Surgical History:  Procedure Laterality Date  . CATARACT EXTRACTION     right  . COLONOSCOPY WITH PROPOFOL N/A 01/03/2015   Procedure: COLONOSCOPY WITH PROPOFOL;  Surgeon: Lucilla Lame, MD;  Location: [facility];  Service: Endoscopy;  Laterality: N/A;  DIABETIC-ORAL MEDS  . HERNIA REPAIR    . POLYPECTOMY N/A 01/03/2015   Procedure: POLYPECTOMY;  Surgeon: Lucilla Lame, MD;  Location: [facility];  Service: Endoscopy;  Laterality: N/A;  Sigmoid polyp  . RETINAL DETACHMENT SURGERY     right eye, wrong size lense, secondary surgery to replace it    Current Outpatient Prescriptions  Medication Sig Dispense Refill  . amLODipine (NORVASC) 10 MG tablet TAKE 1 TABLET BY MOUTH DAILY EACH EVENING 90 tablet 3  . aspirin EC 325 MG tablet Take 1 tablet (325 mg total) by mouth daily. 60 tablet 0  . brimonidine (ALPHAGAN) 0.2 % ophthalmic solution Place 1 drop into both eyes 2 (two) times daily.  3  . Cetirizine HCl 10 MG CAPS Take 10 mg by mouth daily.     . Cholecalciferol (CVS VIT D 5000 HIGH-POTENCY) 5000 UNITS capsule Take 5,000  Units by mouth daily.    . dorzolamide-timolol (COSOPT) 22.3-6.8 MG/ML ophthalmic solution Place 1 drop into both eyes 2 (two) times daily.    Marland Kitchen losartan (COZAAR) 100 MG tablet TAKE 1 TABLET (100 MG TOTAL) BY MOUTH DAILY IN THE MORNING 90 tablet 3  . MULTIPLE VITAMIN PO Take 1 tablet by mouth daily.    . niacin 500 MG tablet Take 1 tablet by mouth daily.    . Omega-3 Fatty Acids (FISH OIL BURP-LESS) 1200 MG CAPS Take 2 capsules by mouth daily.    Marland Kitchen omeprazole (PRILOSEC) 20 MG capsule TAKE 1 CAPSULE (20 MG TOTAL) BY MOUTH DAILY. 90 capsule 2  . pioglitazone (ACTOS) 30 MG tablet TAKE 1 TABLET (30 MG TOTAL) BY MOUTH DAILY. AM 90 tablet 3  . simvastatin (ZOCOR) 20 MG tablet TAKE 1 TABLET BY MOUTH DAILY 90 tablet 3   No current facility-administered medications for this visit.     Allergies  Allergen Reactions  . Sulfa Antibiotics Other (See Comments)    SWEATS, bad headache      Review of Systems negative except from HPI and PMH  Physical Exam BP 110/64 (BP Location: Left Arm, Patient Position: Sitting, Cuff Size: Normal)   Pulse (!) 55   Ht 6\' 2"  (1.88 m)   Wt 203 lb 8 oz (92.3 kg)   BMI 26.13 kg/m  Well developed and  nourished in no acute distress HENT normal Neck supple with JVP-flat Carotids brisk and full without bruits Clear Regular rate and rhythm, no murmurs or gallops Abd-soft with active BS without hepatomegaly No Clubbing cyanosis edema Skin-warm and dry A & Oriented  Grossly normal sensory and motor function   ECG demonstrates sinus rhythm at 55 Intervals 16/14/46 Axis leftward at -19  Assessment and  Plan  Syncope likely neurally mediated  Right bundle branch block  Sinus bradycardia   The patient has syncope in the context of right bundle branch block raising the concern about intermittent complete heart block; his sinus bradycardia also raises the specter of sinus node dysfunction. Results from the ISSUE TRIAL suggested that about half the patients  who had syncope-unexplained with bundle branch block would have either sinus node dysfunction and/or complete heart block on loop recording.  However, interestingly, his recognition of antecedent yawning turns out to be well described aspect of a neurally mediated prodrome. He also recalls now being hot prior to syncope. This I think increases the likelihood that the episode was in fact neurally mediated not withstanding that he is 76 without a prior event.  There was no antecedent chest pain to suggest a Bezold-Jarisch reflex.   We have discussed strategies including pacing based on the PRESS TRIAL, implantable loop recorder based on the ISSUE TRIAL. Given the new information today I would favor the latter. We will proceed in that way.  In the event that he developed exertional symptoms, Myoview scanning would be appropriate.  His sinus bradycardia is quite distinctly different from his heart rates in the winter. It may well be that sinus node dysfunction is brewing in the background. Intermittent recorder will help [place] in this regard also.  No driving x 6 months --we discussed further that in the absence of a specific diagnosis which is remediable, procedural interventions would not affect this    Current medicines are reviewed at length with the patient today .  The patient does not  have concerns regarding medicines.

## 2016-11-15 ENCOUNTER — Ambulatory Visit
Admission: RE | Admit: 2016-11-15 | Discharge: 2016-11-15 | Disposition: A | Discharge status: Home / Self Care | Payer: PPO | Source: Ambulatory Visit | Attending: Internal Medicine | Admitting: Internal Medicine

## 2016-11-15 ENCOUNTER — Encounter
Admission: RE | Disposition: A | Discharge status: Home / Self Care | Payer: Self-pay | Source: Ambulatory Visit | Attending: Internal Medicine

## 2016-11-15 ENCOUNTER — Encounter: Payer: Self-pay | Admitting: Internal Medicine

## 2016-11-15 DIAGNOSIS — R55 Syncope and collapse: Secondary | ICD-10-CM | POA: Diagnosis not present

## 2016-11-15 DIAGNOSIS — E119 Type 2 diabetes mellitus without complications: Secondary | ICD-10-CM | POA: Insufficient documentation

## 2016-11-15 DIAGNOSIS — H409 Unspecified glaucoma: Secondary | ICD-10-CM | POA: Diagnosis not present

## 2016-11-15 DIAGNOSIS — I4510 Unspecified right bundle-branch block: Secondary | ICD-10-CM | POA: Insufficient documentation

## 2016-11-15 DIAGNOSIS — Z79899 Other long term (current) drug therapy: Secondary | ICD-10-CM | POA: Diagnosis not present

## 2016-11-15 DIAGNOSIS — K219 Gastro-esophageal reflux disease without esophagitis: Secondary | ICD-10-CM | POA: Diagnosis not present

## 2016-11-15 DIAGNOSIS — Z882 Allergy status to sulfonamides status: Secondary | ICD-10-CM | POA: Diagnosis not present

## 2016-11-15 DIAGNOSIS — E785 Hyperlipidemia, unspecified: Secondary | ICD-10-CM | POA: Insufficient documentation

## 2016-11-15 DIAGNOSIS — Z7982 Long term (current) use of aspirin: Secondary | ICD-10-CM | POA: Insufficient documentation

## 2016-11-15 DIAGNOSIS — I10 Essential (primary) hypertension: Secondary | ICD-10-CM | POA: Insufficient documentation

## 2016-11-15 HISTORY — PX: LOOP RECORDER INSERTION: EP1214

## 2016-11-15 SURGERY — LOOP RECORDER INSERTION
Anesthesia: Moderate Sedation

## 2016-11-15 MED ORDER — LIDOCAINE-EPINEPHRINE 2 %-1:200000 IJ SOLN
INTRAMUSCULAR | Status: AC
Start: 2016-11-15 — End: ?
  Filled 2016-11-15: qty 20

## 2016-11-15 SURGICAL SUPPLY — 2 items
LOOP REVEAL LINQSYS (Prosthesis & Implant Heart) ×3 IMPLANT
PACK LOOP INSERTION (CUSTOM PROCEDURE TRAY) ×3 IMPLANT

## 2016-11-15 NOTE — Interval H&P Note (Signed)
History and Physical Interval Note:  11/15/2016 8:41 AM  Al Corpus  has presented today for surgery, with the diagnosis of Loop Recorder Insert, Bedside@ 8:30 (ok Juliann Pulse), [employer] Rep needed Nira Conn to contact for rep   The various methods of treatment have been discussed with the patient and family. After consideration of risks, benefits and other options for treatment, the patient has consented to  Procedure(s): Loop Recorder Insertion (N/A) as a surgical intervention .  The patient's history has been reviewed, patient examined, no change in status, stable for surgery.  I have reviewed the patient's chart and labs.  Questions were answered to the patient's satisfaction.     Virl Axe  No interval syncope  Asking about cath to exclude CAD-- no symptoms but Shirl Harris is always a concern so will plan outpt GXT

## 2016-11-15 NOTE — Telephone Encounter (Signed)
Went to call patient to attempt TCM call again and noticed patient has been re-admitted to the hospital today. FYI! -MM

## 2016-11-15 NOTE — H&P (View-Only) (Signed)
Patient Care Team: [name]., MD as PCP - General (Family Medicine)   HPI  Roberto Leonard is a 76 y.o. male Seen following a hospitalization over the weekend because of syncope. He was noted at that time to have a right bundle branch block. He underwent echocardiogram 7/18 demonstrated normal LV function with mild LAE.  In-hospital telemetry demonstrated no arrhythmia  He comes in today noting that he had significant yawning prior to his episode and now recollects also being flushed.  Records and Results Reviewed As above   Past Medical History:  Diagnosis Date  . Allergy   . Detached retina    RIGHT  . Diabetes mellitus without complication (HCC)    ORAL MED  . Dupuytren's contracture   . ED (erectile dysfunction)   . GERD (gastroesophageal reflux disease)   . Glaucoma   . Hyperlipidemia   . Hypertension     Past Surgical History:  Procedure Laterality Date  . CATARACT EXTRACTION     right  . COLONOSCOPY WITH PROPOFOL N/A 01/03/2015   Procedure: COLONOSCOPY WITH PROPOFOL;  Surgeon: Lucilla Lame, MD;  Location: [place];  Service: Endoscopy;  Laterality: N/A;  DIABETIC-ORAL MEDS  . HERNIA REPAIR    . POLYPECTOMY N/A 01/03/2015   Procedure: POLYPECTOMY;  Surgeon: Lucilla Lame, MD;  Location: [place];  Service: Endoscopy;  Laterality: N/A;  Sigmoid polyp  . RETINAL DETACHMENT SURGERY     right eye, wrong size lense, secondary surgery to replace it    Current Outpatient Prescriptions  Medication Sig Dispense Refill  . amLODipine (NORVASC) 10 MG tablet TAKE 1 TABLET BY MOUTH DAILY EACH EVENING 90 tablet 3  . aspirin EC 325 MG tablet Take 1 tablet (325 mg total) by mouth daily. 60 tablet 0  . brimonidine (ALPHAGAN) 0.2 % ophthalmic solution Place 1 drop into both eyes 2 (two) times daily.  3  . Cetirizine HCl 10 MG CAPS Take 10 mg by mouth daily.     . Cholecalciferol ([employer] VIT D 5000 HIGH-POTENCY) 5000 UNITS capsule Take 5,000  Units by mouth daily.    . dorzolamide-timolol (COSOPT) 22.3-6.8 MG/ML ophthalmic solution Place 1 drop into both eyes 2 (two) times daily.    Marland Kitchen losartan (COZAAR) 100 MG tablet TAKE 1 TABLET (100 MG TOTAL) BY MOUTH DAILY IN THE MORNING 90 tablet 3  . MULTIPLE VITAMIN PO Take 1 tablet by mouth daily.    . niacin 500 MG tablet Take 1 tablet by mouth daily.    . Omega-3 Fatty Acids (FISH OIL BURP-LESS) 1200 MG CAPS Take 2 capsules by mouth daily.    Marland Kitchen omeprazole (PRILOSEC) 20 MG capsule TAKE 1 CAPSULE (20 MG TOTAL) BY MOUTH DAILY. 90 capsule 2  . pioglitazone (ACTOS) 30 MG tablet TAKE 1 TABLET (30 MG TOTAL) BY MOUTH DAILY. AM 90 tablet 3  . simvastatin (ZOCOR) 20 MG tablet TAKE 1 TABLET BY MOUTH DAILY 90 tablet 3   No current facility-administered medications for this visit.     Allergies  Allergen Reactions  . Sulfa Antibiotics Other (See Comments)    SWEATS, bad headache      Review of Systems negative except from HPI and PMH  Physical Exam BP 110/64 (BP Location: Left Arm, Patient Position: Sitting, Cuff Size: Normal)   Pulse (!) 55   Ht 6\' 2"  (1.88 m)   Wt 203 lb 8 oz (92.3 kg)   BMI 26.13 kg/m  Well developed and  nourished in no acute distress HENT normal Neck supple with JVP-flat Carotids brisk and full without bruits Clear Regular rate and rhythm, no murmurs or gallops Abd-soft with active BS without hepatomegaly No Clubbing cyanosis edema Skin-warm and dry A & Oriented  Grossly normal sensory and motor function   ECG demonstrates sinus rhythm at 55 Intervals 16/14/46 Axis leftward at -19  Assessment and  Plan  Syncope likely neurally mediated  Right bundle branch block  Sinus bradycardia   The patient has syncope in the context of right bundle branch block raising the concern about intermittent complete heart block; his sinus bradycardia also raises the specter of sinus node dysfunction. Results from the ISSUE TRIAL suggested that about half the patients  who had syncope-unexplained with bundle branch block would have either sinus node dysfunction and/or complete heart block on loop recording.  However, interestingly, his recognition of antecedent yawning turns out to be well described aspect of a neurally mediated prodrome. He also recalls now being hot prior to syncope. This I think increases the likelihood that the episode was in fact neurally mediated not withstanding that he is 76 without a prior event.  There was no antecedent chest pain to suggest a Bezold-Jarisch reflex.   We have discussed strategies including pacing based on the PRESS TRIAL, implantable loop recorder based on the ISSUE TRIAL. Given the new information today I would favor the latter. We will proceed in that way.  In the event that he developed exertional symptoms, Myoview scanning would be appropriate.  His sinus bradycardia is quite distinctly different from his heart rates in the winter. It may well be that sinus node dysfunction is brewing in the background. Intermittent recorder will help [place] in this regard also.  No driving x 6 months --we discussed further that in the absence of a specific diagnosis which is remediable, procedural interventions would not affect this    Current medicines are reviewed at length with the patient today .  The patient does not  have concerns regarding medicines.

## 2016-11-19 ENCOUNTER — Ambulatory Visit (INDEPENDENT_AMBULATORY_CARE_PROVIDER_SITE_OTHER): Payer: PPO | Admitting: Family Medicine

## 2016-11-19 VITALS — BP 112/62 | HR 56 | Resp 16 | Wt 203.0 lb

## 2016-11-19 DIAGNOSIS — E11610 Type 2 diabetes mellitus with diabetic neuropathic arthropathy: Secondary | ICD-10-CM

## 2016-11-19 DIAGNOSIS — Z23 Encounter for immunization: Secondary | ICD-10-CM

## 2016-11-19 NOTE — Addendum Note (Signed)
Addended by: Britt Bottom on: 11/19/2016 08:51 AM   Modules accepted: Orders

## 2016-11-19 NOTE — Progress Notes (Signed)
Roberto Leonard  MRN: [id number] DOB: 1940-11-22  Subjective:  HPI   Patient is a 76 year old male who presents for transition of care.  The patient was admitted to [facility] on 11/11/16 for syncope.  He was discharged on 11/12/16 after his cardiac testing appeared normal.   During his hospital follow up with Dr Caryl Comes on 11/13/16 it was discussed that he needed to have a loop recorder inserted and a stress test.   The patient was at [facility] and had loop recorder inserted on 11/15/16 and his stress test is scheduled for 11/27/16.  It is of note that the patient has had an intentional 17 pound weight loss since his visit on 06/15/16.  Patient Active Problem List   Diagnosis Date Noted  . Syncope 11/11/2016  . Memory loss 04/30/2015  . Special screening for malignant neoplasms, colon   . Benign neoplasm of sigmoid colon   . Allergic rhinitis 09/24/2014  . 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 Cheilitis 09/24/2014  . Abnormal prostate specific antigen 09/24/2014  . Erectile dysfunction associated with type 2 diabetes mellitus ([place]) 09/24/2014  . 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 Essential (primary) hypertension 09/24/2014  . Acid reflux 09/24/2014  . Glaucoma 09/24/2014  . Inguinal hernia 09/24/2014  . Hypercholesteremia 09/24/2014  . Detached retina 09/24/2014  . Type 2 diabetes mellitus with diabetic neuropathic arthropathy ([place]) 09/24/2014    Past Medical History:  Diagnosis Date  . Allergy   . Detached retina    RIGHT  . Diabetes mellitus without complication (HCC)    ORAL MED  . Dupuytren's contracture   . ED (erectile dysfunction)   . GERD (gastroesophageal reflux disease)   . Glaucoma   . Hyperlipidemia   . Hypertension     Social History   Social History  . Marital status: Married    Spouse name: Pricilla  . Number of children: 3  . Years of education: college   Occupational History  . Retired   . 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 Smoking status: Former Smoker    Packs/day: 0.50    Years: 2.00    Types: Cigarettes    Quit date: 05/14/1963  . Smokeless tobacco: Never Used  . Alcohol use No  . Drug use: No  . Sexual activity: Not on file   Other Topics Concern  . Not on file   Social History Narrative  . No narrative on file    Outpatient Encounter Prescriptions as of 11/19/2016  Medication Sig  . amLODipine (NORVASC) 10 MG tablet TAKE 1 TABLET BY MOUTH DAILY EACH EVENING  . aspirin EC 81 MG tablet Take 81 mg by mouth daily.  . brimonidine (ALPHAGAN) 0.2 % ophthalmic solution Place 1 drop into both eyes 2 (two) times daily.  . Cetirizine HCl 10 MG CAPS Take 10 mg by mouth daily.   . cholecalciferol (VITAMIN D) 1000 units tablet Take 1,000 Units by mouth daily.  . dorzolamide-timolol (COSOPT) 22.3-6.8 MG/ML ophthalmic solution Place 1 drop into both eyes 2 (two) times daily.  Marland Kitchen losartan (COZAAR) 100 MG tablet TAKE 1 TABLET (100 MG TOTAL) BY MOUTH DAILY IN THE MORNING  . Multiple Vitamin (MULTIVITAMIN WITH MINERALS) TABS tablet Take 1 tablet by mouth daily.  . Omega-3 Fatty Acids (FISH OIL BURP-LESS) 1200 MG CAPS Take 2 capsules by  mouth every evening.   Marland Kitchen omeprazole (PRILOSEC) 20 MG capsule TAKE 1 CAPSULE (20 MG TOTAL) BY MOUTH DAILY.  Marland Kitchen pioglitazone (ACTOS) 30 MG tablet TAKE 1 TABLET (30 MG TOTAL) BY MOUTH DAILY. AM  . simvastatin (ZOCOR) 20 MG tablet TAKE 1 TABLET BY MOUTH DAILY (Patient taking differently: TAKE 1 TABLET BY MOUTH EVERY EVENING.)  . [DISCONTINUED] aspirin EC 325 MG tablet Take 1 tablet (325 mg total) by mouth daily. (Patient taking differently: Take 325 mg by mouth every evening. )  . [DISCONTINUED] niacin 500 MG tablet Take 1 tablet by mouth daily.   No facility-administered encounter medications on file as of 11/19/2016.     Allergies  Allergen Reactions  . Sulfa Antibiotics Other (See  Comments)    SWEATS, bad headache    Review of Systems  Constitutional: Negative for diaphoresis, fever and malaise/fatigue.  Eyes: Negative.   Respiratory: Negative for cough, shortness of breath and wheezing.   Cardiovascular: Negative for chest pain, palpitations, orthopnea and leg swelling.  Gastrointestinal: Negative.   Genitourinary: Negative.   Musculoskeletal: Negative.   Skin: Negative.   Neurological: Negative.  Negative for weakness.  Endo/Heme/Allergies: Negative.   Psychiatric/Behavioral: Negative.     Objective:  BP 112/62 (BP Location: Right Arm, Patient Position: Sitting, Cuff Size: Normal)   Pulse (!) 56   Resp 16   Wt 203 lb (92.1 kg)   BMI 26.06 kg/m   Physical Exam  Constitutional: He is oriented to person, place, and time and well-developed, well-nourished, and in no distress.  HENT:  Head: Normocephalic and atraumatic.  Right Ear: External ear normal.  Left Ear: External ear normal.  Nose: Nose normal.  Eyes: Conjunctivae are normal. No scleral icterus.  Neck: No thyromegaly present.  Cardiovascular: Normal rate, regular rhythm and normal heart sounds.   Pulmonary/Chest: Effort normal and breath sounds normal.  Abdominal: Soft.  Neurological: He is alert and oriented to person, place, and time. No cranial nerve deficit. He exhibits normal muscle tone. Gait normal. Coordination normal. GCS score is 15.  Skin: Skin is warm.  Psychiatric: Mood, memory, affect and judgment normal.    Assessment and Plan :  Syncope Appears to be cardiac and not neurological.w/u ongoing with loop monitor . TIIDM HLD HTN  I have done the exam and reviewed the chart and it is accurate to the best of my knowledge. Development worker, community has been used and  any errors in dictation or transcription are unintentional. Miguel Aschoff M.D. [facility]

## 2016-11-19 NOTE — Telephone Encounter (Signed)
Patient in [facility] on 11/15/16 for procedure, has hospital f/u scheduled for today. ED

## 2016-11-20 ENCOUNTER — Telehealth: Payer: Self-pay | Admitting: Family Medicine

## 2016-11-20 ENCOUNTER — Other Ambulatory Visit: Payer: Self-pay

## 2016-11-20 LAB — HEMOGLOBIN A1C
Est. average glucose Bld gHb Est-mCnc: 123 mg/dL
HEMOGLOBIN A1C: 5.9 % — AB (ref 4.8–5.6)

## 2016-11-20 MED ORDER — GLUCOSE BLOOD VI STRP: ORAL_STRIP | 12 refills | Status: DC

## 2016-11-20 NOTE — Progress Notes (Signed)
touc

## 2016-11-20 NOTE — Telephone Encounter (Signed)
Pt called back saying the type that his insurance will cover is  freestyle, one touch and precision.

## 2016-11-20 NOTE — Telephone Encounter (Signed)
Meter given to patient and Rx sent to pharmacy for strips. ED

## 2016-11-22 ENCOUNTER — Telehealth: Payer: Self-pay | Admitting: Internal Medicine

## 2016-11-22 DIAGNOSIS — I2510 Atherosclerotic heart disease of native coronary artery without angina pectoris: Secondary | ICD-10-CM

## 2016-11-22 NOTE — Telephone Encounter (Signed)
S/w patient. He recalls Dr Caryl Comes mentioned he would have a stress test at the time he came in to have bandages removed post loop recorder insertion. LINQ insertion was 11/15/16. Wound check is 11/27/16 in [place]. Patient would like to know if he needs to dress in preparation for stress test that same day. Do not see where a stress test has been ordered at this time. Will route to Dr Caryl Comes and Alvis Lemmings for advice.

## 2016-11-22 NOTE — Telephone Encounter (Signed)
Pt dropped in to ask for a stress test appt that was mentioned to him on Tuesday, is wondering attire to wear and such  Please give him a call

## 2016-11-22 NOTE — Telephone Encounter (Signed)
No interval syncope  Asking about cath to exclude CAD-- no symptoms but Shirl Harris is always a concern so will plan outpt GXT     Electronically signed by Deboraha Sprang, MD at 11/15/2016 8:42 AM    Nurse unaware of the patient's need for GXT. Reviewed the above with Dr. Caryl Comes- he does want the patient to have a regular GXT done for CAD. Order placed and will send to scheduling to arrange for patient to have this done. Will see if this can be done the day of his wound check. To scheduling to arrange.

## 2016-11-22 NOTE — Telephone Encounter (Signed)
Patient called to confirm appt for gxt

## 2016-11-22 NOTE — Telephone Encounter (Signed)
S/w patient.  He verbalized understanding to:   DO NOT drink or eat foods with caffeine for 24 hours before the test. (Chocolate, coffee, tea, decaf coffee/tea, or energy drinks)  DO NOT smoke for 4 hours before your test.  If you use an inhaler, bring it with you to the test.  Wear comfortable shoes and clothing.   He is aware of appt date and time for GXT as well.

## 2016-11-24 ENCOUNTER — Other Ambulatory Visit: Payer: Self-pay | Admitting: Internal Medicine

## 2016-11-24 NOTE — Progress Notes (Signed)
Got a call from patient. He had presyncopal episode at 3:30 AM when he got up to urinate. He is wondering if this was captured on his loop recorder. I spoke with [employer] and there is no way for them to check other than sending a rep out. But they said that the ordering provider should be able to check online and see the transmissions (the transmissions are also sent to the office, which is closed today). I spoke with the day-team and Dr. Caryl Comes is working this weekend so they will discuss with him later today. Patient was updated.  Soyla Murphy, MD Cardiology

## 2016-11-26 ENCOUNTER — Telehealth: Payer: Self-pay | Admitting: Internal Medicine

## 2016-11-26 ENCOUNTER — Ambulatory Visit: Payer: PPO | Admitting: Family Medicine

## 2016-11-26 NOTE — Telephone Encounter (Signed)
Patient reports that he woke up in the middle of the night to go to the bathroom and "felt an episode coming on". States that he made it back to bed before he "passed out". He reports feeling light headed but denies any CP, ShOB or fast heart rates. He states that the episode lasted roughly 50min. And that he used his symptom activator roughly 30-min after he began to feel better. Walked patient through sending a remote transmission.

## 2016-11-26 NOTE — Telephone Encounter (Signed)
Pt said that he had an attack on Sat morning between 0-315 am and he clicked the button on loop recorder Would like information to be reviewed and called if need be Pt has appts tomorrow and would just like everyone to be aware

## 2016-11-26 NOTE — Telephone Encounter (Signed)
Transmission received, shows episode of AF beginning on 11/23/16 at 1950, duration 12hrs 72min.  Patient denies symptoms with this episode other than syncope around 0300 as previously described.  No forewarning.  Patient's wife is driving him right now due to syncope.  Advised that I will try to reach Dr. Caryl Comes today for recommendations, but will plan to see him for wound check tomorrow at 0800.  Patient is agreeable.  Able to reach Dr. Caryl Comes.  Per Dr. Caryl Comes, patient should plan to see Dr. Caryl Comes at the [facility] office tomorrow morning at 0930 to discuss episode.  Stress test rescheduled to 1030 per Dr. Olin Pia recommendation.  Patient is agreeable to these changes and denies additional questions or concerns at this time.

## 2016-11-27 ENCOUNTER — Ambulatory Visit: Payer: PPO

## 2016-11-27 ENCOUNTER — Encounter: Payer: Self-pay | Admitting: Internal Medicine

## 2016-11-27 ENCOUNTER — Ambulatory Visit (INDEPENDENT_AMBULATORY_CARE_PROVIDER_SITE_OTHER): Payer: PPO | Admitting: Internal Medicine

## 2016-11-27 VITALS — BP 119/68 | HR 62 | Ht 73.0 in | Wt 203.0 lb

## 2016-11-27 DIAGNOSIS — R55 Syncope and collapse: Secondary | ICD-10-CM

## 2016-11-27 LAB — CUP PACEART INCLINIC DEVICE CHECK
Date Time Interrogation Session: 20180717142854
Implantable Pulse Generator Implant Date: 20180705

## 2016-11-27 MED ORDER — APIXABAN 5 MG PO TABS: 5.0000 mg | ORAL_TABLET | Freq: Two times a day (BID) | ORAL | 0 refills | Status: DC

## 2016-11-27 MED ORDER — APIXABAN 5 MG PO TABS: 5.0000 mg | ORAL_TABLET | Freq: Two times a day (BID) | ORAL | 6 refills | Status: DC

## 2016-11-27 NOTE — Patient Instructions (Signed)
Medication Instructions: - Your physician has recommended you make the following change in your medication: 1) Stop aspirin 2) Start Eliquis 5 mg- take 1 tablet by mouth twice daily  Labwork: - none ordered  Procedures/Testing: - none ordered  Follow-Up: - Your physician recommends that you schedule a follow-up appointment in: 3 months with Dr. Caryl Comes.   Any Additional Special Instructions Will Be Listed Below (If Applicable).     If you need a refill on your cardiac medications before your next appointment, please call your pharmacy.

## 2016-11-27 NOTE — Progress Notes (Signed)
Patient Care Team: [name]., MD as PCP - General (Family Medicine)   HPI  Roberto Leonard is a 76 y.o. male Seen following a hospitalization over the weekend because of syncope. He was noted at that time to have a right bundle branch block. He underwent echocardiogram 7/18 demonstrated normal LV function with mild LAE.  In-hospital telemetry demonstrated no arrhythmia  After his last visit and the recognition of a yellow-green as part of the prodrome which turned out to be well described in neurally mediated events we elected to proceed with implantable loop recorder as opposed to pacing.  He has had interval syncope. He developed atrial fibrillation a few nights ago this lasted for 12 hours and was unassociated with palpitations shortness of breath or any exercise intolerance or dizziness.  He did however have syncope. He urinated night to the kitchen to get something to drink. Headache prodrome lightheadedness, vomiting, diaphoresis. He returned to the bedroom and became presyncopal. He is wife described him as extremely pale. She raises feet. And gradually his symptoms subsided.   Records and Results Reviewed As above  Thromboembolic risk factors ( age  -2, HTN-1, DM-1 ) for a CHADSVASc Score of 4 Past Medical History:  Diagnosis Date  . Allergy   . Detached retina    RIGHT  . Diabetes mellitus without complication (HCC)    ORAL MED  . Dupuytren's contracture   . ED (erectile dysfunction)   . GERD (gastroesophageal reflux disease)   . Glaucoma   . Hyperlipidemia   . Hypertension     Past Surgical History:  Procedure Laterality Date  . CATARACT EXTRACTION     right  . COLONOSCOPY WITH PROPOFOL N/A 01/03/2015   Procedure: COLONOSCOPY WITH PROPOFOL;  Surgeon: Lucilla Lame, MD;  Location: [place];  Service: Endoscopy;  Laterality: N/A;  DIABETIC-ORAL MEDS  . HERNIA REPAIR    . LOOP RECORDER INSERTION N/A 11/15/2016   Procedure: Loop Recorder  Insertion;  Surgeon: Deboraha Sprang, MD;  Location: [place] [facility];  Service: Cardiovascular;  Laterality: N/A;  . POLYPECTOMY N/A 01/03/2015   Procedure: POLYPECTOMY;  Surgeon: Lucilla Lame, MD;  Location: [place];  Service: Endoscopy;  Laterality: N/A;  Sigmoid polyp  . RETINAL DETACHMENT SURGERY     right eye, wrong size lense, secondary surgery to replace it    Current Outpatient Prescriptions  Medication Sig Dispense Refill  . amLODipine (NORVASC) 10 MG tablet TAKE 1 TABLET BY MOUTH DAILY EACH EVENING 90 tablet 3  . aspirin EC 81 MG tablet Take 81 mg by mouth daily.    . brimonidine (ALPHAGAN) 0.2 % ophthalmic solution Place 1 drop into both eyes 2 (two) times daily.  3  . Cetirizine HCl 10 MG CAPS Take 10 mg by mouth daily.     . cholecalciferol (VITAMIN D) 1000 units tablet Take 1,000 Units by mouth daily.    . dorzolamide-timolol (COSOPT) 22.3-6.8 MG/ML ophthalmic solution Place 1 drop into both eyes 2 (two) times daily.    Marland Kitchen glucose blood (ONETOUCH VERIO) test strip Test glucose daily and as needed 100 each 12  . losartan (COZAAR) 100 MG tablet TAKE 1 TABLET (100 MG TOTAL) BY MOUTH DAILY IN THE MORNING 90 tablet 3  . Multiple Vitamin (MULTIVITAMIN WITH MINERALS) TABS tablet Take 1 tablet by mouth daily.    . Omega-3 Fatty Acids (FISH OIL BURP-LESS) 1200 MG CAPS Take 2 capsules by mouth every evening.     Marland Kitchen  omeprazole (PRILOSEC) 20 MG capsule TAKE 1 CAPSULE (20 MG TOTAL) BY MOUTH DAILY. 90 capsule 2  . pioglitazone (ACTOS) 30 MG tablet TAKE 1 TABLET (30 MG TOTAL) BY MOUTH DAILY. AM 90 tablet 3  . simvastatin (ZOCOR) 20 MG tablet TAKE 1 TABLET BY MOUTH DAILY (Patient taking differently: TAKE 1 TABLET BY MOUTH EVERY EVENING.) 90 tablet 3   No current facility-administered medications for this visit.     Allergies  Allergen Reactions  . Sulfa Antibiotics Other (See Comments)    SWEATS, bad headache      Review of Systems negative except from HPI and  PMH  Physical Exam BP 119/68 (BP Location: Right Arm, Patient Position: Sitting, Cuff Size: Normal)   Pulse 62   Ht 6\' 1"  (1.854 m)   Wt 203 lb (92.1 kg)   BMI 26.78 kg/m  Well developed and nourished in no acute distress HENT normal Neck supple with JVP-flat Carotids brisk and full without bruits Clear Wound well healed Regular rate and rhythm, no murmurs or gallops Abd-soft with active BS without hepatomegaly No Clubbing cyanosis edema Skin-warm and dry A & Oriented  Grossly normal sensory and motor function   ECG demonstrates sinus rhythm at 55 Intervals 17/13/44 Axis leftward at -10 PACs  Event recorder was reviewed. 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 We reviewed that current guidelines do not inform recommendations regarding anticoagulation.  There are no clear duration thresholds that would prompt the initiation of anticoagulation although data do demonstrate increased mortality with as little as 5 minutes, but a more risk of thromboembolism with prolonged duration and, based on ASSERT 2, a threshold of 24 hours seems to be associated with a significant increase in risk. Other significant a threshold 12 hours. With his CHADS-VASc score 4 we have elected to switch his aspirin which had no benefit to [place]. The use of anticoagulation is a choice made in conversation with the patient and his wife   We reviewed driving restrictions, at least now for total 6 months   This interval episode of syncope was unassociated with bradycardia arrhythmia. Occurred in the context of atrial fibrillation which may have predisposed by changing ventricular  filling pattern. It had a recognizable prodrome. At this juncture, he is advised to remain well-hydrated and to be cognizant of the prodrome. He is to lie down when he feels the prodrome noted the other day or the only noted at church.  We will continue with the loop recorder given his right bundle branch block  More than 50% of 45 min was spent in counseling related to the above   Current medicines are reviewed at length with the patient today .  The patient does not  have concerns regarding medicines.

## 2016-11-29 ENCOUNTER — Other Ambulatory Visit: Payer: Self-pay | Admitting: Family Medicine

## 2016-11-30 NOTE — Addendum Note (Signed)
Addended by: Britt Bottom on: 11/30/2016 08:37 AM   Modules accepted: Orders

## 2016-12-04 ENCOUNTER — Telehealth: Payer: Self-pay | Admitting: Internal Medicine

## 2016-12-04 ENCOUNTER — Other Ambulatory Visit: Payer: Self-pay | Admitting: *Deleted

## 2016-12-04 ENCOUNTER — Other Ambulatory Visit: Payer: Self-pay | Admitting: Internal Medicine

## 2016-12-04 MED ORDER — APIXABAN 5 MG PO TABS: 5.0000 mg | ORAL_TABLET | Freq: Two times a day (BID) | ORAL | 3 refills | Status: DC

## 2016-12-04 NOTE — Telephone Encounter (Signed)
°*  STAT* If patient is at the pharmacy, call can be transferred to refill team.   1. Which medications need to be refilled? (please list name of each medication and dose if known) apixaban (ELIQUIS) twice a day 5 mg  2. Which pharmacy/location (including street and city if local pharmacy) is medication to be sent to? [facility] in [place]   3. Do they need a 30 day or 90 day supply? [place]

## 2016-12-04 NOTE — Telephone Encounter (Signed)
Patient has had recent syncope. Only restriction is driving per Dr. 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  I have notified the patient of this and he voices understanding.

## 2016-12-04 NOTE — Telephone Encounter (Signed)
Eliquis 5 mg tablet sent to local pharmacy. #180 tablets #3 refills.

## 2016-12-04 NOTE — Telephone Encounter (Signed)
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

## 2016-12-17 ENCOUNTER — Telehealth: Payer: Self-pay | Admitting: Internal Medicine

## 2016-12-17 ENCOUNTER — Ambulatory Visit (INDEPENDENT_AMBULATORY_CARE_PROVIDER_SITE_OTHER): Payer: PPO | Admitting: *Deleted

## 2016-12-17 DIAGNOSIS — R55 Syncope and collapse: Secondary | ICD-10-CM

## 2016-12-17 NOTE — Telephone Encounter (Signed)
S/w patient. He would like to know if he's had another episode of A fib.  Advised patient that the last transmission received showed episode of A. Fib beginning on 11/23/16 at 1950 and lasted 12 hrs 4 min. He says he did not even feel the episode. He was very appreciative and would like a call if we receive any other downloads showing the A fib. Advised I would send message to [facility] to let them know; otherwise, he could call back later this month to see what the download shows. He was very Patent attorney.

## 2016-12-17 NOTE — Telephone Encounter (Signed)
Patient came to office for copy of medical records.  Patient wants to clarify from notes that his episode of afib on 11/15/16 was the only episode.  Please call to confirm.

## 2016-12-18 NOTE — Progress Notes (Signed)
Carelink Summary Report / Loop Recorder 

## 2016-12-20 ENCOUNTER — Telehealth: Payer: Self-pay

## 2016-12-20 DIAGNOSIS — I4510 Unspecified right bundle-branch block: Secondary | ICD-10-CM

## 2016-12-20 NOTE — Telephone Encounter (Signed)
Patient dropped of a note stating that he would like to be referred to Dr Lorinda Creed for second opinion concerning his right bundle branch block-wants to know if he has any options for this. He states he has copy of medical records to show Dr Lorinda Creed. Please review.

## 2016-12-24 NOTE — Telephone Encounter (Signed)
ok 

## 2016-12-24 NOTE — Telephone Encounter (Signed)
Order placed for referral for the patient-aa

## 2016-12-26 DIAGNOSIS — I4510 Unspecified right bundle-branch block: Secondary | ICD-10-CM | POA: Insufficient documentation

## 2016-12-26 DIAGNOSIS — R001 Bradycardia, unspecified: Secondary | ICD-10-CM | POA: Diagnosis not present

## 2016-12-26 DIAGNOSIS — I480 Paroxysmal atrial fibrillation: Secondary | ICD-10-CM | POA: Diagnosis not present

## 2016-12-26 DIAGNOSIS — R55 Syncope and collapse: Secondary | ICD-10-CM | POA: Diagnosis not present

## 2016-12-26 LAB — CUP PACEART REMOTE DEVICE CHECK
MDC IDC PG IMPLANT DT: 20180705
MDC IDC SESS DTM: 20180804160954

## 2017-01-03 DIAGNOSIS — R001 Bradycardia, unspecified: Secondary | ICD-10-CM | POA: Diagnosis not present

## 2017-01-09 DIAGNOSIS — R55 Syncope and collapse: Secondary | ICD-10-CM | POA: Diagnosis not present

## 2017-01-09 DIAGNOSIS — R001 Bradycardia, unspecified: Secondary | ICD-10-CM | POA: Diagnosis not present

## 2017-01-09 DIAGNOSIS — I480 Paroxysmal atrial fibrillation: Secondary | ICD-10-CM | POA: Diagnosis not present

## 2017-01-09 DIAGNOSIS — I4510 Unspecified right bundle-branch block: Secondary | ICD-10-CM | POA: Diagnosis not present

## 2017-01-15 ENCOUNTER — Ambulatory Visit (INDEPENDENT_AMBULATORY_CARE_PROVIDER_SITE_OTHER): Payer: PPO | Admitting: *Deleted

## 2017-01-15 ENCOUNTER — Telehealth: Payer: Self-pay | Admitting: Internal Medicine

## 2017-01-15 DIAGNOSIS — R55 Syncope and collapse: Secondary | ICD-10-CM | POA: Diagnosis not present

## 2017-01-15 NOTE — Telephone Encounter (Signed)
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 Patient with call tomorrow for assistance when he is at home tomorrow afternoon.

## 2017-01-15 NOTE — Telephone Encounter (Signed)
Roberto Leonard is calling to find out if his transmission was received and what was the outcome . Please call . Thanks

## 2017-01-16 ENCOUNTER — Telehealth: Payer: Self-pay | Admitting: Cardiology

## 2017-01-16 NOTE — Telephone Encounter (Signed)
LMOVM requesting that pt send manual transmission b/c home monitor has not updated in at least 14 days.    

## 2017-01-17 NOTE — Progress Notes (Signed)
Carelink Summary Report / Loop Recorder 

## 2017-01-20 LAB — CUP PACEART REMOTE DEVICE CHECK
Date Time Interrogation Session: 20180903163842
MDC IDC PG IMPLANT DT: 20180705

## 2017-01-22 ENCOUNTER — Telehealth: Payer: Self-pay | Admitting: Internal Medicine

## 2017-01-22 NOTE — Telephone Encounter (Signed)
LMTCB//sss 

## 2017-01-22 NOTE — Telephone Encounter (Signed)
Patient returned my call. Patient asked if he had any episodes on his most recent transmissions. I informed patient that he has not had an episode since 7/13. Patient verbalized understanding and appreciation of information.  Patient also wanted to know what was done with his monitor once his ILR was explanted. I informed him that he would be sent a return kit so that he could send the montitor back to the company. I offered to give patient the 800# so that he could speak to the company about what is done with the monitor after it's returned. Patient declined the phone number at this time.

## 2017-01-22 NOTE — Telephone Encounter (Signed)
Patient wants recent report download from loop recorder.  These have not been resulted to patient please call if he can come to office and pick up   He also wants to know what happens to the actual loop device once removed.  Is this recycled.  Please call to discuss.

## 2017-02-13 ENCOUNTER — Ambulatory Visit (INDEPENDENT_AMBULATORY_CARE_PROVIDER_SITE_OTHER): Payer: PPO | Admitting: *Deleted

## 2017-02-13 DIAGNOSIS — R55 Syncope and collapse: Secondary | ICD-10-CM | POA: Diagnosis not present

## 2017-02-13 NOTE — Progress Notes (Signed)
Carelink Summary Report / Loop Recorder 

## 2017-02-15 LAB — CUP PACEART REMOTE DEVICE CHECK
Implantable Pulse Generator Implant Date: 20180705
MDC IDC SESS DTM: 20181003163935

## 2017-02-19 DIAGNOSIS — I4510 Unspecified right bundle-branch block: Secondary | ICD-10-CM | POA: Diagnosis not present

## 2017-02-19 DIAGNOSIS — I480 Paroxysmal atrial fibrillation: Secondary | ICD-10-CM | POA: Diagnosis not present

## 2017-02-19 DIAGNOSIS — R55 Syncope and collapse: Secondary | ICD-10-CM | POA: Diagnosis not present

## 2017-02-19 DIAGNOSIS — R001 Bradycardia, unspecified: Secondary | ICD-10-CM | POA: Diagnosis not present

## 2017-02-27 ENCOUNTER — Telehealth: Payer: Self-pay | Admitting: Cardiology

## 2017-02-27 NOTE — Telephone Encounter (Signed)
LMOVM requesting that pt send manual transmission b/c home monitor has not updated in at least 14 days.    

## 2017-03-15 ENCOUNTER — Ambulatory Visit (INDEPENDENT_AMBULATORY_CARE_PROVIDER_SITE_OTHER): Payer: PPO | Admitting: *Deleted

## 2017-03-15 DIAGNOSIS — R55 Syncope and collapse: Secondary | ICD-10-CM | POA: Diagnosis not present

## 2017-03-15 NOTE — Progress Notes (Signed)
Carelink Summary Report / Loop Recorder 

## 2017-03-18 LAB — CUP PACEART REMOTE DEVICE CHECK
Date Time Interrogation Session: 20181102173839
MDC IDC PG IMPLANT DT: 20180705

## 2017-03-26 ENCOUNTER — Encounter: Payer: Self-pay | Admitting: Internal Medicine

## 2017-03-26 ENCOUNTER — Ambulatory Visit (INDEPENDENT_AMBULATORY_CARE_PROVIDER_SITE_OTHER): Payer: PPO | Admitting: Internal Medicine

## 2017-03-26 VITALS — BP 128/78 | HR 57 | Ht 74.0 in | Wt 213.2 lb

## 2017-03-26 DIAGNOSIS — R55 Syncope and collapse: Secondary | ICD-10-CM | POA: Diagnosis not present

## 2017-03-26 DIAGNOSIS — I10 Essential (primary) hypertension: Secondary | ICD-10-CM | POA: Diagnosis not present

## 2017-03-26 DIAGNOSIS — I480 Paroxysmal atrial fibrillation: Secondary | ICD-10-CM | POA: Diagnosis not present

## 2017-03-26 DIAGNOSIS — Z959 Presence of cardiac and vascular implant and graft, unspecified: Secondary | ICD-10-CM | POA: Diagnosis not present

## 2017-03-26 LAB — CUP PACEART INCLINIC DEVICE CHECK
Date Time Interrogation Session: 20181113132452
Implantable Pulse Generator Implant Date: 20180705

## 2017-03-26 NOTE — Patient Instructions (Signed)
Medication Instructions:  Your physician recommends that you continue on your current medications as directed. Please refer to the Current Medication list given to you today.   Labwork: none  Testing/Procedures: none  Follow-Up: Your physician wants you to follow-up in: 1 year with Dr. Caryl Comes.  You will receive a reminder letter in the mail two months in advance. If you don't receive a letter, please call our office to schedule the follow-up appointment.   Any Other Special Instructions Will Be Listed Below (If Applicable).     If you need a refill on your cardiac medications before your next appointment, please call your pharmacy.

## 2017-03-26 NOTE — Progress Notes (Signed)
Patient Care Team: [name]., MD as PCP - General (Family Medicine)   HPI  Roberto Leonard is a 76 y.o. male Seen following a hospitalization over the weekend because of syncope. He was noted at that time to have a right bundle branch block. He underwent echocardiogram 7/18 demonstrated normal LV function with mild LAE.  In-hospital telemetry demonstrated no arrhythmia  After his last visit and the recognition of a yellow-green as part of the prodrome which turned out to be well described in neurally mediated events we elected to proceed with implantable loop recorder as opposed to pacing.  He had interval syncope in the context of atrial fibrillation but without specific arrhythmia  He was started on apixoban   No bleeding  Two interval episodes of presyncope, both assoc with HR 50-80 on his apple watch  Notes his HR can go 60-120 rather aburptly, or he is aware of it suddenly, as he has no palpitations  With exertion HR>>150    Records and Results Reviewed As above  Thromboembolic risk factors ( age  -2, HTN-1, DM-1 ) for a CHADSVASc Score of 4 Past Medical History:  Diagnosis Date  . Allergy   . Detached retina    RIGHT  . Diabetes mellitus without complication (HCC)    ORAL MED  . Dupuytren's contracture   . ED (erectile dysfunction)   . GERD (gastroesophageal reflux disease)   . Glaucoma   . Hyperlipidemia   . Hypertension     Past Surgical History:  Procedure Laterality Date  . CATARACT EXTRACTION     right  . HERNIA REPAIR    . RETINAL DETACHMENT SURGERY     right eye, wrong size lense, secondary surgery to replace it    Current Outpatient Medications  Medication Sig Dispense Refill  . amLODipine (NORVASC) 10 MG tablet TAKE 1 TABLET BY MOUTH DAILY EACH EVENING 90 tablet 3  . apixaban (ELIQUIS) 5 MG TABS tablet Take 1 tablet (5 mg total) by mouth 2 (two) times daily. 180 tablet 3  . brimonidine (ALPHAGAN) 0.2 % ophthalmic solution  Place 1 drop into both eyes 2 (two) times daily.  3  . Cetirizine HCl 10 MG CAPS Take 10 mg by mouth daily.     . cholecalciferol (VITAMIN D) 1000 units tablet Take 1,000 Units by mouth daily.    . dorzolamide-timolol (COSOPT) 22.3-6.8 MG/ML ophthalmic solution Place 1 drop into both eyes 2 (two) times daily.    Marland Kitchen glucose blood (ONETOUCH VERIO) test strip Test glucose daily and as needed 100 each 12  . losartan (COZAAR) 100 MG tablet TAKE 1 TABLET (100 MG TOTAL) BY MOUTH DAILY IN THE MORNING 90 tablet 3  . Multiple Vitamin (MULTIVITAMIN WITH MINERALS) TABS tablet Take 1 tablet by mouth daily.    . Omega-3 Fatty Acids (FISH OIL BURP-LESS) 1200 MG CAPS Take 2 capsules by mouth every evening.     Marland Kitchen omeprazole (PRILOSEC) 20 MG capsule TAKE 1 CAPSULE (20 MG TOTAL) BY MOUTH DAILY. 90 capsule 2  . pioglitazone (ACTOS) 30 MG tablet TAKE 1 TABLET (30 MG TOTAL) BY MOUTH DAILY. AM 90 tablet 3  . simvastatin (ZOCOR) 20 MG tablet TAKE 1 TABLET BY MOUTH DAILY (Patient taking differently: TAKE 1 TABLET BY MOUTH EVERY EVENING.) 90 tablet 3   No current facility-administered medications for this visit.     Allergies  Allergen Reactions  . Sulfa Antibiotics Other (See Comments)    SWEATS, bad  headache SWEATS, bad headache      Review of Systems negative except from HPI and PMH  Physical Exam BP 128/78 (BP Location: Left Arm, Patient Position: Sitting, Cuff Size: Normal)   Pulse (!) 57   Ht 6\' 2"  (1.88 m)   Wt 213 lb 4 oz (96.7 kg)   BMI 27.38 kg/m  Well developed and nourished in no acute distress HENT normal Neck supple with JVP-flat Clear Regular rate and rhythm, no murmurs or gallops Abd-soft with active BS No Clubbing cyanosis edema Skin-warm and dry A & Oriented  Grossly normal sensory and motor function   ECG demonstrates sinus rhythm at 57 18/14/43 RBBB Axis leftward at-38 PACs    Assessment and  Plan  Syncope    Right bundle branch block  Sinus  bradycardia  Implantable Loop recorder   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  The patient does not  have concerns regarding medicines.

## 2017-03-27 ENCOUNTER — Other Ambulatory Visit: Payer: Self-pay

## 2017-03-27 ENCOUNTER — Ambulatory Visit: Payer: PPO | Admitting: Family Medicine

## 2017-03-27 VITALS — BP 120/64 | HR 56 | Temp 97.8°F | Resp 16 | Wt 218.0 lb

## 2017-03-27 DIAGNOSIS — E11610 Type 2 diabetes mellitus with diabetic neuropathic arthropathy: Secondary | ICD-10-CM

## 2017-03-27 DIAGNOSIS — I10 Essential (primary) hypertension: Secondary | ICD-10-CM

## 2017-03-27 DIAGNOSIS — E785 Hyperlipidemia, unspecified: Secondary | ICD-10-CM

## 2017-03-27 DIAGNOSIS — E1169 Type 2 diabetes mellitus with other specified complication: Secondary | ICD-10-CM

## 2017-03-27 DIAGNOSIS — R972 Elevated prostate specific antigen [PSA]: Secondary | ICD-10-CM

## 2017-03-27 NOTE — Progress Notes (Signed)
Roberto Leonard  MRN: [id number] DOB: [birth year]  Subjective:  HPI   The patient is a 76 year old male who presents for follow up of his chronic issues.  He was last seen on 11/19/16.    Hypertension-The patient is here for follow up of his hypertension.  He was seen by Cardiology yesterday and had changes in his medicine.  He is no longer on Amlodipine and was given instead Diltiazem 180 mg daily.  Along with that change they also changed his Simvastatin from 20 mg daily to 10 mg daily. He reports good compliance and tolerance.    His A1C at that time was 5.9. He does not check his glucose at home.  He is compliant and tolerating his medications.    He has not had his lipids checked 11/2015.  He also thinks it is time for him to have his PSA checked today.  He does not have a cancer diagnosis but has had elevated PSA on more than 1 occasion.    Patient Active Problem List   Diagnosis Date Noted  . Syncope 11/11/2016  . Memory loss 04/30/2015  . Special screening for malignant neoplasms, colon   . Benign neoplasm of sigmoid colon   . Allergic rhinitis 09/24/2014  . 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 Cheilitis 09/24/2014  . Abnormal prostate specific antigen 09/24/2014  . Erectile dysfunction associated with type 2 diabetes mellitus ([place]) 09/24/2014  . 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 Essential (primary) hypertension 09/24/2014  . Acid reflux 09/24/2014  . Glaucoma 09/24/2014  . Inguinal hernia 09/24/2014  . Hypercholesteremia 09/24/2014  . Detached retina 09/24/2014  . Type 2 diabetes mellitus with diabetic neuropathic arthropathy ([place]) 09/24/2014    Past Medical History:  Diagnosis Date  . Allergy   . Detached retina    RIGHT  . Diabetes mellitus without complication (HCC)    ORAL MED  . Dupuytren's contracture   . ED (erectile dysfunction)   . GERD (gastroesophageal reflux disease)   . Glaucoma   .  Hyperlipidemia   . Hypertension     Social History   Socioeconomic History  . Marital status: Married    Spouse name: Pricilla  . Number of children: 3  . Years of education: college  . Highest education level: Not on file  Social Needs  . Financial resource strain: Not on file  . Food insecurity - worry: Not on file  . Food insecurity - inability: Not on file  . Transportation needs - medical: Not on file  . Transportation needs - non-medical: Not on file  Occupational History  . Occupation: Retired  . 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 Smoking status: Former Smoker    Packs/day: 0.50    Years: 2.00    Pack years: 1.00    Types: Cigarettes    Last attempt to quit: 05/14/1963    Years since quitting: 53.9  . Smokeless tobacco: Never Used  Substance and Sexual Activity  . Alcohol use: No    Alcohol/week: 0.0 oz  . Drug use: No  . Sexual activity: Not on file  Other Topics Concern  . Not on file  Social History Narrative  . Not on file    Outpatient Encounter Medications as of 03/27/2017  Medication Sig  . apixaban (ELIQUIS) 5 MG TABS tablet Take 1  tablet (5 mg total) by mouth 2 (two) times daily.  . brimonidine (ALPHAGAN) 0.2 % ophthalmic solution Place 1 drop into both eyes 2 (two) times daily.  . Cetirizine HCl 10 MG CAPS Take 10 mg by mouth daily.   . cholecalciferol (VITAMIN D) 1000 units tablet Take 1,000 Units by mouth daily.  Marland Kitchen diltiazem (TIAZAC) 180 MG 24 hr capsule Take 180 mg daily by mouth.  . dorzolamide-timolol (COSOPT) 22.3-6.8 MG/ML ophthalmic solution Place 1 drop into both eyes 2 (two) times daily.  Marland Kitchen glucose blood (ONETOUCH VERIO) test strip Test glucose daily and as needed  . losartan (COZAAR) 100 MG tablet TAKE 1 TABLET (100 MG TOTAL) BY MOUTH DAILY IN THE MORNING  . Multiple Vitamin (MULTIVITAMIN WITH MINERALS) TABS tablet Take 1 tablet by mouth daily.  .  Omega-3 Fatty Acids (FISH OIL BURP-LESS) 1200 MG CAPS Take 2 capsules by mouth every evening.   Marland Kitchen omeprazole (PRILOSEC) 20 MG capsule TAKE 1 CAPSULE (20 MG TOTAL) BY MOUTH DAILY.  Marland Kitchen pioglitazone (ACTOS) 30 MG tablet TAKE 1 TABLET (30 MG TOTAL) BY MOUTH DAILY. AM  . simvastatin (ZOCOR) 10 MG tablet Take 10 mg daily by mouth.  . [DISCONTINUED] amLODipine (NORVASC) 10 MG tablet TAKE 1 TABLET BY MOUTH DAILY EACH EVENING  . [DISCONTINUED] simvastatin (ZOCOR) 20 MG tablet TAKE 1 TABLET BY MOUTH DAILY (Patient taking differently: TAKE 1 TABLET BY MOUTH EVERY EVENING.)   No facility-administered encounter medications on file as of 03/27/2017.     Allergies  Allergen Reactions  . Sulfa Antibiotics Other (See Comments)    SWEATS, bad headache SWEATS, bad headache    Review of Systems  Constitutional: Negative for fever and malaise/fatigue.  Respiratory: Negative for cough, shortness of breath and wheezing.   Cardiovascular: Negative for chest pain, palpitations, orthopnea, claudication and leg swelling.  Genitourinary: Negative for frequency (nocturia x 2-3 times).  Neurological: Negative.  Negative for weakness.  Endo/Heme/Allergies: Negative for polydipsia.  Psychiatric/Behavioral: Negative.     Objective:  BP 120/64 (BP Location: Right Arm, Patient Position: Sitting, Cuff Size: Normal)   Pulse (!) 56   Temp 97.8 F (36.6 C) (Oral)   Resp 16   Wt 218 lb (98.9 kg)   BMI 27.99 kg/m   Physical Exam  Constitutional: He is oriented to person, place, and time and well-developed, well-nourished, and in no distress.  HENT:  Head: Normocephalic.  Eyes: Pupils are equal, round, and reactive to light. No scleral icterus.  Neck: Normal range of motion. No thyromegaly present.  Cardiovascular: Normal rate, regular rhythm and normal heart sounds.  Pulmonary/Chest: Effort normal and breath sounds normal.  Abdominal: Soft.  Neurological: He is alert and oriented to person, place, and time.    Skin: Skin is warm.  Psychiatric: Mood, memory, affect and judgment normal.    Assessment and Plan :  HTN HLD Prediabetes Elevated PSA Check PSA/lipids  I have done the exam and reviewed the chart and it is accurate to the best of my knowledge. Development worker, community has been used and  any errors in dictation or transcription are unintentional. Miguel Aschoff M.D. [facility]

## 2017-03-29 DIAGNOSIS — R972 Elevated prostate specific antigen [PSA]: Secondary | ICD-10-CM | POA: Diagnosis not present

## 2017-03-29 DIAGNOSIS — E11610 Type 2 diabetes mellitus with diabetic neuropathic arthropathy: Secondary | ICD-10-CM | POA: Diagnosis not present

## 2017-03-29 DIAGNOSIS — E785 Hyperlipidemia, unspecified: Secondary | ICD-10-CM | POA: Diagnosis not present

## 2017-03-29 DIAGNOSIS — E1169 Type 2 diabetes mellitus with other specified complication: Secondary | ICD-10-CM | POA: Diagnosis not present

## 2017-03-30 LAB — HEMOGLOBIN A1C
HEMOGLOBIN A1C: 5.8 %{Hb} — AB (ref <5.7)
Mean Plasma Glucose: 120 (calc)
eAG (mmol/L): 6.6 (calc)

## 2017-03-30 LAB — LIPID PANEL
Cholesterol: 157 mg/dL (ref <200)
HDL: 45 mg/dL (ref >40)
LDL CHOLESTEROL (CALC): 91 mg/dL
Non-HDL Cholesterol (Calc): 112 mg/dL (calc) (ref <130)
TRIGLYCERIDES: 109 mg/dL (ref <150)
Total CHOL/HDL Ratio: 3.5 (calc) (ref <5.0)

## 2017-03-30 LAB — PSA: PSA: 3.9 ng/mL (ref <=4.0)

## 2017-04-01 DIAGNOSIS — H40003 Preglaucoma, unspecified, bilateral: Secondary | ICD-10-CM | POA: Diagnosis not present

## 2017-04-03 ENCOUNTER — Other Ambulatory Visit: Payer: Self-pay | Admitting: Family Medicine

## 2017-04-08 DIAGNOSIS — H40003 Preglaucoma, unspecified, bilateral: Secondary | ICD-10-CM | POA: Diagnosis not present

## 2017-04-08 LAB — HM DIABETES EYE EXAM

## 2017-04-15 ENCOUNTER — Ambulatory Visit (INDEPENDENT_AMBULATORY_CARE_PROVIDER_SITE_OTHER): Payer: PPO | Admitting: *Deleted

## 2017-04-15 ENCOUNTER — Telehealth: Payer: Self-pay

## 2017-04-15 DIAGNOSIS — R55 Syncope and collapse: Secondary | ICD-10-CM

## 2017-04-15 NOTE — Telephone Encounter (Signed)
LMTCB and schedule AWV and CPE for 05/217.

## 2017-04-15 NOTE — Progress Notes (Signed)
Carelink Summary Report / Loop Recorder 

## 2017-04-23 LAB — CUP PACEART REMOTE DEVICE CHECK
Date Time Interrogation Session: 20181202184016
Implantable Pulse Generator Implant Date: 20180705

## 2017-04-25 ENCOUNTER — Other Ambulatory Visit: Payer: Self-pay | Admitting: Family Medicine

## 2017-04-25 NOTE — Telephone Encounter (Signed)
Pharmacy requesting refills. Thanks!  

## 2017-05-15 ENCOUNTER — Ambulatory Visit (INDEPENDENT_AMBULATORY_CARE_PROVIDER_SITE_OTHER): Payer: PPO | Admitting: *Deleted

## 2017-05-15 DIAGNOSIS — R55 Syncope and collapse: Secondary | ICD-10-CM

## 2017-05-16 ENCOUNTER — Telehealth: Payer: Self-pay | Admitting: Family Medicine

## 2017-05-16 NOTE — Progress Notes (Signed)
Carelink Summary Report / Loop Recorder 

## 2017-05-28 LAB — CUP PACEART REMOTE DEVICE CHECK
Date Time Interrogation Session: 20190101190919
MDC IDC PG IMPLANT DT: 20180705

## 2017-06-13 ENCOUNTER — Ambulatory Visit (INDEPENDENT_AMBULATORY_CARE_PROVIDER_SITE_OTHER): Payer: PPO | Admitting: *Deleted

## 2017-06-13 DIAGNOSIS — R55 Syncope and collapse: Secondary | ICD-10-CM

## 2017-06-14 NOTE — Progress Notes (Signed)
Carelink Summary Report / Loop Recorder 

## 2017-06-19 DIAGNOSIS — I4510 Unspecified right bundle-branch block: Secondary | ICD-10-CM | POA: Diagnosis not present

## 2017-06-19 DIAGNOSIS — R55 Syncope and collapse: Secondary | ICD-10-CM | POA: Diagnosis not present

## 2017-06-19 DIAGNOSIS — R001 Bradycardia, unspecified: Secondary | ICD-10-CM | POA: Diagnosis not present

## 2017-06-19 DIAGNOSIS — I480 Paroxysmal atrial fibrillation: Secondary | ICD-10-CM | POA: Diagnosis not present

## 2017-06-25 LAB — CUP PACEART REMOTE DEVICE CHECK
Implantable Pulse Generator Implant Date: 20180705
MDC IDC SESS DTM: 20190131193933

## 2017-07-16 ENCOUNTER — Ambulatory Visit (INDEPENDENT_AMBULATORY_CARE_PROVIDER_SITE_OTHER): Payer: PPO | Admitting: *Deleted

## 2017-07-16 DIAGNOSIS — R55 Syncope and collapse: Secondary | ICD-10-CM | POA: Diagnosis not present

## 2017-07-17 NOTE — Progress Notes (Signed)
Carelink Summary Report / Loop Recorder 

## 2017-07-30 DIAGNOSIS — D2262 Melanocytic nevi of left upper limb, including shoulder: Secondary | ICD-10-CM | POA: Diagnosis not present

## 2017-07-30 DIAGNOSIS — D485 Neoplasm of uncertain behavior of skin: Secondary | ICD-10-CM | POA: Diagnosis not present

## 2017-07-30 DIAGNOSIS — D225 Melanocytic nevi of trunk: Secondary | ICD-10-CM | POA: Diagnosis not present

## 2017-07-30 DIAGNOSIS — L570 Actinic keratosis: Secondary | ICD-10-CM | POA: Diagnosis not present

## 2017-07-30 DIAGNOSIS — D2272 Melanocytic nevi of left lower limb, including hip: Secondary | ICD-10-CM | POA: Diagnosis not present

## 2017-07-30 DIAGNOSIS — X32XXXA Exposure to sunlight, initial encounter: Secondary | ICD-10-CM | POA: Diagnosis not present

## 2017-07-30 DIAGNOSIS — D0462 Carcinoma in situ of skin of left upper limb, including shoulder: Secondary | ICD-10-CM | POA: Diagnosis not present

## 2017-07-30 DIAGNOSIS — D2261 Melanocytic nevi of right upper limb, including shoulder: Secondary | ICD-10-CM | POA: Diagnosis not present

## 2017-08-12 DIAGNOSIS — D0462 Carcinoma in situ of skin of left upper limb, including shoulder: Secondary | ICD-10-CM | POA: Diagnosis not present

## 2017-08-15 NOTE — Telephone Encounter (Signed)
complete

## 2017-08-16 ENCOUNTER — Other Ambulatory Visit: Payer: Self-pay | Admitting: Family Medicine

## 2017-08-19 ENCOUNTER — Ambulatory Visit (INDEPENDENT_AMBULATORY_CARE_PROVIDER_SITE_OTHER): Payer: PPO | Admitting: *Deleted

## 2017-08-19 DIAGNOSIS — R55 Syncope and collapse: Secondary | ICD-10-CM

## 2017-08-20 NOTE — Progress Notes (Signed)
Carelink Summary Report / Loop Recorder 

## 2017-08-27 LAB — CUP PACEART REMOTE DEVICE CHECK
Implantable Pulse Generator Implant Date: 20180705
MDC IDC SESS DTM: 20190305140158

## 2017-09-20 ENCOUNTER — Ambulatory Visit (INDEPENDENT_AMBULATORY_CARE_PROVIDER_SITE_OTHER): Payer: PPO | Admitting: *Deleted

## 2017-09-20 DIAGNOSIS — R55 Syncope and collapse: Secondary | ICD-10-CM

## 2017-09-20 LAB — CUP PACEART REMOTE DEVICE CHECK
MDC IDC PG IMPLANT DT: 20180705
MDC IDC SESS DTM: 20190407221114

## 2017-09-23 ENCOUNTER — Ambulatory Visit: Payer: Self-pay | Admitting: Family Medicine

## 2017-09-23 NOTE — Progress Notes (Signed)
Carelink Summary Report / Loop Recorder 

## 2017-09-24 ENCOUNTER — Ambulatory Visit: Payer: Self-pay | Admitting: Family Medicine

## 2017-09-24 ENCOUNTER — Telehealth: Payer: Self-pay | Admitting: Internal Medicine

## 2017-09-24 NOTE — Telephone Encounter (Signed)
Donut hole in the first of July. He would have to pay 25% of the cost of the medication at that time. He states he can not afford this. In talking with patient, I think he needs patient assistance not prior authorization as he is already getting his Eliquis through his pharmacy. He would like a written prescription to send to [place] [facility] if he cannot get patient assistance. Advised that we should start with the patient assistance application as well as have him call the patient assistance number (1-855-ELIQUIS). I will keep the physician portion of the application for Dr Caryl Comes to sign the next time he is in the office. I will leave patient's portion of application and the Eliquis Coverage Information Assistance pamphlet for patient to pick up later today. 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

## 2017-09-24 NOTE — Telephone Encounter (Signed)
Patient came by office Would like to speak with nurse about requesting a prior authorization for Eliquis He also would like to inquire about a written prescription Please call to discuss

## 2017-09-26 NOTE — Telephone Encounter (Signed)
I called and spoke with the patient to advise him Dr. Caryl Comes has signed his part of the patient assistance form for eliquis. He did pick up his part of the forms and states it will be Tuesday before he is back to the office to bring these in. I advised him if he will leave his portion of the form, we can then submit this for him.   He inquired about an Careers information officer for [facility]. I advised we will try to process patient assistance first, then we can ask Dr. Caryl Comes about signing a RX for him to send to [place] [facility].   The patient is agreeable.

## 2017-09-30 ENCOUNTER — Ambulatory Visit (INDEPENDENT_AMBULATORY_CARE_PROVIDER_SITE_OTHER): Payer: PPO | Admitting: Family Medicine

## 2017-09-30 ENCOUNTER — Encounter: Payer: Self-pay | Admitting: Family Medicine

## 2017-09-30 VITALS — BP 120/64 | HR 68 | Temp 98.2°F | Resp 14 | Wt 213.0 lb

## 2017-09-30 DIAGNOSIS — E1169 Type 2 diabetes mellitus with other specified complication: Secondary | ICD-10-CM

## 2017-09-30 DIAGNOSIS — H6091 Unspecified otitis externa, right ear: Secondary | ICD-10-CM | POA: Diagnosis not present

## 2017-09-30 DIAGNOSIS — I10 Essential (primary) hypertension: Secondary | ICD-10-CM

## 2017-09-30 DIAGNOSIS — E11610 Type 2 diabetes mellitus with diabetic neuropathic arthropathy: Secondary | ICD-10-CM

## 2017-09-30 DIAGNOSIS — E785 Hyperlipidemia, unspecified: Secondary | ICD-10-CM | POA: Diagnosis not present

## 2017-09-30 LAB — POCT GLYCOSYLATED HEMOGLOBIN (HGB A1C): Hemoglobin A1C: 5.9 % — AB (ref 4.0–5.6)

## 2017-09-30 MED ORDER — NEOMYCIN-POLYMYXIN-HC 3.5-10000-1 OT SUSP: 3.0000 [drp] | Freq: Three times a day (TID) | OTIC | 0 refills | Status: DC

## 2017-09-30 NOTE — Progress Notes (Signed)
Patient: Roberto Leonard Male    DOB: [birth year]   77 y.o.   MRN: [id number] Visit Date: 09/30/2017  Today's Provider: [name], MD   Chief Complaint  Patient presents with  . Diabetes   Subjective:    HPI  Diabetes Mellitus Type II, Follow-up:   Lab Results  Component Value Date   HGBA1C 5.8 (H) 03/29/2017   HGBA1C 5.9 (H) 11/19/2016   HGBA1C 5.9 05/16/2016    Last seen for diabetes 6 months ago.  Management since then includes none. He reports good compliance with treatment. He is not having side effects.  Home blood sugar records: not being checked much  Episodes of hypoglycemia? no   Current Insulin Regimen: n/a Most Recent Eye Exam: 04/08/17 Current exercise: walking  Pertinent Labs:    Component Value Date/Time   CHOL 157 03/29/2017 0808   CHOL 117 11/14/2015 0904   TRIG 109 03/29/2017 0808   HDL 45 03/29/2017 0808   HDL 39 (L) 11/14/2015 0904   LDLCALC 91 03/29/2017 0808   CREATININE 0.63 11/12/2016 0841    Wt Readings from Last 3 Encounters:  09/30/17 213 lb (96.6 kg)  03/27/17 218 lb (98.9 kg)  03/26/17 213 lb 4 oz (96.7 kg)    ------------------------------------------------------------------------ Pt would like his right ear looked at he has been having a lot of itching inside his ear. No pain or other symptoms.     Allergies  Allergen Reactions  . Sulfa Antibiotics Other (See Comments)    SWEATS, bad headache SWEATS, bad headache     Current Outpatient Medications:  .  apixaban (ELIQUIS) 5 MG TABS tablet, Take 1 tablet (5 mg total) by mouth 2 (two) times daily., Disp: 180 tablet, Rfl: 3 .  brimonidine (ALPHAGAN) 0.2 % ophthalmic solution, Place 1 drop into both eyes 2 (two) times daily., Disp: , Rfl: 3 .  Cetirizine HCl 10 MG CAPS, Take 10 mg by mouth daily. , Disp: , Rfl:  .  cholecalciferol (VITAMIN D) 1000 units tablet, Take 1,000 Units by mouth daily., Disp: , Rfl:  .  diltiazem (TIAZAC) 180 MG 24 hr capsule,  Take 180 mg daily by mouth., Disp: , Rfl:  .  dorzolamide-timolol (COSOPT) 22.3-6.8 MG/ML ophthalmic solution, Place 1 drop into both eyes 2 (two) times daily., Disp: , Rfl:  .  glucose blood (ONETOUCH VERIO) test strip, Test glucose daily and as needed, Disp: 100 each, Rfl: 12 .  losartan (COZAAR) 100 MG tablet, TAKE 1 TABLET (100 MG TOTAL) BY MOUTH DAILY IN THE MORNING, Disp: 90 tablet, Rfl: 3 .  Multiple Vitamin (MULTIVITAMIN WITH MINERALS) TABS tablet, Take 1 tablet by mouth daily., Disp: , Rfl:  .  Omega-3 Fatty Acids (FISH OIL BURP-LESS) 1200 MG CAPS, Take 2 capsules by mouth every evening. , Disp: , Rfl:  .  omeprazole (PRILOSEC) 20 MG capsule, TAKE 1 CAPSULE BY MOUTH EVERY DAY, Disp: 90 capsule, Rfl: 2 .  pioglitazone (ACTOS) 30 MG tablet, TAKE 1 TABLET (30 MG TOTAL) BY MOUTH DAILY. AM, Disp: 90 tablet, Rfl: 3 .  simvastatin (ZOCOR) 10 MG tablet, Take 10 mg daily by mouth., Disp: , Rfl:  .  simvastatin (ZOCOR) 20 MG tablet, TAKE 1 TABLET BY MOUTH DAILY (Patient not taking: Reported on 09/30/2017), Disp: 90 tablet, Rfl: 3  Review of Systems  Constitutional: Negative.   HENT: Negative.   Eyes: Negative.   Respiratory: Negative.   Cardiovascular: Negative.   Gastrointestinal: Negative.  Endocrine: Negative.   Genitourinary: Negative.   Musculoskeletal: Negative.   Skin: Negative.   Allergic/Immunologic: Negative.   Neurological: Negative.   Hematological: Negative.   Psychiatric/Behavioral: Negative.     Social History   Tobacco Use  . Smoking status: Former Smoker    Packs/day: 0.50    Years: 2.00    Pack years: 1.00    Types: Cigarettes    Last attempt to quit: 05/14/1963    Years since quitting: 54.4  . Smokeless tobacco: Never Used  Substance Use Topics  . Alcohol use: No    Alcohol/week: 0.0 oz   Objective:   BP 120/64 (BP Location: Left Arm, Patient Position: Sitting, Cuff Size: Normal)   Pulse 68   Temp 98.2 F (36.8 C) (Oral)   Resp 14   Wt 213 lb (96.6  kg)   BMI 27.35 kg/m  Vitals:   09/30/17 1504  BP: 120/64  Pulse: 68  Resp: 14  Temp: 98.2 F (36.8 C)  TempSrc: Oral  Weight: 213 lb (96.6 kg)     Physical Exam  Constitutional: He is oriented to person, place, and time. He appears well-developed and well-nourished.  HENT:  Head: Normocephalic and atraumatic.  Right Ear: External ear normal.  Left Ear: External ear normal.  Nose: Nose normal.  Mouth/Throat: Oropharynx is clear and moist.  Right EAC has debris in lower canal that possibly reflects mild External otitis.  Eyes: Conjunctivae are normal. No scleral icterus.  Neck: No thyromegaly present.  Cardiovascular: Normal rate, regular rhythm and normal heart sounds.  Pulmonary/Chest: Effort normal and breath sounds normal.  Abdominal: Soft.  Musculoskeletal: He exhibits no edema.  Neurological: He is alert and oriented to person, place, and time.  Skin: Skin is warm and dry.  Psychiatric: He has a normal mood and affect. His behavior is normal. Judgment and thought content normal.        Assessment & Plan:     1. Type 2 diabetes mellitus with diabetic neuropathic arthropathy, without long-term current use of insulin (HCC) Excellent control. RTC 4 months. - POCT HgB A1C--5.9 today.  2. Essential (primary) hypertension   3. Hyperlipidemia associated with type 2 diabetes mellitus ([place])  - Lipid panel  4. Otitis externa of right ear, unspecified chronicity, unspecified type  - neomycin-polymyxin-hydrocortisone (CORTISPORIN) 3.5-10000-1 OTIC suspension; Place 3 drops into the right ear 3 (three) times daily.  Dispense: 10 mL; Refill: 0      I have done the exam and reviewed the above chart and it is accurate to the best of my knowledge. Development worker, community has been used in this note in any air is in the dictation or transcription are unintentional.  [name], MD  [facility]

## 2017-10-01 DIAGNOSIS — E1169 Type 2 diabetes mellitus with other specified complication: Secondary | ICD-10-CM | POA: Diagnosis not present

## 2017-10-01 DIAGNOSIS — E785 Hyperlipidemia, unspecified: Secondary | ICD-10-CM | POA: Diagnosis not present

## 2017-10-01 NOTE — Telephone Encounter (Signed)
Patient picked up his patient assistance form today.

## 2017-10-01 NOTE — Telephone Encounter (Signed)
Patient came by office to pick up form

## 2017-10-02 ENCOUNTER — Telehealth: Payer: Self-pay

## 2017-10-02 LAB — LIPID PANEL
CHOL/HDL RATIO: 4.4 ratio (ref 0.0–5.0)
Cholesterol, Total: 149 mg/dL (ref 100–199)
HDL: 34 mg/dL — ABNORMAL LOW (ref >39)
LDL Calculated: 82 mg/dL (ref 0–99)
TRIGLYCERIDES: 163 mg/dL — AB (ref 0–149)
VLDL Cholesterol Cal: 33 mg/dL (ref 5–40)

## 2017-10-02 NOTE — Telephone Encounter (Signed)
Patient advised as below.  

## 2017-10-02 NOTE — Telephone Encounter (Signed)
-----   Message from [name]., MD sent at 10/02/2017  1:20 PM EDT ----- ok

## 2017-10-08 DIAGNOSIS — H40003 Preglaucoma, unspecified, bilateral: Secondary | ICD-10-CM | POA: Diagnosis not present

## 2017-10-15 LAB — CUP PACEART REMOTE DEVICE CHECK
MDC IDC PG IMPLANT DT: 20180705
MDC IDC SESS DTM: 20190510221012

## 2017-10-21 ENCOUNTER — Encounter: Payer: Self-pay | Admitting: Internal Medicine

## 2017-10-21 ENCOUNTER — Encounter: Payer: Self-pay | Admitting: *Deleted

## 2017-10-21 NOTE — Progress Notes (Signed)
Fax received from [employer] patient assistance foundation stating they had received the patient's application for patient assistance for eliquis 5 mg tablets. However, this stated that the patient was not elidigible due to his income.  Low income subsidy (L.I.S) form mailed to the patient along with the Coverage information assistance card for eliquis.

## 2017-10-22 ENCOUNTER — Other Ambulatory Visit: Payer: Self-pay | Admitting: *Deleted

## 2017-10-22 MED ORDER — APIXABAN 5 MG PO TABS: 5.0000 mg | ORAL_TABLET | Freq: Two times a day (BID) | ORAL | 3 refills | Status: DC

## 2017-10-22 NOTE — Telephone Encounter (Signed)
I called the patient and advised him that his eliquis RX is ready as he requested to try to obtain his medication from [place]. He is aware this will be placed at the front desk for pick up.

## 2017-10-22 NOTE — Telephone Encounter (Signed)
Patient dropped off letter for nurse Placed in nurse box

## 2017-10-23 ENCOUNTER — Ambulatory Visit (INDEPENDENT_AMBULATORY_CARE_PROVIDER_SITE_OTHER): Payer: PPO | Admitting: *Deleted

## 2017-10-23 DIAGNOSIS — R55 Syncope and collapse: Secondary | ICD-10-CM

## 2017-10-24 NOTE — Progress Notes (Signed)
Carelink Summary Report / Loop Recorder 

## 2017-10-28 DIAGNOSIS — R001 Bradycardia, unspecified: Secondary | ICD-10-CM | POA: Diagnosis not present

## 2017-10-28 DIAGNOSIS — I4510 Unspecified right bundle-branch block: Secondary | ICD-10-CM | POA: Diagnosis not present

## 2017-10-28 DIAGNOSIS — I480 Paroxysmal atrial fibrillation: Secondary | ICD-10-CM | POA: Diagnosis not present

## 2017-11-04 ENCOUNTER — Other Ambulatory Visit: Payer: Self-pay | Admitting: Family Medicine

## 2017-11-04 DIAGNOSIS — E11610 Type 2 diabetes mellitus with diabetic neuropathic arthropathy: Secondary | ICD-10-CM

## 2017-11-19 ENCOUNTER — Ambulatory Visit (INDEPENDENT_AMBULATORY_CARE_PROVIDER_SITE_OTHER): Payer: PPO | Admitting: Family Medicine

## 2017-11-19 ENCOUNTER — Encounter: Payer: Self-pay | Admitting: Family Medicine

## 2017-11-19 ENCOUNTER — Telehealth: Payer: Self-pay | Admitting: Family Medicine

## 2017-11-19 ENCOUNTER — Ambulatory Visit
Admission: RE | Admit: 2017-11-19 | Discharge: 2017-11-19 | Disposition: A | Discharge status: Home / Self Care | Payer: PPO | Source: Ambulatory Visit | Attending: Family Medicine | Admitting: Family Medicine

## 2017-11-19 VITALS — BP 120/70 | HR 55 | Temp 97.8°F | Resp 15 | Wt 211.6 lb

## 2017-11-19 DIAGNOSIS — K5900 Constipation, unspecified: Secondary | ICD-10-CM

## 2017-11-19 NOTE — Telephone Encounter (Signed)
Routed to pulmonary 

## 2017-11-19 NOTE — Telephone Encounter (Signed)
Patient returned rx to office with attached letter about not getting eliquis from [place] or [place] because of price increase   Placed in nurse box

## 2017-11-19 NOTE — Progress Notes (Signed)
  Subjective:     Patient ID: Roberto Leonard, male   DOB: 01/05/1941, 77 y.o.   MRN: [id number] Chief Complaint  Patient presents with  . Constipation    Patient comes in office with concerns of constipation for the past 5 days, patient states that he usually has a bowl movemenr every morning but while [place] at the beach he states he got "off schedule". Patient states that he has troed stool softners, laxatives and has done two enemas last night. Patient denies abdominal pain or lower back pain.    HPI States his normal pattern is daily bowel movements. Last colonoscopy in 2016 with diverticulosis. No nausea or vomiting but appetite is decreased.  Review of Systems     Objective:   Physical Exam  Constitutional: He appears well-developed and well-nourished. No distress.  Abdominal: Soft. Bowel sounds are normal. There is no tenderness.  No peristalsis waves noted. Patient defers rectal exam.       Assessment:    1. Constipation, unspecified constipation type: r/o obstruction - DG Abd 2 Views; Future    Plan:    If x-ray ok will try Dulcolax supps and repeat enema if necessary.

## 2017-11-19 NOTE — Patient Instructions (Signed)
If your x-ray is ok with no obstruction-try Dulcolax suppository. If no relief repeat Fleet's enema.

## 2017-11-19 NOTE — Telephone Encounter (Signed)
Pt called to see if his Xray results from earlier today were back. Please advise. Thanks TNP

## 2017-11-20 ENCOUNTER — Emergency Department: Payer: PPO

## 2017-11-20 ENCOUNTER — Emergency Department
Admission: EM | Admit: 2017-11-20 | Discharge: 2017-11-20 | Disposition: A | Discharge status: Home / Self Care | Payer: PPO | Attending: Emergency Medicine | Admitting: Emergency Medicine

## 2017-11-20 ENCOUNTER — Encounter: Payer: Self-pay | Admitting: Emergency Medicine

## 2017-11-20 DIAGNOSIS — Z7984 Long term (current) use of oral hypoglycemic drugs: Secondary | ICD-10-CM | POA: Insufficient documentation

## 2017-11-20 DIAGNOSIS — Z79899 Other long term (current) drug therapy: Secondary | ICD-10-CM | POA: Diagnosis not present

## 2017-11-20 DIAGNOSIS — E11610 Type 2 diabetes mellitus with diabetic neuropathic arthropathy: Secondary | ICD-10-CM | POA: Diagnosis not present

## 2017-11-20 DIAGNOSIS — K760 Fatty (change of) liver, not elsewhere classified: Secondary | ICD-10-CM | POA: Diagnosis not present

## 2017-11-20 DIAGNOSIS — K5900 Constipation, unspecified: Secondary | ICD-10-CM | POA: Diagnosis not present

## 2017-11-20 DIAGNOSIS — I10 Essential (primary) hypertension: Secondary | ICD-10-CM | POA: Diagnosis not present

## 2017-11-20 DIAGNOSIS — Z87891 Personal history of nicotine dependence: Secondary | ICD-10-CM | POA: Insufficient documentation

## 2017-11-20 NOTE — ED Triage Notes (Signed)
Patient presents to ED via POV from home with c/o constipation x 6 days. Patient was seen at his PCP office yesterday and had xray done which did not show a blockage. Patient has tried stool softeners and enemas with no relief. Patient denies pain.

## 2017-11-20 NOTE — ED Provider Notes (Signed)
[facility] Emergency Department Provider Note  ____________________________________________   First MD Initiated Contact with Patient 11/20/17 1510     (approximate)  I have reviewed the triage vital signs and the nursing notes.   HISTORY  Chief Complaint Constipation   HPI Roberto Leonard is a 77 y.o. male presents to the emergency department with inability to defecate for the past 6 days.  He is passing flatus.  He went to his primary care physician yesterday who performed a plain film x-ray and told him "he was not blocked" and gave him stool softeners.  The patient is also use over-the-counter enemas with no relief.   He does have a remote history of hernia repair.  He denies fevers or chills.  No nausea or vomiting.  He says that he is remained well-hydrated.  He has had issues with constipation in the past.  His symptoms have been gradual onset are now constant moderate severity.  Nothing seems to make them better or worse.   Past Medical History:  Diagnosis Date  . Allergy   . Detached retina    RIGHT  . Diabetes mellitus without complication (HCC)    ORAL MED  . Dupuytren's contracture   . ED (erectile dysfunction)   . GERD (gastroesophageal reflux disease)   . Glaucoma   . Hyperlipidemia   . Hypertension     Patient Active Problem List   Diagnosis Date Noted  . Syncope 11/11/2016  . Memory loss 04/30/2015  . Special screening for malignant neoplasms, colon   . Benign neoplasm of sigmoid colon   . Allergic rhinitis 09/24/2014  . 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 Cheilitis 09/24/2014  . Abnormal prostate specific antigen 09/24/2014  . Erectile dysfunction associated with type 2 diabetes mellitus ([place]) 09/24/2014  . 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 Essential (primary) hypertension 09/24/2014  . Acid reflux 09/24/2014  . Glaucoma 09/24/2014  . Inguinal hernia 09/24/2014  .  Hypercholesteremia 09/24/2014  . Detached retina 09/24/2014  . 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 CATARACT EXTRACTION     right  . COLONOSCOPY WITH PROPOFOL N/A 01/03/2015   Procedure: COLONOSCOPY WITH PROPOFOL;  Surgeon: Lucilla Lame, MD;  Location: [facility];  Service: Endoscopy;  Laterality: N/A;  DIABETIC-ORAL MEDS  . HERNIA REPAIR    . LOOP RECORDER INSERTION N/A 11/15/2016   Procedure: Loop Recorder Insertion;  Surgeon: Deboraha Sprang, MD;  Location: [facility];  Service: Cardiovascular;  Laterality: N/A;  . POLYPECTOMY N/A 01/03/2015   Procedure: POLYPECTOMY;  Surgeon: Lucilla Lame, MD;  Location: [facility];  Service: Endoscopy;  Laterality: N/A;  Sigmoid polyp  . RETINAL DETACHMENT SURGERY     right eye, wrong size lense, secondary surgery to replace it    Prior to Admission medications   Medication Sig Start Date End Date Taking? Authorizing Provider  apixaban (ELIQUIS) 5 MG TABS tablet Take 1 tablet (5 mg total) by mouth 2 (two) times daily. 10/22/17   Deboraha Sprang, MD  brimonidine (ALPHAGAN) 0.2 % ophthalmic solution Place 1 drop into both eyes 2 (two) times daily. 04/11/15   [provider]  Cetirizine HCl 10 MG CAPS Take 10 mg by mouth daily.  03/26/11   [provider]  cholecalciferol (VITAMIN D) 1000 units tablet Take 1,000 Units by mouth daily.  [provider]  diltiazem (TIAZAC) 180 MG 24 hr capsule Take 180 mg daily by mouth.    [provider]  dorzolamide-timolol (COSOPT) 22.3-6.8 MG/ML ophthalmic solution Place 1 drop into both eyes 2 (two) times daily.    [provider]  glucose blood (ONETOUCH VERIO) test strip Test glucose daily and as needed 11/20/16   [name]., MD  losartan (COZAAR) 100 MG tablet TAKE 1 TABLET (100 MG TOTAL) BY MOUTH DAILY IN THE MORNING 04/29/17   [name]., MD  Multiple Vitamin (MULTIVITAMIN WITH MINERALS) TABS tablet Take 1 tablet by mouth daily.    [provider]  neomycin-polymyxin-hydrocortisone (CORTISPORIN) 3.5-10000-1 OTIC suspension Place 3 drops into the right ear 3 (three) times daily. 09/30/17   [name]., MD  Omega-3 Fatty Acids (FISH OIL BURP-LESS) 1200 MG CAPS Take 2 capsules by mouth every evening.  03/26/11   [provider]  omeprazole (PRILOSEC) 20 MG capsule TAKE 1 CAPSULE BY MOUTH EVERY DAY 08/16/17   [name]., MD  pioglitazone (ACTOS) 30 MG tablet TAKE 1 TABLET BY MOUTH DAILY IN THE MORNING 11/04/17   [name]., MD  simvastatin (ZOCOR) 10 MG tablet Take 10 mg daily by mouth.    [provider]    Allergies Sulfa antibiotics  Family History  Problem Relation Age of Onset  . Heart disease Mother        Died from CHF  . Diabetes Mother   . Diabetes Father   . Hypertension Father   . Heart disease Father        plaque build up  . Diabetes Brother   . Diabetes Daughter        type 1    Social History Social History   Tobacco Use  . Smoking status: Former Smoker    Packs/day: 0.50    Years: 2.00    Pack years: 1.00    Types: Cigarettes    Last attempt to quit: 05/14/1963    Years since quitting: 54.5  . Smokeless tobacco: Never Used  Substance Use Topics  . Alcohol use: No    Alcohol/week: 0.0 oz  . Drug use: No    Review of Systems Constitutional: No fever/chills Eyes: No visual changes. ENT: No sore throat. Cardiovascular: Denies chest pain. Respiratory: Denies shortness of breath. Gastrointestinal: No abdominal pain.  No nausea, no vomiting.  No diarrhea.  Positive for constipation. Genitourinary: Negative for dysuria. Musculoskeletal: Negative for back pain. Skin: Negative for rash. Neurological: Negative for headaches, focal weakness or numbness.   ____________________________________________   PHYSICAL  EXAM:  VITAL SIGNS: ED Triage Vitals [11/20/17 1308]  Enc Vitals Group     BP 132/76     Pulse Rate 70     Resp 17     Temp 97.8 F (36.6 C)     Temp Source Oral     SpO2 97 %     Weight 211 lb (95.7 kg)     Height 6\' 2"  (1.88 m)     Head Circumference      Peak Flow      Pain Score 0     Pain Loc      Pain Edu?      Excl. in [place]?     Constitutional: Alert and oriented x4 appears obviously uncomfortable nontoxic no diaphoresis speaks full clear sentences Eyes: PERRL EOMI. Head: Atraumatic. Nose: No congestion/rhinnorhea. Mouth/Throat: No trismus Neck: No stridor.  Cardiovascular: Normal rate, regular rhythm. Grossly normal heart sounds.  Good peripheral circulation. Respiratory: Normal respiratory effort.  No retractions. Lungs CTAB and moving good air Gastrointestinal: Soft nondistended nontender no rebound or guarding no peritonitis Musculoskeletal: No lower extremity edema   Neurologic:  Normal speech and language. No gross focal neurologic deficits are appreciated. Skin:  Skin is warm, dry and intact. No rash noted. Psychiatric: Mood and affect are normal. Speech and behavior are normal.    ____________________________________________   DIFFERENTIAL includes but not limited to  Small bowel obstruction, volvulus, large bowel obstruction, functional constipation, fecal impaction ____________________________________________   LABS (all labs ordered are listed, but only abnormal results are displayed)  Labs Reviewed - No data to display   __________________________________________  EKG   ____________________________________________  RADIOLOGY  CT abdomen pelvis reviewed by me consistent with large fecal load but no impaction no obstruction ____________________________________________   PROCEDURES  Procedure(s) performed: no  Procedures  Critical Care performed: no  ____________________________________________   INITIAL IMPRESSION / ASSESSMENT  AND PLAN / ED COURSE  Pertinent labs & imaging results that were available during my care of the patient were reviewed by me and considered in my medical decision making (see chart for details).   Patient has been unable to defecate for 6 days raising concern for bowel obstruction so obtained a CT scan today which fortunately does not show an impaction or obstruction.  I have encouraged the patient to remain well-hydrated and given instructions on magnesium citrate Dulcolax and Colace as well as fiber to help him begin to move his bowels.  He is able to eat and drink and would prefer outpatient management.  Strict return precautions have been given.      ____________________________________________   FINAL CLINICAL IMPRESSION(S) / ED DIAGNOSES  Final diagnoses:  Constipation, unspecified constipation type      NEW MEDICATIONS STARTED DURING THIS VISIT:  Discharge Medication List as of 11/20/2017  3:34 PM       Note:  This document was prepared using Dragon voice recognition software and may include unintentional dictation errors.     Darel Hong, MD 11/22/17 1352

## 2017-11-20 NOTE — ED Notes (Signed)
Spoke with Dr. Cinda Quest in regards to patient presentation. No orders at this time.

## 2017-11-20 NOTE — Telephone Encounter (Signed)
Patient notified at the end of the day that x-ray results still pending.

## 2017-11-20 NOTE — Telephone Encounter (Signed)
Sent to correct pool

## 2017-11-20 NOTE — Discharge Instructions (Signed)
Please make sure you remain well-hydrated and drink enough fluids so that your urine is relatively clear.  I am recommending following over-the-counter medications to help with your constipation:  Magnesium citrate drink 1 bottle every day until you have good bowel movements Dulcolax over-the-counter oral take 20 mg by mouth twice a day until you have a good bowel movement Colace 200 mg by mouth twice a day until you have good bowel movements Purchase over-the-counter MiraLAX and use at least 2 scoops twice a day until you have good bowel movements.  It was a pleasure to take care of you today, and thank you for coming to our emergency department.  If you have any questions or concerns before leaving please ask the nurse to grab me and I'm more than happy to go through your aftercare instructions again.  If you were prescribed any opioid pain medication today such as Norco, Vicodin, Percocet, morphine, hydrocodone, or oxycodone please make sure you do not drive when you are taking this medication as it can alter your ability to drive safely.  If you have any concerns once you are home that you are not improving or are in fact getting worse before you can make it to your follow-up appointment, please do not hesitate to call 911 and come back for further evaluation.  Darel Hong, MD  Results for orders placed or performed in visit on 09/30/17  Lipid panel  Result Value Ref Range   Cholesterol, Total 149 100 - 199 mg/dL   Triglycerides 163 (H) 0 - 149 mg/dL   HDL 34 (L) >39 mg/dL   VLDL Cholesterol Cal 33 5 - 40 mg/dL   LDL Calculated 82 0 - 99 mg/dL   Chol/HDL Ratio 4.4 0.0 - 5.0 ratio  POCT HgB A1C  Result Value Ref Range   Hemoglobin A1C 5.9 (A) 4.0 - 5.6 %   HbA1c, POC (prediabetic range)  5.7 - 6.4 %   HbA1c, POC (controlled diabetic range)  0.0 - 7.0 %   Ct Abdomen Pelvis Wo Contrast  Result Date: 11/20/2017 CLINICAL DATA:  Constipation. EXAM: CT ABDOMEN AND PELVIS WITHOUT  CONTRAST TECHNIQUE: Multidetector CT imaging of the abdomen and pelvis was performed following the standard protocol without IV contrast. COMPARISON:  None. FINDINGS: Lower chest: No acute abnormality. Hepatobiliary: No gallstones are noted. Fatty infiltration of the liver is noted. No biliary dilatation is noted. Pancreas: Unremarkable. No pancreatic ductal dilatation or surrounding inflammatory changes. Spleen: Normal in size without focal abnormality. Adrenals/Urinary Tract: Adrenal glands are unremarkable. Bilateral renal cysts are noted. No hydronephrosis or renal obstruction is noted. No renal or ureteral calculi are noted. Urinary bladder is unremarkable. Stomach/Bowel: The stomach and appendix are unremarkable. There is no evidence of bowel obstruction or inflammation. Mild amount of stool is noted in the right and transverse colon. Vascular/Lymphatic: Aortic atherosclerosis. No enlarged abdominal or pelvic lymph nodes. Reproductive: Moderate prostatic enlargement is noted. Other: Surgical clips are seen in right lower quadrant consistent with prior hernia repair. No abnormal fluid collection is noted. Musculoskeletal: No acute or significant osseous findings. IMPRESSION: Fatty infiltration of the liver. Moderate prostatic enlargement. Mild amount of stool seen in the right and transverse colon. There is no evidence of bowel obstruction or inflammation. Aortic Atherosclerosis (ICD10-I70.0). Electronically Signed   By: Marijo Conception, M.D.   On: 11/20/2017 15:14   Dg Abd 2 Views  Result Date: 11/19/2017 CLINICAL DATA:  Constipated, last bowel movement 5 days ago EXAM: ABDOMEN - 2 VIEW  COMPARISON:  None. FINDINGS: There is a moderate amount of stool in the ascending colon. There is no bowel dilatation to suggest obstruction. There is no evidence of pneumoperitoneum, portal venous gas or pneumatosis. There are no pathologic calcifications along the expected course of the ureters. The osseous structures are  unremarkable. IMPRESSION: Moderate amount of stool in the ascending colon. Electronically Signed   By: Kathreen Devoid   On: 11/19/2017 16:48

## 2017-11-21 ENCOUNTER — Telehealth: Payer: Self-pay | Admitting: Internal Medicine

## 2017-11-21 NOTE — Telephone Encounter (Signed)
I left a message on the patient's identified voice mail to follow up regarding his eliquis. I advised I wanted to make sure he had his medication and to see if he had tried to call the [facility] coverage information assistance # to speak with a reimbursement specialist.  I asked that he call back.

## 2017-11-21 NOTE — Telephone Encounter (Signed)
Noted  

## 2017-11-21 NOTE — Telephone Encounter (Signed)
Pt states he received a call regarding his Eliquis., states he is taking his Eliquis and everything is ok.

## 2017-11-25 ENCOUNTER — Ambulatory Visit (INDEPENDENT_AMBULATORY_CARE_PROVIDER_SITE_OTHER): Payer: PPO | Admitting: *Deleted

## 2017-11-25 DIAGNOSIS — R55 Syncope and collapse: Secondary | ICD-10-CM | POA: Diagnosis not present

## 2017-11-26 NOTE — Progress Notes (Signed)
Carelink Summary Report / Loop Recorder 

## 2017-11-29 LAB — CUP PACEART REMOTE DEVICE CHECK
Implantable Pulse Generator Implant Date: 20180705
MDC IDC SESS DTM: 20190612223515

## 2017-12-12 ENCOUNTER — Telehealth: Payer: Self-pay | Admitting: Family Medicine

## 2017-12-12 NOTE — Telephone Encounter (Signed)
I spoke with the patient to schedule AWV-S w/ [facility].  He asked that I call him back after 1:00 when he's home with calendar. VDM (DD)

## 2017-12-12 NOTE — Telephone Encounter (Signed)
Scheduled

## 2017-12-13 ENCOUNTER — Ambulatory Visit: Payer: PPO

## 2017-12-23 ENCOUNTER — Ambulatory Visit (INDEPENDENT_AMBULATORY_CARE_PROVIDER_SITE_OTHER): Payer: PPO

## 2017-12-23 VITALS — BP 134/72 | HR 61 | Temp 97.8°F | Ht 74.0 in | Wt 216.6 lb

## 2017-12-23 DIAGNOSIS — Z0000 Encounter for general adult medical examination without abnormal findings: Secondary | ICD-10-CM

## 2017-12-23 NOTE — Progress Notes (Signed)
Subjective:   Roberto Leonard is a 77 y.o. male who presents for Medicare Annual/Subsequent preventive examination.  Review of Systems:  N/A  Cardiac Risk Factors include: advanced age (>31men, >30 women);diabetes mellitus;dyslipidemia;male gender     Objective:    Vitals: BP 134/72 (BP Location: Right Arm)   Pulse 61   Temp 97.8 F (36.6 C) (Oral)   Ht 6\' 2"  (1.88 m)   Wt 216 lb 9.6 oz (98.2 kg)   BMI 27.81 kg/m   Body mass index is 27.81 kg/m.  Advanced Directives 12/23/2017 11/15/2016 11/11/2016 05/16/2016 11/14/2015 08/11/2015 04/30/2015  Does Patient Have a Medical Advance Directive? Yes No Yes Yes Yes Yes No  Type of Paramedic of [place];Living will - [place];Living will [place];Living will Living will;Healthcare Power of [place];Living will -  Does patient want to make changes to medical advance directive? - - No - Patient declined - - - -  Copy of [place] in Chart? No - copy requested - No - copy requested - - - -  Would patient like information on creating a medical advance directive? - No - Patient declined - - - - No - patient declined information    Tobacco Social History   Tobacco Use  Smoking Status Former Smoker  . Packs/day: 0.50  . Years: 2.00  . Pack years: 1.00  . Types: Cigarettes  . Last attempt to quit: 05/14/1963  . Years since quitting: 54.6  Smokeless Tobacco Never Used     Counseling given: Not Answered   Clinical Intake:  Pre-visit preparation completed: Yes  Pain : No/denies pain Pain Score: 0-No pain     Nutritional Status: BMI 25 -29 Overweight Nutritional Risks: None Diabetes: Yes(type 2) CBG done?: No Did pt. bring in CBG monitor from home?: No  How often do you need to have someone help you when you read instructions, pamphlets, or other written materials from your doctor or pharmacy?: 1 - Never  Interpreter  Needed?: No  Information entered by :: [facility], LPN  Past Medical History:  Diagnosis Date  . Allergy   . Detached retina    RIGHT  . Diabetes mellitus without complication (HCC)    ORAL MED  . Dupuytren's contracture   . ED (erectile dysfunction)   . GERD (gastroesophageal reflux disease)   . Glaucoma   . Hyperlipidemia   . Hypertension    Past Surgical History:  Procedure Laterality Date  . CATARACT EXTRACTION     right  . COLONOSCOPY WITH PROPOFOL N/A 01/03/2015   Procedure: COLONOSCOPY WITH PROPOFOL;  Surgeon: Lucilla Lame, MD;  Location: [place];  Service: Endoscopy;  Laterality: N/A;  DIABETIC-ORAL MEDS  . HERNIA REPAIR    . LOOP RECORDER INSERTION N/A 11/15/2016   Procedure: Loop Recorder Insertion;  Surgeon: Deboraha Sprang, MD;  Location: [place] [facility];  Service: Cardiovascular;  Laterality: N/A;  . POLYPECTOMY N/A 01/03/2015   Procedure: POLYPECTOMY;  Surgeon: Lucilla Lame, MD;  Location: [place];  Service: Endoscopy;  Laterality: N/A;  Sigmoid polyp  . RETINAL DETACHMENT SURGERY     right eye, wrong size lense, secondary surgery to replace it   Family History  Problem Relation Age of Onset  . Heart disease Mother        Died from CHF  . Diabetes Mother   . Diabetes Father   . Hypertension Father   . Heart  disease Father        plaque build up  . Diabetes Brother   . Diabetes Daughter        type 1   Social History   Socioeconomic History  . Marital status: Married    Spouse name: Pricilla  . Number of children: 3  . Years of education: college  . Highest education level: Bachelor's degree (e.g., BA, AB, BS)  Occupational History  . Occupation: Retired  . 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 Financial resource strain: Not hard at all  . Food insecurity:    Worry: Never true    Inability: Never true  . Transportation needs:    Medical:  No    Non-medical: No  Tobacco Use  . Smoking status: Former Smoker    Packs/day: 0.50    Years: 2.00    Pack years: 1.00    Types: Cigarettes    Last attempt to quit: 05/14/1963    Years since quitting: 54.6  . Smokeless tobacco: Never Used  Substance and Sexual Activity  . Alcohol use: No    Alcohol/week: 0.0 standard drinks  . Drug use: No  . Sexual activity: Not on file  Lifestyle  . Physical activity:    Days per week: Not on file    Minutes per session: Not on file  . Stress: Not at all  Relationships  . Social connections:    Talks on phone: Not on file    Gets together: Not on file    Attends religious service: Not on file    Active member of club or organization: Not on file    Attends meetings of clubs or organizations: Not on file    Relationship status: Not on file  Other Topics Concern  . Not on file  Social History Narrative  . Not on file    Outpatient Encounter Medications as of 12/23/2017  Medication Sig  . apixaban (ELIQUIS) 5 MG TABS tablet Take 1 tablet (5 mg total) by mouth 2 (two) times daily.  . brimonidine (ALPHAGAN) 0.2 % ophthalmic solution Place 1 drop into both eyes 2 (two) times daily.  . Cetirizine HCl 10 MG CAPS Take 10 mg by mouth daily.   . cholecalciferol (VITAMIN D) 1000 units tablet Take 1,000 Units by mouth daily.  Marland Kitchen diltiazem (TIAZAC) 180 MG 24 hr capsule Take 180 mg daily by mouth.  . dorzolamide-timolol (COSOPT) 22.3-6.8 MG/ML ophthalmic solution Place 1 drop into both eyes 2 (two) times daily.  Marland Kitchen glucose blood (ONETOUCH VERIO) test strip Test glucose daily and as needed  . losartan (COZAAR) 100 MG tablet TAKE 1 TABLET (100 MG TOTAL) BY MOUTH DAILY IN THE MORNING  . Multiple Vitamin (MULTIVITAMIN WITH MINERALS) TABS tablet Take 1 tablet by mouth daily.  . Omega-3 Fatty Acids (FISH OIL BURP-LESS) 1200 MG CAPS Take 2 capsules by mouth every evening.   Marland Kitchen omeprazole (PRILOSEC) 20 MG capsule TAKE 1 CAPSULE BY MOUTH EVERY DAY  .  pioglitazone (ACTOS) 30 MG tablet TAKE 1 TABLET BY MOUTH DAILY IN THE MORNING  . simvastatin (ZOCOR) 10 MG tablet Take 10 mg daily by mouth.  . neomycin-polymyxin-hydrocortisone (CORTISPORIN) 3.5-10000-1 OTIC suspension Place 3 drops into the right ear 3 (three) times daily. (Patient not taking: Reported on 12/23/2017)   No facility-administered encounter medications on file as of 12/23/2017.     Activities of Daily Living  In your present state of health, do you have any difficulty performing the following activities: 12/23/2017  Hearing? N  Vision? N  Difficulty concentrating or making decisions? N  Walking or climbing stairs? N  Dressing or bathing? N  Doing errands, shopping? N  Preparing Food and eating ? N  Using the Toilet? N  In the past six months, have you accidently leaked urine? N  Do you have problems with loss of bowel control? N  Managing your Medications? N  Managing your Finances? N  Housekeeping or managing your Housekeeping? N  Some recent data might be hidden    Patient Care Team: [name]., MD as PCP - General (Family Medicine) Deboraha Sprang, MD as Consulting Physician (Cardiology) Isaias Cowman, MD as Consulting Physician (Cardiology) Estill Cotta, MD (Ophthalmology)   Assessment:   This is a routine wellness examination for [place].  Exercise Activities and Dietary recommendations Current Exercise Habits: Home exercise routine, Type of exercise: walking(2 miles ), Time (Minutes): 30, Frequency (Times/Week): 6, Weekly Exercise (Minutes/Week): 180, Intensity: Mild, Exercise limited by: None identified  Goals    . DIET - INCREASE WATER INTAKE     Recommend increasing water intake to 4-6 glasses a day.        Fall Risk Fall Risk  12/23/2017 09/30/2017 05/16/2016  Falls in the past year? No No No   Is the patient's home free of loose throw rugs in walkways, pet beds, electrical cords, etc?   yes      Grab bars in the bathroom? yes       Handrails on the stairs?   no      Adequate lighting?   yes  Timed Get Up and Go Performed: N/A  Depression Screen PHQ 2/9 Scores 12/23/2017 09/30/2017 05/16/2016  PHQ - 2 Score 0 0 0    Cognitive Function     6CIT Screen 12/23/2017  What Year? 0 points  What month? 0 points  What time? 0 points  Count back from 20 0 points  Months in reverse 0 points  Repeat phrase 0 points  Total Score 0    Immunization History  Administered Date(s) Administered  . Hepatitis B, adult 05/16/2016, 08/29/2016, 11/19/2016  . Influenza Split 01/27/2016  . Influenza, High Dose Seasonal PF 02/10/2015  . Influenza-Unspecified 01/29/2017  . Pneumococcal Conjugate-13 01/12/2014  . Pneumococcal Polysaccharide-23 06/23/1998, 03/08/2005  . Td 05/21/2006  . Zoster 09/12/2005  . Zoster Recombinat (Shingrix) 08/30/2016, 11/10/2016    Qualifies for Shingles Vaccine? Up to date  Screening Tests Health Maintenance  Topic Date Due  . TETANUS/TDAP  05/21/2016  . FOOT EXAM  05/16/2017  . INFLUENZA VACCINE  12/12/2017  . HEMOGLOBIN A1C  04/02/2018  . OPHTHALMOLOGY EXAM  04/08/2018  . COLONOSCOPY  01/03/2020  . PNA vac Low Risk Adult  Completed   Cancer Screenings: Lung: Low Dose CT Chest recommended if Age 80-80 years, 30 pack-year currently smoking OR have quit w/in 15years. Patient does not qualify. Colorectal: Up to date  Additional Screenings:  Hepatitis C Screening: N/A      Plan:  I have personally reviewed and addressed the Medicare Annual Wellness questionnaire and have noted the following in the patient's chart:  A. Medical and social history B. Use of alcohol, tobacco or illicit drugs  C. Current medications and supplements D. Functional ability and status E.  Nutritional status F.  Physical activity G. Advance directives H. List of other physicians I.  Hospitalizations, surgeries, and ER  visits in previous 12 months J.  [place] such as hearing and vision if  needed, cognitive and depression L. Referrals and appointments - none  In addition, I have reviewed and discussed with patient certain preventive protocols, quality metrics, and best practice recommendations. A written personalized care plan for preventive services as well as general preventive health recommendations were provided to patient.  See attached scanned questionnaire for additional information.   Signed,  Fabio Neighbors, LPN Nurse Health Advisor   Nurse Recommendations: Pt needs a diabetic foot exam at next OV. Pt declined the tetanus vaccine today.

## 2017-12-23 NOTE — Patient Instructions (Addendum)
Roberto Leonard , Thank you for taking time to come for your Medicare Wellness Visit. I appreciate your ongoing commitment to your health goals. Please review the following plan we discussed and let me know if I can assist you in the future.   Screening recommendations/referrals: Colonoscopy: Up to date Recommended yearly ophthalmology/optometry visit for glaucoma screening and checkup Recommended yearly dental visit for hygiene and checkup  Vaccinations: Influenza vaccine: Up to date Pneumococcal vaccine: Up to date Tdap vaccine: Pt declines today.  Shingles vaccine: Up to date    Advanced directives: Please bring a copy of your POA (Power of [place]) and/or Living Will to your next appointment.   Conditions/risks identified: Recommend increasing water intake to 4-6 glasses a day.   Next appointment: 02/03/18 with Dr Rosanna Randy.   Preventive Care 3 Years and Older, Male Preventive care refers to lifestyle choices and visits with your health care provider that can promote health and wellness. What does preventive care include?  A yearly physical exam. This is also called an annual well check.  Dental exams once or twice a year.  Routine eye exams. Ask your health care provider how often you should have your eyes checked.  Personal lifestyle choices, including:  Daily care of your teeth and gums.  Regular physical activity.  Eating a healthy diet.  Avoiding tobacco and drug use.  Limiting alcohol use.  Practicing safe sex.  Taking low doses of aspirin every day.  Taking vitamin and mineral supplements as recommended by your health care provider. What happens during an annual well check? The services and screenings done by your health care provider during your annual well check will depend on your age, overall health, lifestyle risk factors, and family history of disease. Counseling  Your health care provider may ask you questions about your:  Alcohol use.  Tobacco  use.  Drug use.  Emotional well-being.  Home and relationship well-being.  Sexual activity.  Eating habits.  History of falls.  Memory and ability to understand (cognition).  Work and work Statistician. Screening  You may have the following tests or measurements:  Height, weight, and BMI.  Blood pressure.  Lipid and cholesterol levels. These may be checked every 5 years, or more frequently if you are over 52 years old.  Skin check.  Lung cancer screening. You may have this screening every year starting at age 49 if you have a 30-pack-year history of smoking and currently smoke or have quit within the past 15 years.  Fecal occult blood test (FOBT) of the stool. You may have this test every year starting at age 21.  Flexible sigmoidoscopy or colonoscopy. You may have a sigmoidoscopy every 5 years or a colonoscopy every 10 years starting at age 57.  Prostate cancer screening. Recommendations will vary depending on your family history and other risks.  Hepatitis C blood test.  Hepatitis B blood test.  Sexually transmitted disease (STD) testing.  Diabetes screening. This is done by checking your blood sugar (glucose) after you have not eaten for a while (fasting). You may have this done every 1-3 years.  Abdominal aortic aneurysm (AAA) screening. You may need this if you are a current or former smoker.  Osteoporosis. You may be screened starting at age 57 if you are at high risk. Talk with your health care provider about your test results, treatment options, and if necessary, the need for more tests. Vaccines  Your health care provider may recommend certain vaccines, such as:  Influenza vaccine.  This is recommended every year.  Tetanus, diphtheria, and acellular pertussis (Tdap, Td) vaccine. You may need a Td booster every 10 years.  Zoster vaccine. You may need this after age 67.  Pneumococcal 13-valent conjugate (PCV13) vaccine. One dose is recommended after age  71.  Pneumococcal polysaccharide (PPSV23) vaccine. One dose is recommended after age 86. Talk to your health care provider about which screenings and vaccines you need and how often you need them. This information is not intended to replace advice given to you by your health care provider. Make sure you discuss any questions you have with your health care provider. Document Released: 05/27/2015 Document Revised: 01/18/2016 Document Reviewed: 03/01/2015 Elsevier Interactive Patient Education  2017 [place] Prevention in the Home Falls can cause injuries. They can happen to people of all ages. There are many things you can do to make your home safe and to help prevent falls. What can I do on the outside of my home?  Regularly fix the edges of walkways and driveways and fix any cracks.  Remove anything that might make you trip as you walk through a door, such as a raised step or threshold.  Trim any bushes or trees on the path to your home.  Use bright outdoor lighting.  Clear any walking paths of anything that might make someone trip, such as rocks or tools.  Regularly check to see if handrails are loose or broken. Make sure that both sides of any steps have handrails.  Any raised decks and porches should have guardrails on the edges.  Have any leaves, snow, or ice cleared regularly.  Use sand or salt on walking paths during winter.  Clean up any spills in your garage right away. This includes oil or grease spills. What can I do in the bathroom?  Use night lights.  Install grab bars by the toilet and in the tub and shower. Do not use towel bars as grab bars.  Use non-skid mats or decals in the tub or shower.  If you need to sit down in the shower, use a plastic, non-slip stool.  Keep the floor dry. Clean up any water that spills on the floor as soon as it happens.  Remove soap buildup in the tub or shower regularly.  Attach bath mats securely with double-sided  non-slip rug tape.  Do not have throw rugs and other things on the floor that can make you trip. What can I do in the bedroom?  Use night lights.  Make sure that you have a light by your bed that is easy to reach.  Do not use any sheets or blankets that are too big for your bed. They should not hang down onto the floor.  Have a firm chair that has side arms. You can use this for support while you get dressed.  Do not have throw rugs and other things on the floor that can make you trip. What can I do in the kitchen?  Clean up any spills right away.  Avoid walking on wet floors.  Keep items that you use a lot in easy-to-reach places.  If you need to reach something above you, use a strong step stool that has a grab bar.  Keep electrical cords out of the way.  Do not use floor polish or wax that makes floors slippery. If you must use wax, use non-skid floor wax.  Do not have throw rugs and other things on the floor that can make  you trip. What can I do with my stairs?  Do not leave any items on the stairs.  Make sure that there are handrails on both sides of the stairs and use them. Fix handrails that are broken or loose. Make sure that handrails are as long as the stairways.  Check any carpeting to make sure that it is firmly attached to the stairs. Fix any carpet that is loose or worn.  Avoid having throw rugs at the top or bottom of the stairs. If you do have throw rugs, attach them to the floor with carpet tape.  Make sure that you have a light switch at the top of the stairs and the bottom of the stairs. If you do not have them, ask someone to add them for you. What else can I do to help prevent falls?  Wear shoes that:  Do not have high heels.  Have rubber bottoms.  Are comfortable and fit you well.  Are closed at the toe. Do not wear sandals.  If you use a stepladder:  Make sure that it is fully opened. Do not climb a closed stepladder.  Make sure that both  sides of the stepladder are locked into place.  Ask someone to hold it for you, if possible.  Clearly mark and make sure that you can see:  Any grab bars or handrails.  First and last steps.  Where the edge of each step is.  Use tools that help you move around (mobility aids) if they are needed. These include:  Canes.  Walkers.  Scooters.  Crutches.  Turn on the lights when you go into a dark area. Replace any light bulbs as soon as they burn out.  Set up your furniture so you have a clear path. Avoid moving your furniture around.  If any of your floors are uneven, fix them.  If there are any pets around you, be aware of where they are.  Review your medicines with your doctor. Some medicines can make you feel dizzy. This can increase your chance of falling. Ask your doctor what other things that you can do to help prevent falls. This information is not intended to replace advice given to you by your health care provider. Make sure you discuss any questions you have with your health care provider. Document Released: 02/24/2009 Document Revised: 10/06/2015 Document Reviewed: 06/04/2014 Elsevier Interactive Patient Education  2017 [employer].

## 2017-12-30 ENCOUNTER — Ambulatory Visit (INDEPENDENT_AMBULATORY_CARE_PROVIDER_SITE_OTHER): Payer: PPO | Admitting: *Deleted

## 2017-12-30 DIAGNOSIS — R55 Syncope and collapse: Secondary | ICD-10-CM | POA: Diagnosis not present

## 2017-12-31 NOTE — Progress Notes (Signed)
Carelink Summary Report / Loop Recorder 

## 2018-01-13 LAB — CUP PACEART REMOTE DEVICE CHECK
Date Time Interrogation Session: 20190715224035
MDC IDC PG IMPLANT DT: 20180705

## 2018-01-14 DIAGNOSIS — L538 Other specified erythematous conditions: Secondary | ICD-10-CM | POA: Diagnosis not present

## 2018-01-14 DIAGNOSIS — D2261 Melanocytic nevi of right upper limb, including shoulder: Secondary | ICD-10-CM | POA: Diagnosis not present

## 2018-01-14 DIAGNOSIS — D2262 Melanocytic nevi of left upper limb, including shoulder: Secondary | ICD-10-CM | POA: Diagnosis not present

## 2018-01-14 DIAGNOSIS — L820 Inflamed seborrheic keratosis: Secondary | ICD-10-CM | POA: Diagnosis not present

## 2018-01-14 DIAGNOSIS — L821 Other seborrheic keratosis: Secondary | ICD-10-CM | POA: Diagnosis not present

## 2018-01-14 DIAGNOSIS — Z85828 Personal history of other malignant neoplasm of skin: Secondary | ICD-10-CM | POA: Diagnosis not present

## 2018-01-14 DIAGNOSIS — Z08 Encounter for follow-up examination after completed treatment for malignant neoplasm: Secondary | ICD-10-CM | POA: Diagnosis not present

## 2018-01-14 DIAGNOSIS — L570 Actinic keratosis: Secondary | ICD-10-CM | POA: Diagnosis not present

## 2018-01-14 DIAGNOSIS — D2271 Melanocytic nevi of right lower limb, including hip: Secondary | ICD-10-CM | POA: Diagnosis not present

## 2018-01-14 DIAGNOSIS — X32XXXA Exposure to sunlight, initial encounter: Secondary | ICD-10-CM | POA: Diagnosis not present

## 2018-01-14 DIAGNOSIS — D225 Melanocytic nevi of trunk: Secondary | ICD-10-CM | POA: Diagnosis not present

## 2018-01-14 DIAGNOSIS — D2272 Melanocytic nevi of left lower limb, including hip: Secondary | ICD-10-CM | POA: Diagnosis not present

## 2018-01-15 DIAGNOSIS — Z23 Encounter for immunization: Secondary | ICD-10-CM | POA: Diagnosis not present

## 2018-01-30 ENCOUNTER — Ambulatory Visit (INDEPENDENT_AMBULATORY_CARE_PROVIDER_SITE_OTHER): Payer: PPO | Admitting: *Deleted

## 2018-01-30 DIAGNOSIS — R55 Syncope and collapse: Secondary | ICD-10-CM

## 2018-01-31 NOTE — Progress Notes (Signed)
Carelink Summary Report / Loop Recorder 

## 2018-02-03 ENCOUNTER — Ambulatory Visit (INDEPENDENT_AMBULATORY_CARE_PROVIDER_SITE_OTHER): Payer: PPO | Admitting: Family Medicine

## 2018-02-03 VITALS — BP 108/70 | HR 60 | Temp 97.5°F | Resp 16 | Wt 211.0 lb

## 2018-02-03 DIAGNOSIS — E11610 Type 2 diabetes mellitus with diabetic neuropathic arthropathy: Secondary | ICD-10-CM

## 2018-02-03 DIAGNOSIS — I482 Chronic atrial fibrillation, unspecified: Secondary | ICD-10-CM

## 2018-02-03 DIAGNOSIS — I10 Essential (primary) hypertension: Secondary | ICD-10-CM | POA: Diagnosis not present

## 2018-02-03 DIAGNOSIS — R972 Elevated prostate specific antigen [PSA]: Secondary | ICD-10-CM | POA: Diagnosis not present

## 2018-02-03 DIAGNOSIS — E7800 Pure hypercholesterolemia, unspecified: Secondary | ICD-10-CM | POA: Diagnosis not present

## 2018-02-03 DIAGNOSIS — D692 Other nonthrombocytopenic purpura: Secondary | ICD-10-CM

## 2018-02-03 LAB — CUP PACEART REMOTE DEVICE CHECK
Implantable Pulse Generator Implant Date: 20180705
MDC IDC SESS DTM: 20190817233949

## 2018-02-03 NOTE — Progress Notes (Signed)
Roberto Leonard  MRN: [id number] DOB: August 15, 1940  Subjective:  HPI   The patient is a 77 year old male who presents for follow up of his diabetes.  He was last seen for this on 09/30/17.  At that tie there were no changes in regiment and his A1C was good at 5.9.  The patient has been seen since that time for an acute visit and an ER visit, both for severe constipation.  CT of the abdomen at the time of the ER visit revealed significant constipation but no impaction or obstruction.  The patient is due for his urine micro-albumin, labs and diabetic foot exam is due in January.  He is being followed by cardiology for A. fib.  Eliquis Patient Active Problem List   Diagnosis Date Noted  . Syncope 11/11/2016  . Memory loss 04/30/2015  . Special screening for malignant neoplasms, colon   . Benign neoplasm of sigmoid colon   . Allergic rhinitis 09/24/2014  . 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 Cheilitis 09/24/2014  . Abnormal prostate specific antigen 09/24/2014  . 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 Essential (primary) hypertension 09/24/2014  . Acid reflux 09/24/2014  . Glaucoma 09/24/2014  . Inguinal hernia 09/24/2014  . Hypercholesteremia 09/24/2014  . Detached retina 09/24/2014  . Type 2 diabetes mellitus with diabetic neuropathic arthropathy ([place]) 09/24/2014    Past Medical History:  Diagnosis Date  . Allergy   . Detached retina    RIGHT  . Diabetes mellitus without complication (HCC)    ORAL MED  . Dupuytren's contracture   . ED (erectile dysfunction)   . GERD (gastroesophageal reflux disease)   . Glaucoma   . Hyperlipidemia   . Hypertension     Social History   Socioeconomic History  . Marital status: Married    Spouse name: Pricilla  . Number of children: 3  . Years of education: college  . Highest education level:  Bachelor's degree (e.g., BA, AB, BS)  Occupational History  . Occupation: Retired  . 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 Financial resource strain: Not hard at all  . Food insecurity:    Worry: Never true    Inability: Never true  . Transportation needs:    Medical: No    Non-medical: No  Tobacco Use  . Smoking status: Former Smoker    Packs/day: 0.50    Years: 2.00    Pack years: 1.00    Types: Cigarettes    Last attempt to quit: 05/14/1963    Years since quitting: 54.7  . Smokeless tobacco: Never Used  Substance and Sexual Activity  . Alcohol use: No    Alcohol/week: 0.0 standard drinks  . Drug use: No  . Sexual activity: Not on file  Lifestyle  . Physical activity:    Days per week: Not on file    Minutes per session: Not on file  . Stress: Not at all  Relationships  . Social connections:    Talks on phone: Not on file    Gets together: Not on file    Attends religious service: Not on file    Active member of club or organization: Not on file    Attends meetings of clubs or organizations: Not on file    Relationship  status: Not on file  . Intimate partner violence:    Fear of current or ex partner: Not on file    Emotionally abused: Not on file    Physically abused: Not on file    Forced sexual activity: Not on file  Other Topics Concern  . Not on file  Social History Narrative  . Not on file    Outpatient Encounter Medications as of 02/03/2018  Medication Sig  . apixaban (ELIQUIS) 5 MG TABS tablet Take 1 tablet (5 mg total) by mouth 2 (two) times daily.  . brimonidine (ALPHAGAN) 0.2 % ophthalmic solution Place 1 drop into both eyes 2 (two) times daily.  . Cetirizine HCl 10 MG CAPS Take 10 mg by mouth daily.   . cholecalciferol (VITAMIN D) 1000 units tablet Take 1,000 Units by mouth daily.  Marland Kitchen diltiazem (TIAZAC) 180 MG 24 hr capsule Take 180 mg daily by mouth.  .  dorzolamide-timolol (COSOPT) 22.3-6.8 MG/ML ophthalmic solution Place 1 drop into both eyes 2 (two) times daily.  Marland Kitchen glucose blood (ONETOUCH VERIO) test strip Test glucose daily and as needed  . losartan (COZAAR) 100 MG tablet TAKE 1 TABLET (100 MG TOTAL) BY MOUTH DAILY IN THE MORNING  . Multiple Vitamin (MULTIVITAMIN WITH MINERALS) TABS tablet Take 1 tablet by mouth daily.  . Omega-3 Fatty Acids (FISH OIL BURP-LESS) 1200 MG CAPS Take 2 capsules by mouth every evening.   Marland Kitchen omeprazole (PRILOSEC) 20 MG capsule TAKE 1 CAPSULE BY MOUTH EVERY DAY  . pioglitazone (ACTOS) 30 MG tablet TAKE 1 TABLET BY MOUTH DAILY IN THE MORNING  . simvastatin (ZOCOR) 10 MG tablet Take 10 mg daily by mouth.  . [DISCONTINUED] neomycin-polymyxin-hydrocortisone (CORTISPORIN) 3.5-10000-1 OTIC suspension Place 3 drops into the right ear 3 (three) times daily. (Patient not taking: Reported on 12/23/2017)   No facility-administered encounter medications on file as of 02/03/2018.     Allergies  Allergen Reactions  . Sulfa Antibiotics Other (See Comments)    SWEATS, bad headache SWEATS, bad headache    Review of Systems  Constitutional: Negative for fever and malaise/fatigue.  Eyes: Negative.   Respiratory: Negative for cough and shortness of breath.   Cardiovascular: Negative for chest pain, palpitations, claudication and leg swelling.  Gastrointestinal: Negative.   Genitourinary: Negative.   Skin: Negative.        He does complain of easy bruising of his forearms and hands.  Neurological: Negative for dizziness and headaches.  Endo/Heme/Allergies: Negative.   Psychiatric/Behavioral: Negative.     Objective:  BP 108/70 (BP Location: Right Arm, Patient Position: Sitting, Cuff Size: Normal)   Pulse 60   Temp (!) 97.5 F (36.4 C) (Oral)   Resp 16   Wt 211 lb (95.7 kg)   BMI 27.09 kg/m   Physical Exam  Constitutional: He is oriented to person, place, and time and well-developed, well-nourished, and in no  distress.  HENT:  Head: Normocephalic and atraumatic.  Right Ear: External ear normal.  Left Ear: External ear normal.  Nose: Nose normal.  Eyes: Conjunctivae are normal. No scleral icterus.  Neck: No thyromegaly present.  Cardiovascular: Normal rate, regular rhythm and normal heart sounds.  Pulmonary/Chest: Effort normal and breath sounds normal.  Musculoskeletal: He exhibits no edema.  Neurological: He is alert and oriented to person, place, and time. Gait normal. GCS score is 15.  Monofilament exam of his feet is normal  Skin: Skin is warm and dry.  Oozing noted only of forearms  With abrasion on right  forearm.  Psychiatric: Mood, memory, affect and judgment normal.    Assessment and Plan :  1. Type 2 diabetes mellitus with diabetic neuropathic arthropathy, without long-term current use of insulin (HCC)  - Hemoglobin A1c - POCT UA - Microalbumin  2. Essential (primary) hypertension  - CBC with Differential/Platelet - Comprehensive metabolic panel - TSH  3. Hypercholesteremia  - Lipid Panel With LDL/HDL Ratio  4. Abnormal prostate specific antigen PSA next visit--done 10 months ago. Was 3.9.  5. Chronic atrial fibrillation ([place]) On eliquis.  6. Senile purpura ([place])  I have done the exam and reviewed the chart and it is accurate to the best of my knowledge. Development worker, community has been used and  any errors in dictation or transcription are unintentional. Miguel Aschoff M.D. [facility]

## 2018-02-06 DIAGNOSIS — E7800 Pure hypercholesterolemia, unspecified: Secondary | ICD-10-CM | POA: Diagnosis not present

## 2018-02-06 DIAGNOSIS — E11610 Type 2 diabetes mellitus with diabetic neuropathic arthropathy: Secondary | ICD-10-CM | POA: Diagnosis not present

## 2018-02-06 DIAGNOSIS — I10 Essential (primary) hypertension: Secondary | ICD-10-CM | POA: Diagnosis not present

## 2018-02-07 LAB — HEMOGLOBIN A1C
Est. average glucose Bld gHb Est-mCnc: 126 mg/dL
HEMOGLOBIN A1C: 6 % — AB (ref 4.8–5.6)

## 2018-02-07 LAB — CBC WITH DIFFERENTIAL/PLATELET
BASOS: 1 %
Basophils Absolute: 0.1 10*3/uL (ref 0.0–0.2)
EOS (ABSOLUTE): 0.1 10*3/uL (ref 0.0–0.4)
EOS: 2 %
HEMOGLOBIN: 15.8 g/dL (ref 13.0–17.7)
Hematocrit: 48.7 % (ref 37.5–51.0)
IMMATURE GRANS (ABS): 0 10*3/uL (ref 0.0–0.1)
IMMATURE GRANULOCYTES: 0 %
LYMPHS: 28 %
Lymphocytes Absolute: 1.6 10*3/uL (ref 0.7–3.1)
MCH: 28.8 pg (ref 26.6–33.0)
MCHC: 32.4 g/dL (ref 31.5–35.7)
MCV: 89 fL (ref 79–97)
MONOCYTES: 10 %
Monocytes Absolute: 0.6 10*3/uL (ref 0.1–0.9)
NEUTROS ABS: 3.3 10*3/uL (ref 1.4–7.0)
NEUTROS PCT: 59 %
Platelets: 176 10*3/uL (ref 150–450)
RBC: 5.49 x10E6/uL (ref 4.14–5.80)
RDW: 12.4 % (ref 12.3–15.4)
WBC: 5.6 10*3/uL (ref 3.4–10.8)

## 2018-02-07 LAB — TSH: TSH: 1.34 u[IU]/mL (ref 0.450–4.500)

## 2018-02-07 LAB — COMPREHENSIVE METABOLIC PANEL
A/G RATIO: 2 (ref 1.2–2.2)
ALBUMIN: 4.5 g/dL (ref 3.5–4.8)
ALT: 48 IU/L — ABNORMAL HIGH (ref 0–44)
AST: 29 IU/L (ref 0–40)
Alkaline Phosphatase: 61 IU/L (ref 39–117)
BUN / CREAT RATIO: 24 (ref 10–24)
BUN: 21 mg/dL (ref 8–27)
Bilirubin Total: 0.6 mg/dL (ref 0.0–1.2)
CALCIUM: 9.5 mg/dL (ref 8.6–10.2)
CO2: 19 mmol/L — ABNORMAL LOW (ref 20–29)
CREATININE: 0.88 mg/dL (ref 0.76–1.27)
Chloride: 105 mmol/L (ref 96–106)
GFR calc Af Amer: 95 mL/min/{1.73_m2} (ref >59)
GFR calc non Af Amer: 82 mL/min/{1.73_m2} (ref >59)
Globulin, Total: 2.2 g/dL (ref 1.5–4.5)
Glucose: 111 mg/dL — ABNORMAL HIGH (ref 65–99)
POTASSIUM: 4.5 mmol/L (ref 3.5–5.2)
SODIUM: 144 mmol/L (ref 134–144)
TOTAL PROTEIN: 6.7 g/dL (ref 6.0–8.5)

## 2018-02-07 LAB — LIPID PANEL WITH LDL/HDL RATIO
Cholesterol, Total: 166 mg/dL (ref 100–199)
HDL: 38 mg/dL — AB (ref >39)
LDL Calculated: 101 mg/dL — ABNORMAL HIGH (ref 0–99)
LDL/HDL RATIO: 2.7 ratio (ref 0.0–3.6)
Triglycerides: 136 mg/dL (ref 0–149)
VLDL CHOLESTEROL CAL: 27 mg/dL (ref 5–40)

## 2018-02-10 LAB — CUP PACEART REMOTE DEVICE CHECK
Date Time Interrogation Session: 20190920001121
MDC IDC PG IMPLANT DT: 20180705

## 2018-02-13 ENCOUNTER — Telehealth: Payer: Self-pay

## 2018-02-13 NOTE — Telephone Encounter (Signed)
Patient advised, He states his cardiologist started him on Diltiazem recently and changed Simvastatin from 20 mg to 10 mg. Patient reports that he was advised the medication can have a adverse reaction if taken with cholesterol medication. He wants to know if it would be safe to start Rosuvastatin 20 mg along with Diltiazem. Please advise.

## 2018-02-13 NOTE — Telephone Encounter (Signed)
Yes--just cut crestor to 10--stronger than simvistatin.

## 2018-02-13 NOTE — Telephone Encounter (Signed)
-----   Message from [name]., MD sent at 02/13/2018  1:27 PM EDT ----- Labs okay but cholesterol up a little.  Would stop simvastatin and start rosuvastatin 20 mg daily.  Clinic 1 month

## 2018-02-13 NOTE — Telephone Encounter (Signed)
Patient advised. He prefers to discuss with Dr. Saralyn Pilar on 10/19 before switching medications. He states he will call back after that appointment to advise whether he would like to switch to Crestor.

## 2018-02-16 DIAGNOSIS — J019 Acute sinusitis, unspecified: Secondary | ICD-10-CM | POA: Diagnosis not present

## 2018-02-16 DIAGNOSIS — J069 Acute upper respiratory infection, unspecified: Secondary | ICD-10-CM | POA: Diagnosis not present

## 2018-02-16 DIAGNOSIS — H1033 Unspecified acute conjunctivitis, bilateral: Secondary | ICD-10-CM | POA: Diagnosis not present

## 2018-02-28 ENCOUNTER — Telehealth: Payer: Self-pay | Admitting: Family Medicine

## 2018-02-28 DIAGNOSIS — I480 Paroxysmal atrial fibrillation: Secondary | ICD-10-CM | POA: Diagnosis not present

## 2018-02-28 DIAGNOSIS — R001 Bradycardia, unspecified: Secondary | ICD-10-CM | POA: Diagnosis not present

## 2018-02-28 DIAGNOSIS — R55 Syncope and collapse: Secondary | ICD-10-CM | POA: Diagnosis not present

## 2018-02-28 DIAGNOSIS — I4510 Unspecified right bundle-branch block: Secondary | ICD-10-CM | POA: Diagnosis not present

## 2018-02-28 NOTE — Telephone Encounter (Signed)
Patient came by to tell you that he seen Dr. Loney Laurence today and he is increasing the Simvastatin to 20 mg. Because his cholesterol was elevated.  Patient wants to know if he needs labs done in a month to check cholesterol?

## 2018-03-03 ENCOUNTER — Other Ambulatory Visit: Payer: Self-pay

## 2018-03-03 DIAGNOSIS — E7800 Pure hypercholesterolemia, unspecified: Secondary | ICD-10-CM

## 2018-03-03 NOTE — Telephone Encounter (Signed)
Advised we could check it in a month.  Order placed

## 2018-03-04 ENCOUNTER — Ambulatory Visit (INDEPENDENT_AMBULATORY_CARE_PROVIDER_SITE_OTHER): Payer: PPO | Admitting: *Deleted

## 2018-03-04 DIAGNOSIS — R55 Syncope and collapse: Secondary | ICD-10-CM

## 2018-03-05 NOTE — Progress Notes (Signed)
Carelink Summary Report / Loop Recorder 

## 2018-03-27 LAB — CUP PACEART REMOTE DEVICE CHECK
Date Time Interrogation Session: 20191023000720
MDC IDC PG IMPLANT DT: 20180705

## 2018-04-01 ENCOUNTER — Ambulatory Visit (INDEPENDENT_AMBULATORY_CARE_PROVIDER_SITE_OTHER): Payer: PPO | Admitting: Family Medicine

## 2018-04-01 ENCOUNTER — Encounter: Payer: Self-pay | Admitting: Family Medicine

## 2018-04-01 VITALS — BP 110/56 | HR 59 | Temp 97.6°F | Resp 15 | Wt 208.4 lb

## 2018-04-01 DIAGNOSIS — L03032 Cellulitis of left toe: Secondary | ICD-10-CM

## 2018-04-01 MED ORDER — CEPHALEXIN 500 MG PO CAPS: 500.0000 mg | ORAL_CAPSULE | Freq: Two times a day (BID) | ORAL | 0 refills | Status: DC

## 2018-04-01 NOTE — Patient Instructions (Signed)
Soak left great toe in warm salt water (Epsom salts) for 10 minutes daily. If you see a pus pocket come in and we will open it up.

## 2018-04-01 NOTE — Progress Notes (Signed)
  Subjective:     Patient ID: Roberto Leonard, male   DOB: 03-11-1941, 77 y.o.   MRN: [id number] Chief Complaint  Patient presents with  . Toe Pain    Patient comes in office today with concerns of a possible infected toe. Patient states that a week ago he had got a pedicure and states the next day he began to have swelling and redness on his great foot on left side.    HPI Has been using peroxide with mild  Improvement. Hx of diabetes.  Review of Systems     Objective:   Physical Exam  Constitutional: He appears well-developed. No distress.  Skin:  Left great toe with early paronychia-no pus pocket or drainage noted       Assessment:    1. Paronychia of great toe, left: start cephalexin    Plan:    Discussed warm salt water soaks daily. Return for I & D if pus pocket noted.

## 2018-04-07 ENCOUNTER — Ambulatory Visit (INDEPENDENT_AMBULATORY_CARE_PROVIDER_SITE_OTHER): Payer: PPO

## 2018-04-07 DIAGNOSIS — R55 Syncope and collapse: Secondary | ICD-10-CM

## 2018-04-07 DIAGNOSIS — H40003 Preglaucoma, unspecified, bilateral: Secondary | ICD-10-CM | POA: Diagnosis not present

## 2018-04-07 NOTE — Progress Notes (Signed)
Carelink Summary Report / Loop Recorder 

## 2018-04-09 DIAGNOSIS — E119 Type 2 diabetes mellitus without complications: Secondary | ICD-10-CM | POA: Diagnosis not present

## 2018-04-09 LAB — HM DIABETES EYE EXAM

## 2018-04-16 DIAGNOSIS — E7800 Pure hypercholesterolemia, unspecified: Secondary | ICD-10-CM | POA: Diagnosis not present

## 2018-04-17 LAB — COMPREHENSIVE METABOLIC PANEL
ALK PHOS: 66 IU/L (ref 39–117)
ALT: 40 IU/L (ref 0–44)
AST: 28 IU/L (ref 0–40)
Albumin/Globulin Ratio: 2 (ref 1.2–2.2)
Albumin: 4.3 g/dL (ref 3.5–4.8)
BILIRUBIN TOTAL: 0.4 mg/dL (ref 0.0–1.2)
BUN/Creatinine Ratio: 20 (ref 10–24)
BUN: 17 mg/dL (ref 8–27)
CHLORIDE: 105 mmol/L (ref 96–106)
CO2: 21 mmol/L (ref 20–29)
CREATININE: 0.87 mg/dL (ref 0.76–1.27)
Calcium: 9.2 mg/dL (ref 8.6–10.2)
GFR calc Af Amer: 96 mL/min/{1.73_m2} (ref >59)
GFR calc non Af Amer: 83 mL/min/{1.73_m2} (ref >59)
GLUCOSE: 110 mg/dL — AB (ref 65–99)
Globulin, Total: 2.1 g/dL (ref 1.5–4.5)
Potassium: 4.6 mmol/L (ref 3.5–5.2)
SODIUM: 141 mmol/L (ref 134–144)
Total Protein: 6.4 g/dL (ref 6.0–8.5)

## 2018-04-17 LAB — LIPID PANEL WITH LDL/HDL RATIO
CHOLESTEROL TOTAL: 171 mg/dL (ref 100–199)
HDL: 42 mg/dL (ref >39)
LDL CALC: 104 mg/dL — AB (ref 0–99)
LDl/HDL Ratio: 2.5 ratio (ref 0.0–3.6)
TRIGLYCERIDES: 126 mg/dL (ref 0–149)
VLDL CHOLESTEROL CAL: 25 mg/dL (ref 5–40)

## 2018-04-19 ENCOUNTER — Other Ambulatory Visit: Payer: Self-pay | Admitting: Family Medicine

## 2018-04-21 ENCOUNTER — Telehealth: Payer: Self-pay

## 2018-04-21 NOTE — Telephone Encounter (Signed)
Patient advised as directed below.  Thanks,  -Azora Bonzo 

## 2018-04-21 NOTE — Telephone Encounter (Signed)
-----   Message from [name]., MD sent at 04/21/2018  8:18 AM EST ----- Labs ok.

## 2018-05-09 ENCOUNTER — Ambulatory Visit (INDEPENDENT_AMBULATORY_CARE_PROVIDER_SITE_OTHER): Payer: PPO

## 2018-05-09 DIAGNOSIS — R55 Syncope and collapse: Secondary | ICD-10-CM | POA: Diagnosis not present

## 2018-05-11 LAB — CUP PACEART REMOTE DEVICE CHECK
Implantable Pulse Generator Implant Date: 20180705
MDC IDC SESS DTM: 20191228010840

## 2018-05-12 NOTE — Progress Notes (Signed)
Carelink Summary Report / Loop Recorder 

## 2018-05-25 LAB — CUP PACEART REMOTE DEVICE CHECK
Date Time Interrogation Session: 20191125004104
MDC IDC PG IMPLANT DT: 20180705

## 2018-05-27 ENCOUNTER — Other Ambulatory Visit: Payer: Self-pay | Admitting: Family Medicine

## 2018-05-29 DIAGNOSIS — Z961 Presence of intraocular lens: Secondary | ICD-10-CM | POA: Diagnosis not present

## 2018-05-29 DIAGNOSIS — H25812 Combined forms of age-related cataract, left eye: Secondary | ICD-10-CM | POA: Diagnosis not present

## 2018-05-29 DIAGNOSIS — E119 Type 2 diabetes mellitus without complications: Secondary | ICD-10-CM | POA: Diagnosis not present

## 2018-06-11 ENCOUNTER — Ambulatory Visit (INDEPENDENT_AMBULATORY_CARE_PROVIDER_SITE_OTHER): Payer: PPO

## 2018-06-11 DIAGNOSIS — R55 Syncope and collapse: Secondary | ICD-10-CM | POA: Diagnosis not present

## 2018-06-12 NOTE — Progress Notes (Signed)
Carelink Summary Report / Loop Recorder 

## 2018-06-13 LAB — CUP PACEART REMOTE DEVICE CHECK
Date Time Interrogation Session: 20200130010604
MDC IDC PG IMPLANT DT: 20180705

## 2018-06-16 DIAGNOSIS — R001 Bradycardia, unspecified: Secondary | ICD-10-CM | POA: Diagnosis not present

## 2018-06-16 DIAGNOSIS — H25812 Combined forms of age-related cataract, left eye: Secondary | ICD-10-CM | POA: Diagnosis not present

## 2018-06-16 DIAGNOSIS — K219 Gastro-esophageal reflux disease without esophagitis: Secondary | ICD-10-CM | POA: Diagnosis not present

## 2018-06-16 DIAGNOSIS — I480 Paroxysmal atrial fibrillation: Secondary | ICD-10-CM | POA: Diagnosis not present

## 2018-06-16 DIAGNOSIS — R55 Syncope and collapse: Secondary | ICD-10-CM | POA: Diagnosis not present

## 2018-06-16 DIAGNOSIS — Z01818 Encounter for other preprocedural examination: Secondary | ICD-10-CM | POA: Diagnosis not present

## 2018-06-16 DIAGNOSIS — I4510 Unspecified right bundle-branch block: Secondary | ICD-10-CM | POA: Diagnosis not present

## 2018-06-16 DIAGNOSIS — E119 Type 2 diabetes mellitus without complications: Secondary | ICD-10-CM | POA: Diagnosis not present

## 2018-06-16 DIAGNOSIS — I4891 Unspecified atrial fibrillation: Secondary | ICD-10-CM | POA: Diagnosis not present

## 2018-06-16 DIAGNOSIS — I10 Essential (primary) hypertension: Secondary | ICD-10-CM | POA: Diagnosis not present

## 2018-06-23 DIAGNOSIS — Z961 Presence of intraocular lens: Secondary | ICD-10-CM | POA: Insufficient documentation

## 2018-06-23 DIAGNOSIS — I10 Essential (primary) hypertension: Secondary | ICD-10-CM | POA: Diagnosis not present

## 2018-06-23 DIAGNOSIS — H25812 Combined forms of age-related cataract, left eye: Secondary | ICD-10-CM | POA: Diagnosis not present

## 2018-06-23 DIAGNOSIS — I480 Paroxysmal atrial fibrillation: Secondary | ICD-10-CM | POA: Diagnosis not present

## 2018-06-23 DIAGNOSIS — H52222 Regular astigmatism, left eye: Secondary | ICD-10-CM | POA: Diagnosis not present

## 2018-06-23 DIAGNOSIS — Z7901 Long term (current) use of anticoagulants: Secondary | ICD-10-CM | POA: Diagnosis not present

## 2018-06-23 DIAGNOSIS — Z87891 Personal history of nicotine dependence: Secondary | ICD-10-CM | POA: Diagnosis not present

## 2018-06-23 DIAGNOSIS — N401 Enlarged prostate with lower urinary tract symptoms: Secondary | ICD-10-CM | POA: Diagnosis not present

## 2018-06-23 DIAGNOSIS — Z9841 Cataract extraction status, right eye: Secondary | ICD-10-CM | POA: Diagnosis not present

## 2018-06-23 DIAGNOSIS — R351 Nocturia: Secondary | ICD-10-CM | POA: Diagnosis not present

## 2018-06-23 DIAGNOSIS — Z7984 Long term (current) use of oral hypoglycemic drugs: Secondary | ICD-10-CM | POA: Diagnosis not present

## 2018-06-23 DIAGNOSIS — E119 Type 2 diabetes mellitus without complications: Secondary | ICD-10-CM | POA: Diagnosis not present

## 2018-06-23 DIAGNOSIS — E1136 Type 2 diabetes mellitus with diabetic cataract: Secondary | ICD-10-CM | POA: Diagnosis not present

## 2018-06-23 DIAGNOSIS — K219 Gastro-esophageal reflux disease without esophagitis: Secondary | ICD-10-CM | POA: Diagnosis not present

## 2018-07-14 ENCOUNTER — Ambulatory Visit (INDEPENDENT_AMBULATORY_CARE_PROVIDER_SITE_OTHER): Payer: PPO | Admitting: *Deleted

## 2018-07-14 DIAGNOSIS — R55 Syncope and collapse: Secondary | ICD-10-CM | POA: Diagnosis not present

## 2018-07-15 LAB — CUP PACEART REMOTE DEVICE CHECK
Date Time Interrogation Session: 20200303010741
MDC IDC PG IMPLANT DT: 20180705

## 2018-07-21 NOTE — Progress Notes (Signed)
Carelink Summary Report / Loop Recorder 

## 2018-07-22 DIAGNOSIS — Z961 Presence of intraocular lens: Secondary | ICD-10-CM | POA: Diagnosis not present

## 2018-08-05 ENCOUNTER — Ambulatory Visit: Payer: Self-pay | Admitting: Family Medicine

## 2018-08-12 ENCOUNTER — Ambulatory Visit: Payer: Self-pay | Admitting: Family Medicine

## 2018-08-18 ENCOUNTER — Ambulatory Visit (INDEPENDENT_AMBULATORY_CARE_PROVIDER_SITE_OTHER): Payer: PPO | Admitting: *Deleted

## 2018-08-18 ENCOUNTER — Other Ambulatory Visit: Payer: Self-pay

## 2018-08-18 DIAGNOSIS — R55 Syncope and collapse: Secondary | ICD-10-CM

## 2018-08-18 DIAGNOSIS — L03012 Cellulitis of left finger: Secondary | ICD-10-CM | POA: Diagnosis not present

## 2018-08-19 LAB — CUP PACEART REMOTE DEVICE CHECK
Date Time Interrogation Session: 20200406163123
Implantable Pulse Generator Implant Date: 20180705

## 2018-08-26 NOTE — Progress Notes (Signed)
Carelink Summary Report / Loop Recorder 

## 2018-09-01 ENCOUNTER — Other Ambulatory Visit: Payer: Self-pay | Admitting: Family Medicine

## 2018-09-10 DIAGNOSIS — R55 Syncope and collapse: Secondary | ICD-10-CM | POA: Diagnosis not present

## 2018-09-10 DIAGNOSIS — I4510 Unspecified right bundle-branch block: Secondary | ICD-10-CM | POA: Diagnosis not present

## 2018-09-10 DIAGNOSIS — E119 Type 2 diabetes mellitus without complications: Secondary | ICD-10-CM | POA: Diagnosis not present

## 2018-09-10 DIAGNOSIS — I10 Essential (primary) hypertension: Secondary | ICD-10-CM | POA: Diagnosis not present

## 2018-09-10 DIAGNOSIS — R001 Bradycardia, unspecified: Secondary | ICD-10-CM | POA: Diagnosis not present

## 2018-09-10 DIAGNOSIS — I480 Paroxysmal atrial fibrillation: Secondary | ICD-10-CM | POA: Diagnosis not present

## 2018-09-16 ENCOUNTER — Encounter: Payer: Self-pay | Admitting: Family Medicine

## 2018-09-16 ENCOUNTER — Other Ambulatory Visit: Payer: Self-pay

## 2018-09-16 ENCOUNTER — Ambulatory Visit (INDEPENDENT_AMBULATORY_CARE_PROVIDER_SITE_OTHER): Payer: PPO | Admitting: Family Medicine

## 2018-09-16 VITALS — BP 116/76 | HR 55 | Temp 97.9°F | Wt 214.0 lb

## 2018-09-16 DIAGNOSIS — S51832A Puncture wound without foreign body of left forearm, initial encounter: Secondary | ICD-10-CM

## 2018-09-16 DIAGNOSIS — W450XXA Nail entering through skin, initial encounter: Secondary | ICD-10-CM | POA: Diagnosis not present

## 2018-09-16 DIAGNOSIS — I10 Essential (primary) hypertension: Secondary | ICD-10-CM

## 2018-09-16 DIAGNOSIS — R972 Elevated prostate specific antigen [PSA]: Secondary | ICD-10-CM | POA: Diagnosis not present

## 2018-09-16 DIAGNOSIS — E11610 Type 2 diabetes mellitus with diabetic neuropathic arthropathy: Secondary | ICD-10-CM

## 2018-09-16 DIAGNOSIS — Z23 Encounter for immunization: Secondary | ICD-10-CM | POA: Diagnosis not present

## 2018-09-16 DIAGNOSIS — E7800 Pure hypercholesterolemia, unspecified: Secondary | ICD-10-CM | POA: Diagnosis not present

## 2018-09-16 LAB — POCT GLYCOSYLATED HEMOGLOBIN (HGB A1C)
Est. average glucose Bld gHb Est-mCnc: 126
Hemoglobin A1C: 6 % — AB (ref 4.0–5.6)

## 2018-09-16 MED ORDER — SIMVASTATIN 20 MG PO TABS: 20.0000 mg | ORAL_TABLET | Freq: Every day | ORAL | 3 refills | Status: DC

## 2018-09-16 NOTE — Progress Notes (Signed)
Patient: Roberto Leonard Male    DOB: [birth year]   78 y.o.   MRN: [id number] Visit Date: 09/16/2018  Today's Provider: [name], MD   Chief Complaint  Patient presents with  . Diabetes   Subjective:    HPI  Diabetes Mellitus Type II, Follow-up:   Lab Results  Component Value Date   HGBA1C 6.0 (H) 02/06/2018   HGBA1C 5.9 (A) 09/30/2017   HGBA1C 5.8 (H) 03/29/2017    Last seen for diabetes 6 months ago.  Management since then includes no changes. He reports good compliance with treatment. He is not having side effects.  Current symptoms include none and have been stable. Home blood sugar records: 122 two days ago  Episodes of hypoglycemia? no   Current Insulin Regimen: n/a Most Recent Eye Exam: 05/2016 Weight trend: stable Prior visit with dietician: No Current exercise: walking Current diet habits: well balanced  Pertinent Labs:    Component Value Date/Time   CHOL 171 04/16/2018 0845   TRIG 126 04/16/2018 0845   HDL 42 04/16/2018 0845   LDLCALC 104 (H) 04/16/2018 0845   LDLCALC 91 03/29/2017 0808   CREATININE 0.87 04/16/2018 0845    Wt Readings from Last 3 Encounters:  09/16/18 214 lb (97.1 kg)  04/01/18 208 lb 6.4 oz (94.5 kg)  02/03/18 211 lb (95.7 kg)    ------------------------------------------------------------------------    Allergies  Allergen Reactions  . Sulfa Antibiotics Other (See Comments)    SWEATS, bad headache SWEATS, bad headache     Current Outpatient Medications:  .  apixaban (ELIQUIS) 5 MG TABS tablet, Take 1 tablet (5 mg total) by mouth 2 (two) times daily., Disp: 180 tablet, Rfl: 3 .  brimonidine (ALPHAGAN) 0.2 % ophthalmic solution, Place 1 drop into both eyes 2 (two) times daily., Disp: , Rfl: 3 .  Cetirizine HCl 10 MG CAPS, Take 10 mg by mouth daily. , Disp: , Rfl:  .  cholecalciferol (VITAMIN D) 1000 units tablet, Take 1,000 Units by mouth daily., Disp: , Rfl:  .  diltiazem (TIAZAC) 180 MG 24 hr  capsule, Take 180 mg daily by mouth., Disp: , Rfl:  .  dorzolamide-timolol (COSOPT) 22.3-6.8 MG/ML ophthalmic solution, Place 1 drop into both eyes 2 (two) times daily., Disp: , Rfl:  .  glucose blood test strip, Use with meter to test daily and as needfed, Disp: 100 each, Rfl: 12 .  losartan (COZAAR) 100 MG tablet, TAKE 1 TABLET (100 MG TOTAL) BY MOUTH DAILY IN THE MORNING, Disp: 90 tablet, Rfl: 3 .  Multiple Vitamin (MULTIVITAMIN WITH MINERALS) TABS tablet, Take 1 tablet by mouth daily., Disp: , Rfl:  .  Omega-3 Fatty Acids (FISH OIL BURP-LESS) 1200 MG CAPS, Take 2 capsules by mouth every evening. , Disp: , Rfl:  .  omeprazole (PRILOSEC) 20 MG capsule, TAKE 1 CAPSULE BY MOUTH EVERY DAY, Disp: 90 capsule, Rfl: 2 .  pioglitazone (ACTOS) 30 MG tablet, TAKE 1 TABLET BY MOUTH DAILY IN THE MORNING, Disp: 90 tablet, Rfl: 3 .  simvastatin (ZOCOR) 10 MG tablet, Take 10 mg daily by mouth., Disp: , Rfl:   Review of Systems  Constitutional: Negative.   HENT: Negative.   Eyes: Negative.   Respiratory: Negative.   Cardiovascular: Negative.   Gastrointestinal: Negative.   Endocrine: Negative.   Musculoskeletal: Negative.   Allergic/Immunologic: Negative.   Psychiatric/Behavioral: Negative.     Social History   Tobacco Use  . Smoking status: Former Smoker  Packs/day: 0.50    Years: 2.00    Pack years: 1.00    Types: Cigarettes    Last attempt to quit: 05/14/1963    Years since quitting: 55.3  . Smokeless tobacco: Never Used  Substance Use Topics  . Alcohol use: No    Alcohol/week: 0.0 standard drinks      Objective:   BP 116/76 (BP Location: Left Arm, Patient Position: Sitting, Cuff Size: Normal)   Pulse (!) 55   Temp 97.9 F (36.6 C) (Oral)   Wt 214 lb (97.1 kg)   SpO2 96%   BMI 27.48 kg/m  Vitals:   09/16/18 0827  BP: 116/76  Pulse: (!) 55  Temp: 97.9 F (36.6 C)  TempSrc: Oral  SpO2: 96%  Weight: 214 lb (97.1 kg)     Physical Exam Vitals signs reviewed.   Constitutional:      Appearance: Normal appearance.  HENT:     Head: Normocephalic and atraumatic.     Right Ear: External ear normal.     Left Ear: External ear normal.     Nose: Nose normal.     Mouth/Throat:     Pharynx: Oropharynx is clear.  Eyes:     General: No scleral icterus.    Conjunctiva/sclera: Conjunctivae normal.  Cardiovascular:     Rate and Rhythm: Normal rate and regular rhythm.     Pulses: Normal pulses.     Heart sounds: Normal heart sounds.  Pulmonary:     Effort: Pulmonary effort is normal.     Breath sounds: Normal breath sounds.  Abdominal:     Palpations: Abdomen is soft.  Lymphadenopathy:     Cervical: No cervical adenopathy.  Skin:    General: Skin is warm and dry.  Neurological:     Mental Status: He is alert and oriented to person, place, and time. Mental status is at baseline.  Psychiatric:        Mood and Affect: Mood normal.        Behavior: Behavior normal.        Thought Content: Thought content normal.        Judgment: Judgment normal.         Assessment & Plan     1. Type 2 diabetes mellitus with diabetic neuropathic arthropathy, without long-term current use of insulin (HCC) 6.0 today - POCT glycosylated hemoglobin (Hb A1C)  2. Injury by nail, initial encounter Healed nicely - Td vaccine greater than or equal to 7yo preservative free IM  3. Elevated PSA Followed bu urology - PSA  4. Essential (primary) hypertension Controlled Losartan,diltiazem.  5. Hypercholesteremia On zocor--consider Crestor. 6.Afib  I, Porsha McClurkin CMA, am acting as a scribe for Miguel Aschoff, Brooke Bonito, MD.      [name], MD  [facility]

## 2018-09-17 ENCOUNTER — Telehealth: Payer: Self-pay

## 2018-09-17 LAB — PSA: Prostate Specific Ag, Serum: 4.7 ng/mL — ABNORMAL HIGH (ref 0.0–4.0)

## 2018-09-17 NOTE — Telephone Encounter (Signed)
-----   Message from [name]., MD sent at 09/17/2018  8:26 AM EDT ----- Stable.

## 2018-09-17 NOTE — Telephone Encounter (Signed)
Pt advised.   Thanks,   -Vonnie Spagnolo  

## 2018-09-18 ENCOUNTER — Other Ambulatory Visit: Payer: Self-pay

## 2018-09-18 ENCOUNTER — Ambulatory Visit (INDEPENDENT_AMBULATORY_CARE_PROVIDER_SITE_OTHER): Payer: PPO | Admitting: *Deleted

## 2018-09-18 DIAGNOSIS — R55 Syncope and collapse: Secondary | ICD-10-CM

## 2018-09-19 LAB — CUP PACEART REMOTE DEVICE CHECK
Date Time Interrogation Session: 20200508020521
Implantable Pulse Generator Implant Date: 20180705

## 2018-09-23 NOTE — Progress Notes (Signed)
Carelink Summary Report / Loop Recorder 

## 2018-10-08 DIAGNOSIS — H40003 Preglaucoma, unspecified, bilateral: Secondary | ICD-10-CM | POA: Diagnosis not present

## 2018-10-21 ENCOUNTER — Ambulatory Visit (INDEPENDENT_AMBULATORY_CARE_PROVIDER_SITE_OTHER): Payer: PPO | Admitting: *Deleted

## 2018-10-21 DIAGNOSIS — R55 Syncope and collapse: Secondary | ICD-10-CM | POA: Diagnosis not present

## 2018-10-21 DIAGNOSIS — I480 Paroxysmal atrial fibrillation: Secondary | ICD-10-CM

## 2018-10-22 LAB — CUP PACEART REMOTE DEVICE CHECK
Date Time Interrogation Session: 20200610034007
Implantable Pulse Generator Implant Date: 20180705

## 2018-10-30 NOTE — Progress Notes (Signed)
Carelink Summary Report / Loop Recorder 

## 2018-10-31 ENCOUNTER — Other Ambulatory Visit: Payer: Self-pay | Admitting: Family Medicine

## 2018-10-31 DIAGNOSIS — E11610 Type 2 diabetes mellitus with diabetic neuropathic arthropathy: Secondary | ICD-10-CM

## 2018-11-03 ENCOUNTER — Telehealth: Payer: Self-pay | Admitting: Family Medicine

## 2018-11-03 NOTE — Chronic Care Management (AMB) (Signed)
Chronic Care Management   Note  11/03/2018 Name: Roberto Leonard MRN: [id number] DOB: 1941-04-25  Roberto Leonard is a 78 y.o. year old male who is a primary care patient of [name]., MD. I reached out to Al Corpus by phone today in response to a referral sent by [name]'s health plan.    [name] was given information about Chronic Care Management services today including:  1. CCM service includes personalized support from designated clinical staff supervised by his physician, including individualized plan of care and coordination with other care providers 2. 24/7 contact phone numbers for assistance for urgent and routine care needs. 3. Service will only be billed when office clinical staff spend 20 minutes or more in a month to coordinate care. 4. Only one practitioner may furnish and bill the service in a calendar month. 5. The patient may stop CCM services at any time (effective at the end of the month) by phone call to the office staff. 6. The patient will be responsible for cost sharing (co-pay) of up to 20% of the service fee (after annual deductible is met).  Patient did not agree to enrollment in care management services and does not wish to consider at this time.  Follow up plan: The patient has been provided with contact information for the chronic care management team and has been advised to call with any health related questions or concerns.   [place]  [email or web address]   ??[phone]

## 2018-11-06 ENCOUNTER — Other Ambulatory Visit: Payer: Self-pay | Admitting: Family Medicine

## 2018-11-24 ENCOUNTER — Ambulatory Visit (INDEPENDENT_AMBULATORY_CARE_PROVIDER_SITE_OTHER): Payer: PPO | Admitting: *Deleted

## 2018-11-24 DIAGNOSIS — R55 Syncope and collapse: Secondary | ICD-10-CM

## 2018-11-24 LAB — CUP PACEART REMOTE DEVICE CHECK
Date Time Interrogation Session: 20200713104217
Implantable Pulse Generator Implant Date: 20180705

## 2018-11-27 IMAGING — CT CT HEAD W/O CM
3 series · 16 of 47 positions shown, 19 images · non-contrast
Comparison: 04/30/2015

CLINICAL DATA: Syncope, loss of consciousness at church today, felt
hot and passed out, no injury, history hypertension, diabetes
mellitus, former smoker

EXAM:
CT HEAD WITHOUT CONTRAST
TECHNIQUE: Contiguous axial images were obtained from the base of the skull
through the vertex without intravenous contrast. Sagittal and
coronal MPR images reconstructed from axial data set.

[Series 2: head wo · axial · 0.45mm/px · z∈[-165,-25]mm · 10 of 34 slices shown, 13 images]
[im 3/34  brain]
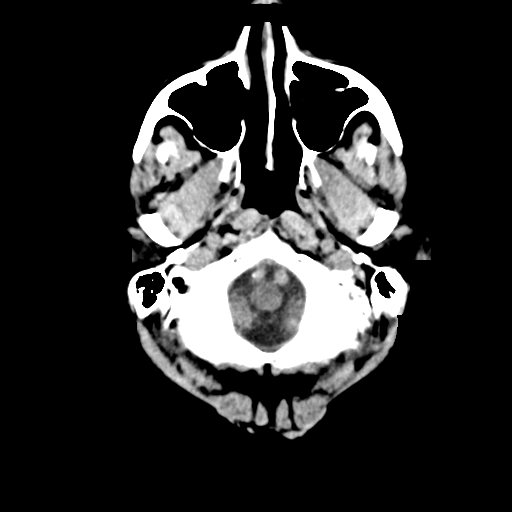
[im 3/34  bone]
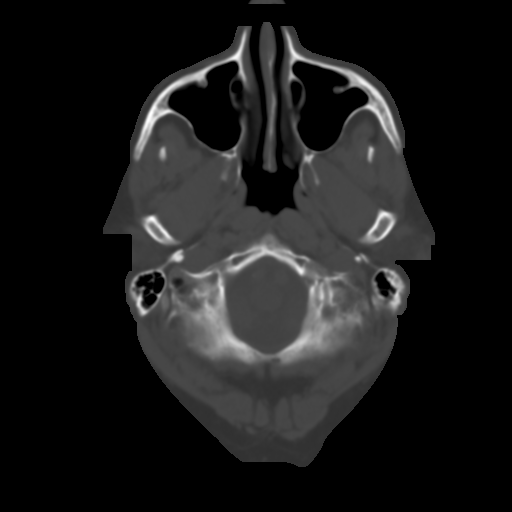
[im 6/34  brain]
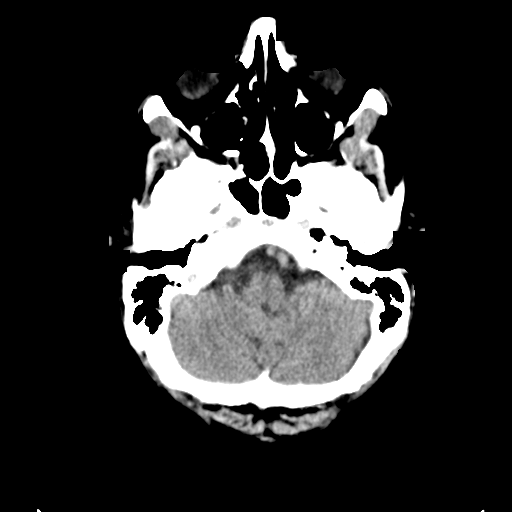
[im 10/34  brain]
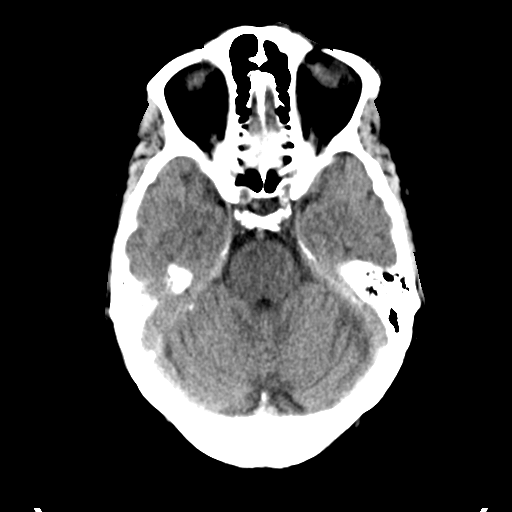
[im 12/34  brain]
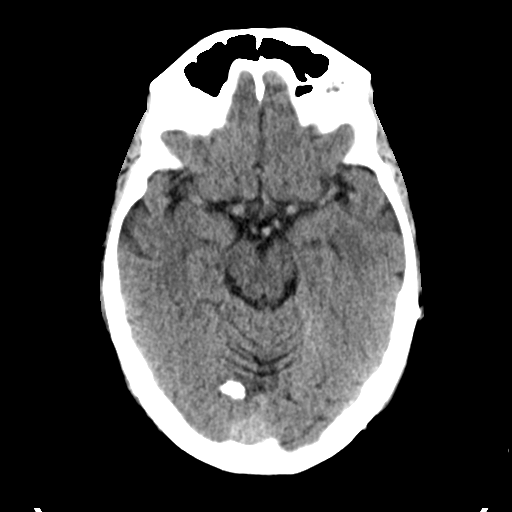
[im 15/34  brain]
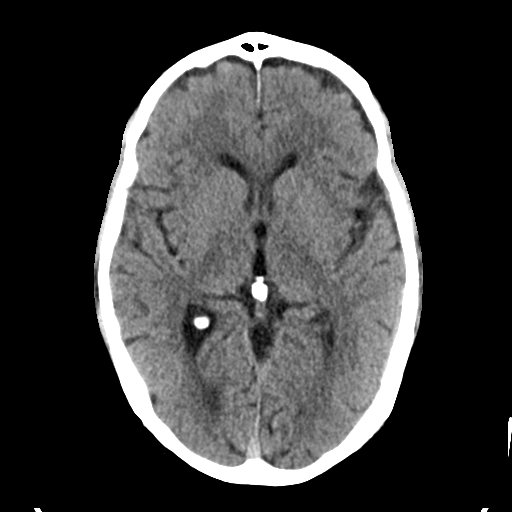
[im 15/34  bone]
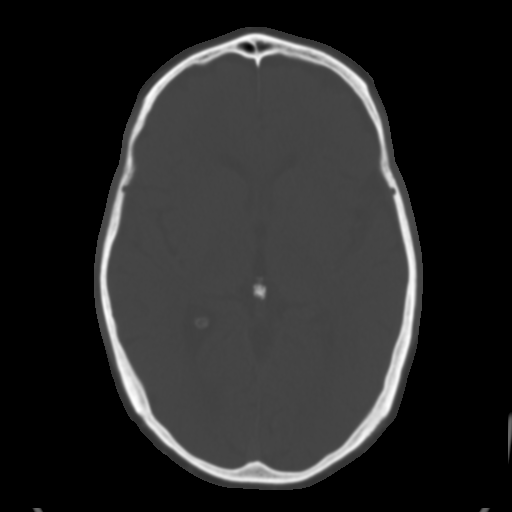
[im 19/34  brain]
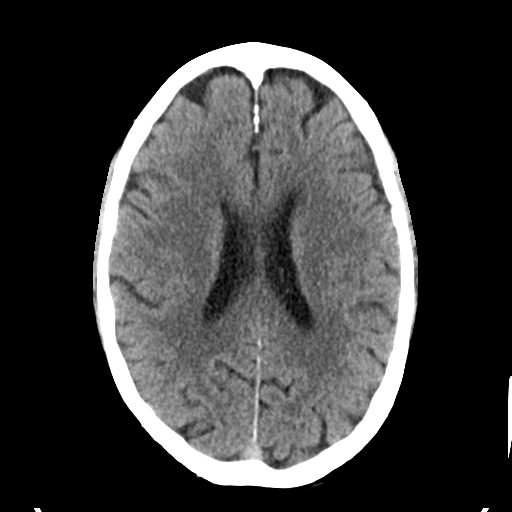
[im 22/34  brain]
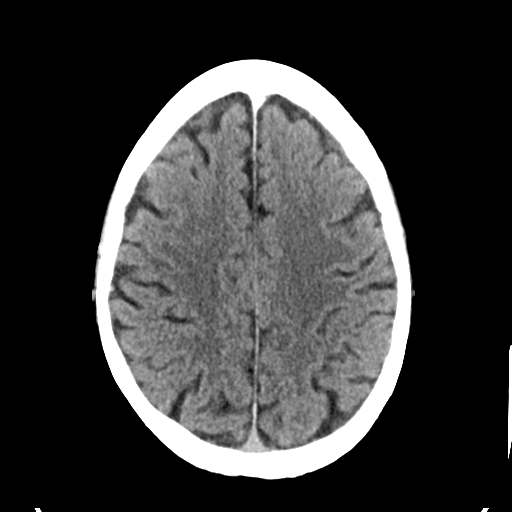
[im 26/34  brain]
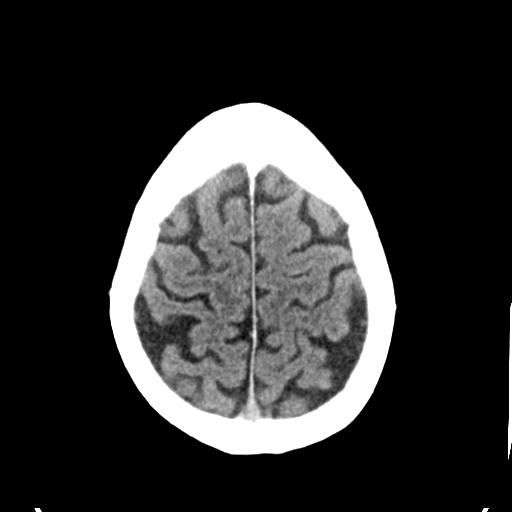
[im 28/34  brain]
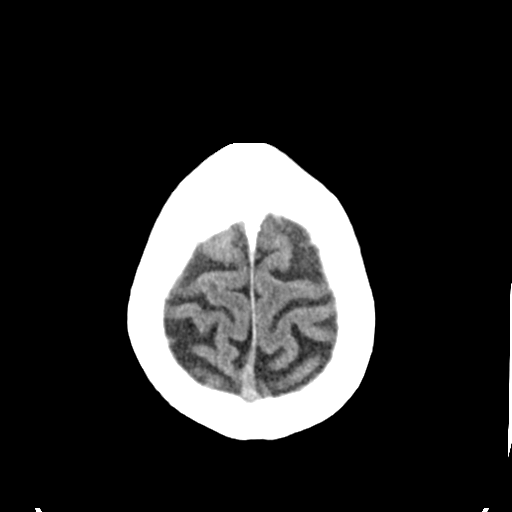
[im 28/34  bone]
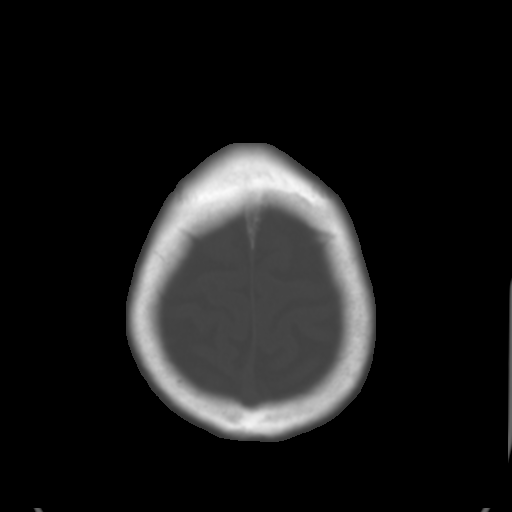
[im 31/34  brain]
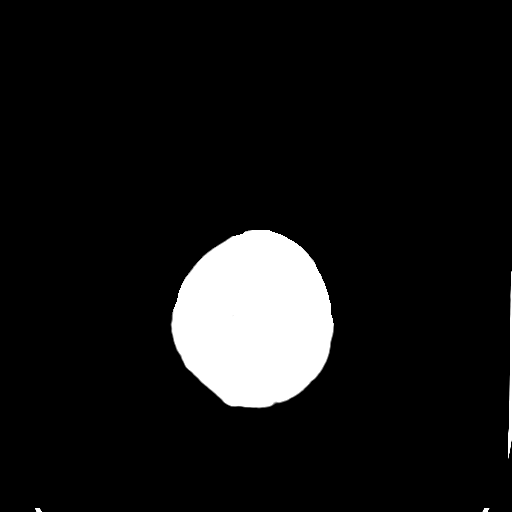

[Series 4: coronal soft tissue · coronal · 0.40mm/px · 3 of 71 slices shown]
[im 24/71  brain]
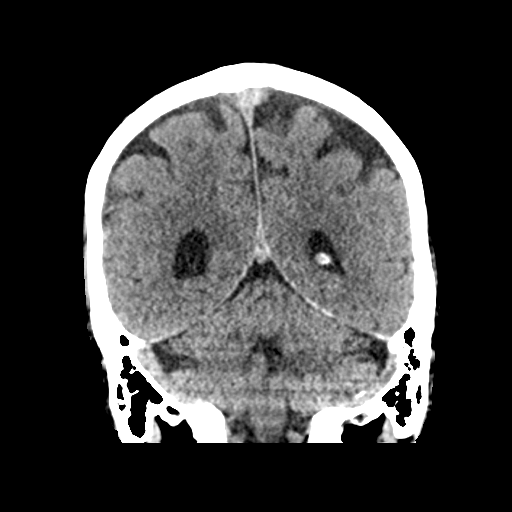
[im 32/71  brain]
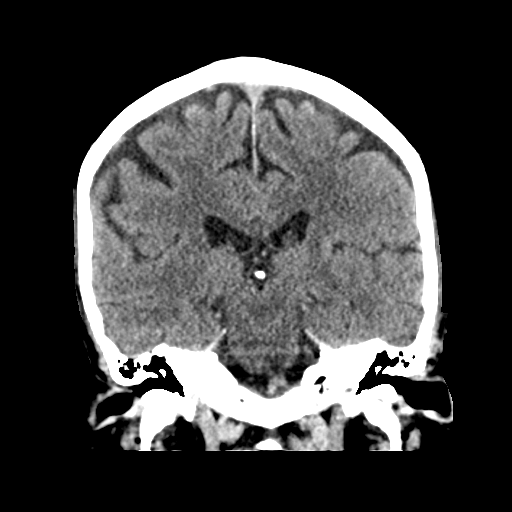
[im 39/71  brain]
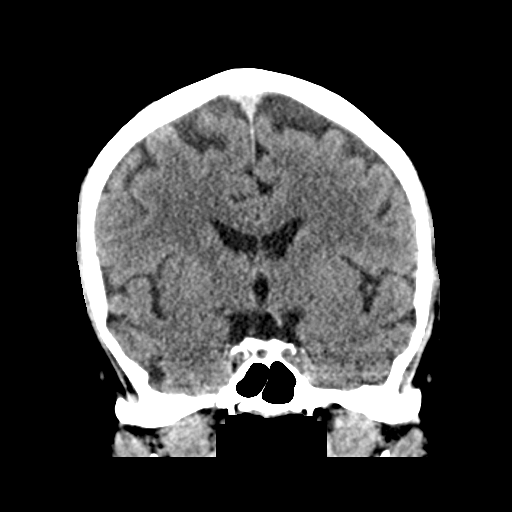

[Series 5: sagittal soft tissue · sagittal · 0.38mm/px · 3 of 55 slices shown]
[im 19/55  brain]
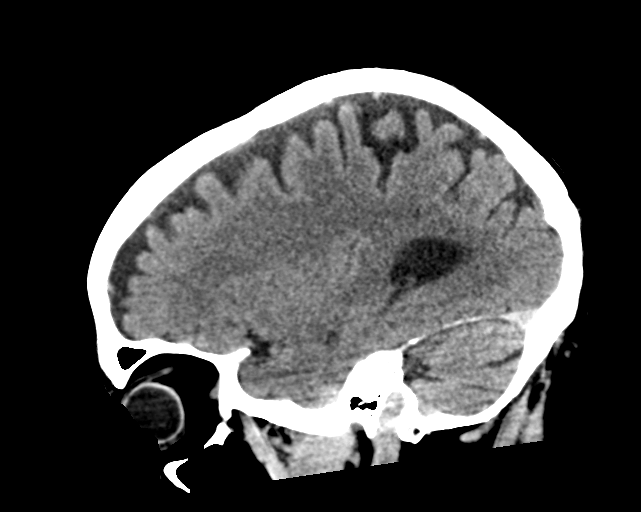
[im 28/55  brain]
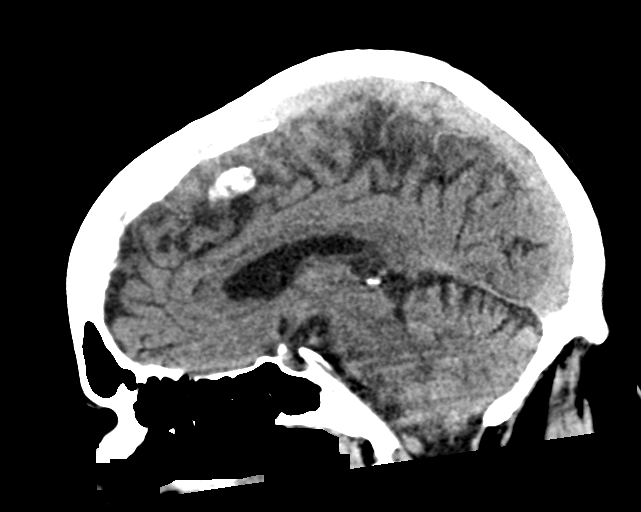
[im 37/55  brain]
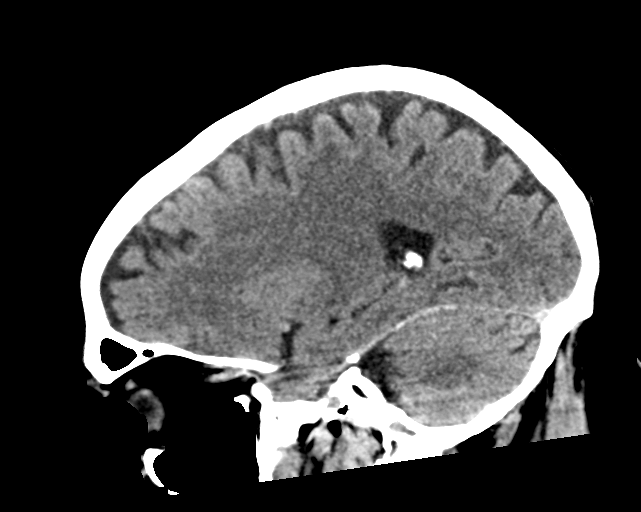

[16 of 47 positions shown; findings below may reference images not displayed]

FINDINGS: Brain: Normal ventricular morphology. No midline shift or mass
effect. Normal appearance of brain parenchyma. No intracranial
hemorrhage, mass lesion, or evidence acute infarction. No
extra-axial fluid collections.

Vascular: Normal appearance

Skull: Normal appearance

Sinuses/Orbits: Normal appearance

Other: N/A
IMPRESSION: Normal exam.

## 2018-12-08 NOTE — Progress Notes (Signed)
Carelink Summary Report / Loop Recorder 

## 2018-12-28 LAB — CUP PACEART REMOTE DEVICE CHECK
Date Time Interrogation Session: 20200815103557
Implantable Pulse Generator Implant Date: 20180705

## 2018-12-29 ENCOUNTER — Ambulatory Visit (INDEPENDENT_AMBULATORY_CARE_PROVIDER_SITE_OTHER): Payer: PPO | Admitting: *Deleted

## 2018-12-29 DIAGNOSIS — R55 Syncope and collapse: Secondary | ICD-10-CM

## 2018-12-30 ENCOUNTER — Ambulatory Visit: Payer: PPO

## 2018-12-30 NOTE — Progress Notes (Signed)
Subjective:   Roberto Leonard is a 78 y.o. male who presents for Medicare Annual/Subsequent preventive examination.      Review of Systems:  N/A  Cardiac Risk Factors include: advanced age (>41men, >51 women);diabetes mellitus;dyslipidemia;hypertension;male gender     Objective:    Vitals: BP 132/76 (BP Location: Right Arm)   Pulse (!) 55   Temp 98.3 F (36.8 C) (Oral)   Ht 6\' 2"  (1.88 m)   Wt 213 lb 6.4 oz (96.8 kg)   BMI 27.40 kg/m   Body mass index is 27.4 kg/m.   Advanced Directives 12/31/2018 12/23/2017 11/15/2016 11/11/2016 05/16/2016 11/14/2015 08/11/2015  Does Patient Have a Medical Advance Directive? Yes Yes No Yes Yes Yes Yes  Type of Paramedic of [place];Living will [place];Living will - [place];Living will [place];Living will Living will;Healthcare Power of [place];Living will  Does patient want to make changes to medical advance directive? - - - No - Patient declined - - -  Copy of [place] in Chart? No - copy requested No - copy requested - No - copy requested - - -  Would patient like information on creating a medical advance directive? - - No - Patient declined - - - -    Tobacco Social History   Tobacco Use  Smoking Status Former Smoker  . Packs/day: 0.50  . Years: 2.00  . Pack years: 1.00  . Types: Cigarettes  . Quit date: 05/14/1963  . Years since quitting: 55.6  Smokeless Tobacco Never Used     Counseling given: Not Answered   Clinical Intake:  Pre-visit preparation completed: Yes  Pain : No/denies pain Pain Score: 0-No pain     Nutritional Status: BMI 25 -29 Overweight Nutritional Risks: None Diabetes: Yes  How often do you need to have someone help you when you read instructions, pamphlets, or other written materials from your doctor or pharmacy?: 1 - Never   Diabetes:  Is the patient diabetic?   Yes type 2 If diabetic, was a CBG obtained today?  No  Did the patient bring in their glucometer from home?  No  How often do you monitor your CBG's? Occasionally, not on a schedule.   Financial Strains and Diabetes Management:  Are you having any financial strains with the device, your supplies or your medication? No .  Does the patient want to be seen by Chronic Care Management for management of their diabetes?  No  Would the patient like to be referred to a Nutritionist or for Diabetic Management?  No   Diabetic Exams:  Diabetic Eye Exam: Completed 04/09/18. Repeat yearly.   Diabetic Foot Exam: Completed 05/16/16. Pt has been advised about the importance in completing this exam.  Note made to follow up on this at next in office visit.    Interpreter Needed?: No  Information entered by :: [facility], LPN  Past Medical History:  Diagnosis Date  . Allergy   . Detached retina    RIGHT  . Diabetes mellitus without complication (HCC)    ORAL MED  . Dupuytren's contracture   . ED (erectile dysfunction)   . GERD (gastroesophageal reflux disease)   . Glaucoma   . Hyperlipidemia   . Hypertension    Past Surgical History:  Procedure Laterality Date  . CATARACT EXTRACTION     right  . COLONOSCOPY WITH PROPOFOL N/A 01/03/2015   Procedure: COLONOSCOPY WITH PROPOFOL;  Surgeon: Lucilla Lame, MD;  Location: [facility];  Service: Endoscopy;  Laterality: N/A;  DIABETIC-ORAL MEDS  . HERNIA REPAIR    . LOOP RECORDER INSERTION N/A 11/15/2016   Procedure: Loop Recorder Insertion;  Surgeon: Deboraha Sprang, MD;  Location: [facility];  Service: Cardiovascular;  Laterality: N/A;  . POLYPECTOMY N/A 01/03/2015   Procedure: POLYPECTOMY;  Surgeon: Lucilla Lame, MD;  Location: [facility];  Service: Endoscopy;  Laterality: N/A;  Sigmoid polyp  . RETINAL DETACHMENT SURGERY     right eye, wrong size lense, secondary surgery to replace it   Family History  Problem Relation Age of  Onset  . Heart disease Mother        Died from CHF  . Diabetes Mother   . Diabetes Father   . Hypertension Father   . Heart disease Father        plaque build up  . Diabetes Brother   . Diabetes Daughter        type 1   Social History   Socioeconomic History  . Marital status: Married    Spouse name: Pricilla  . Number of children: 3  . Years of education: college  . Highest education level: Bachelor's degree (e.g., BA, AB, BS)  Occupational History  . Occupation: Retired  . 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 Financial resource strain: Not hard at all  . Food insecurity    Worry: Never true    Inability: Never true  . Transportation needs    Medical: No    Non-medical: No  Tobacco Use  . Smoking status: Former Smoker    Packs/day: 0.50    Years: 2.00    Pack years: 1.00    Types: Cigarettes    Quit date: 05/14/1963    Years since quitting: 55.6  . Smokeless tobacco: Never Used  Substance and Sexual Activity  . Alcohol use: No    Alcohol/week: 0.0 standard drinks  . Drug use: No  . Sexual activity: Not on file  Lifestyle  . Physical activity    Days per week: 7 days    Minutes per session: 30 min  . Stress: Not at all  Relationships  . Social Herbalist on phone: Patient refused    Gets together: Patient refused    Attends religious service: Patient refused    Active member of club or organization: Patient refused    Attends meetings of clubs or organizations: Patient refused    Relationship status: Patient refused  Other Topics Concern  . Not on file  Social History Narrative  . Not on file    Outpatient Encounter Medications as of 12/31/2018  Medication Sig  . brimonidine (ALPHAGAN) 0.2 % ophthalmic solution Place 1 drop into both eyes 2 (two) times daily.  . Cetirizine HCl 10 MG CAPS Take 10 mg by mouth daily.   . cholecalciferol (VITAMIN D) 1000 units  tablet Take 1,000 Units by mouth daily.  Marland Kitchen diltiazem (TIAZAC) 180 MG 24 hr capsule Take 180 mg daily by mouth.  . dorzolamide-timolol (COSOPT) 22.3-6.8 MG/ML ophthalmic solution Place 1 drop into both eyes 2 (two) times daily.  Marland Kitchen ELIQUIS 5 MG TABS tablet TAKE 1 TABLET BY MOUTH TWICE A DAY  . FLUZONE HIGH-DOSE QUADRIVALENT 0.7 ML SUSY TO BE ADMINISTERED BY PHARMACIST FOR IMMUNIZATION  . glucose blood test strip Use with  meter to test daily and as needfed  . losartan (COZAAR) 100 MG tablet TAKE 1 TABLET (100 MG TOTAL) BY MOUTH DAILY IN THE MORNING  . Multiple Vitamin (MULTIVITAMIN WITH MINERALS) TABS tablet Take 1 tablet by mouth daily.  . Omega-3 Fatty Acids (FISH OIL BURP-LESS) 1200 MG CAPS Take 2 capsules by mouth every evening.   Marland Kitchen omeprazole (PRILOSEC) 20 MG capsule TAKE 1 CAPSULE BY MOUTH EVERY DAY  . pioglitazone (ACTOS) 30 MG tablet TAKE 1 TABLET BY MOUTH EVERY DAY IN THE MORNING  . simvastatin (ZOCOR) 20 MG tablet Take 1 tablet (20 mg total) by mouth at bedtime.  . simvastatin (ZOCOR) 10 MG tablet Take 10 mg daily by mouth.   No facility-administered encounter medications on file as of 12/31/2018.     Activities of Daily Living In your present state of health, do you have any difficulty performing the following activities: 12/31/2018  Hearing? Y  Comment Does not wear hearing aids.  Vision? N  Difficulty concentrating or making decisions? N  Walking or climbing stairs? N  Dressing or bathing? N  Doing errands, shopping? N  Preparing Food and eating ? N  Using the Toilet? N  In the past six months, have you accidently leaked urine? N  Do you have problems with loss of bowel control? N  Managing your Medications? N  Managing your Finances? N  Housekeeping or managing your Housekeeping? N  Some recent data might be hidden    Patient Care Team: [name]., MD as PCP - General (Family Medicine) Deboraha Sprang, MD as Consulting Physician (Cardiology) Isaias Cowman, MD as Consulting Physician (Cardiology) Estill Cotta, MD (Ophthalmology)   Assessment:   This is a routine wellness examination for [place].  Exercise Activities and Dietary recommendations Current Exercise Habits: Home exercise routine, Type of exercise: walking, Time (Minutes): 30, Frequency (Times/Week): 7, Weekly Exercise (Minutes/Week): 210, Intensity: Mild, Exercise limited by: None identified  Goals    . DIET - INCREASE WATER INTAKE     Recommend increasing water intake to 4-6 glasses a day.        Fall Risk Fall Risk  12/31/2018 12/23/2017 09/30/2017 05/16/2016  Falls in the past year? 0 No No No   FALL RISK PREVENTION PERTAINING TO THE HOME:  Any stairs in or around the home? No  If so, are there any without handrails? N/A  Home free of loose throw rugs in walkways, pet beds, electrical cords, etc? Yes  Adequate lighting in your home to reduce risk of falls? Yes   ASSISTIVE DEVICES UTILIZED TO PREVENT FALLS:  Life alert? No  Use of a cane, walker or w/c? No  Grab bars in the bathroom? Yes  Shower chair or bench in shower? No  Elevated toilet seat or a handicapped toilet? No    TIMED UP AND GO:  Was the test performed? No .    Depression Screen PHQ 2/9 Scores 12/31/2018 12/23/2017 09/30/2017 05/16/2016  PHQ - 2 Score 0 0 0 0    Cognitive Function     6CIT Screen 12/31/2018 12/23/2017  What Year? 0 points 0 points  What month? 0 points 0 points  What time? 0 points 0 points  Count back from 20 0 points 0 points  Months in reverse 0 points 0 points  Repeat phrase 0 points 0 points  Total Score 0 0    Immunization History  Administered Date(s) Administered  . Hepatitis B, adult 05/16/2016, 08/29/2016, 11/19/2016  .  Influenza Split 01/27/2016  . Influenza, High Dose Seasonal PF 02/10/2015, 01/15/2018  . Influenza-Unspecified 01/29/2017, 01/15/2018  . Pneumococcal Conjugate-13 01/12/2014  . Pneumococcal Polysaccharide-23 06/23/1998, 03/08/2005   . Td 05/21/2006, 09/16/2018  . Zoster 09/12/2005  . Zoster Recombinat (Shingrix) 08/30/2016, 11/10/2016    Qualifies for Shingles Vaccine? Completed series  Tdap: Up to date  Flu Vaccine: Due fall 2020  Pneumococcal Vaccine: Completed series  Screening Tests Health Maintenance  Topic Date Due  . FOOT EXAM  05/16/2017  . HEMOGLOBIN A1C  03/19/2019  . OPHTHALMOLOGY EXAM  04/10/2019  . COLONOSCOPY  01/03/2020  . TETANUS/TDAP  09/15/2028  . INFLUENZA VACCINE  Completed  . PNA vac Low Risk Adult  Completed   Cancer Screenings:  Colorectal Screening: Completed 01/03/15. Repeat every 5 years.  Lung Cancer Screening: (Low Dose CT Chest recommended if Age 68-80 years, 30 pack-year currently smoking OR have quit w/in 15years.) does not qualify.   Additional Screening:  Dental Screening: Recommended annual dental exams for proper oral hygiene  Community Resource Referral:  CRR required this visit?  No        Plan:  I have personally reviewed and addressed the Medicare Annual Wellness questionnaire and have noted the following in the patient's chart:  A. Medical and social history B. Use of alcohol, tobacco or illicit drugs  C. Current medications and supplements D. Functional ability and status E.  Nutritional status F.  Physical activity G. Advance directives H. List of other physicians I.  Hospitalizations, surgeries, and ER visits in previous 12 months J.  [place] such as hearing and vision if needed, cognitive and depression L. Referrals and appointments   In addition, I have reviewed and discussed with patient certain preventive protocols, quality metrics, and best practice recommendations. A written personalized care plan for preventive services as well as general preventive health recommendations were provided to patient.   Glendora Score, [place]  [id number] Nurse Health Advisor  Nurse Notes: Needs a diabetic foot exam at next in office  visit.

## 2018-12-31 ENCOUNTER — Other Ambulatory Visit: Payer: Self-pay

## 2018-12-31 ENCOUNTER — Ambulatory Visit (INDEPENDENT_AMBULATORY_CARE_PROVIDER_SITE_OTHER): Payer: PPO

## 2018-12-31 VITALS — BP 132/76 | HR 55 | Temp 98.3°F | Ht 74.0 in | Wt 213.4 lb

## 2018-12-31 DIAGNOSIS — Z0000 Encounter for general adult medical examination without abnormal findings: Secondary | ICD-10-CM

## 2018-12-31 NOTE — Patient Instructions (Signed)
Roberto Leonard , Thank you for taking time to come for your Medicare Wellness Visit. I appreciate your ongoing commitment to your health goals. Please review the following plan we discussed and let me know if I can assist you in the future.   Screening recommendations/referrals: Colonoscopy: Up to date, due 12/2019 Recommended yearly ophthalmology/optometry visit for glaucoma screening and checkup Recommended yearly dental visit for hygiene and checkup  Vaccinations: Influenza vaccine: Up to date Pneumococcal vaccine: Completed series Tdap vaccine: Up to date, due 09/2021 Shingles vaccine: Completed series    Advanced directives: Please bring a copy of your POA (Power of [place]) and/or Living Will to your next appointment.   Conditions/risks identified: Continue to increase water intake to 6-8 8 oz glasses a day as tolerated.   Next appointment: 03/16/19 @ 10 AM with Dr Rosanna Randy.    Preventive Care 79 Years and Older, Male Preventive care refers to lifestyle choices and visits with your health care provider that can promote health and wellness. What does preventive care include?  A yearly physical exam. This is also called an annual well check.  Dental exams once or twice a year.  Routine eye exams. Ask your health care provider how often you should have your eyes checked.  Personal lifestyle choices, including:  Daily care of your teeth and gums.  Regular physical activity.  Eating a healthy diet.  Avoiding tobacco and drug use.  Limiting alcohol use.  Practicing safe sex.  Taking low doses of aspirin every day.  Taking vitamin and mineral supplements as recommended by your health care provider. What happens during an annual well check? The services and screenings done by your health care provider during your annual well check will depend on your age, overall health, lifestyle risk factors, and family history of disease. Counseling  Your health care provider may ask  you questions about your:  Alcohol use.  Tobacco use.  Drug use.  Emotional well-being.  Home and relationship well-being.  Sexual activity.  Eating habits.  History of falls.  Memory and ability to understand (cognition).  Work and work Statistician. Screening  You may have the following tests or measurements:  Height, weight, and BMI.  Blood pressure.  Lipid and cholesterol levels. These may be checked every 5 years, or more frequently if you are over 46 years old.  Skin check.  Lung cancer screening. You may have this screening every year starting at age 52 if you have a 30-pack-year history of smoking and currently smoke or have quit within the past 15 years.  Fecal occult blood test (FOBT) of the stool. You may have this test every year starting at age 35.  Flexible sigmoidoscopy or colonoscopy. You may have a sigmoidoscopy every 5 years or a colonoscopy every 10 years starting at age 70.  Prostate cancer screening. Recommendations will vary depending on your family history and other risks.  Hepatitis C blood test.  Hepatitis B blood test.  Sexually transmitted disease (STD) testing.  Diabetes screening. This is done by checking your blood sugar (glucose) after you have not eaten for a while (fasting). You may have this done every 1-3 years.  Abdominal aortic aneurysm (AAA) screening. You may need this if you are a current or former smoker.  Osteoporosis. You may be screened starting at age 45 if you are at high risk. Talk with your health care provider about your test results, treatment options, and if necessary, the need for more tests. Vaccines  Your health care  provider may recommend certain vaccines, such as:  Influenza vaccine. This is recommended every year.  Tetanus, diphtheria, and acellular pertussis (Tdap, Td) vaccine. You may need a Td booster every 10 years.  Zoster vaccine. You may need this after age 27.  Pneumococcal 13-valent conjugate  (PCV13) vaccine. One dose is recommended after age 54.  Pneumococcal polysaccharide (PPSV23) vaccine. One dose is recommended after age 54. Talk to your health care provider about which screenings and vaccines you need and how often you need them. This information is not intended to replace advice given to you by your health care provider. Make sure you discuss any questions you have with your health care provider. Document Released: 05/27/2015 Document Revised: 01/18/2016 Document Reviewed: 03/01/2015 Elsevier Interactive Patient Education  2017 [place] Prevention in the Home Falls can cause injuries. They can happen to people of all ages. There are many things you can do to make your home safe and to help prevent falls. What can I do on the outside of my home?  Regularly fix the edges of walkways and driveways and fix any cracks.  Remove anything that might make you trip as you walk through a door, such as a raised step or threshold.  Trim any bushes or trees on the path to your home.  Use bright outdoor lighting.  Clear any walking paths of anything that might make someone trip, such as rocks or tools.  Regularly check to see if handrails are loose or broken. Make sure that both sides of any steps have handrails.  Any raised decks and porches should have guardrails on the edges.  Have any leaves, snow, or ice cleared regularly.  Use sand or salt on walking paths during winter.  Clean up any spills in your garage right away. This includes oil or grease spills. What can I do in the bathroom?  Use night lights.  Install grab bars by the toilet and in the tub and shower. Do not use towel bars as grab bars.  Use non-skid mats or decals in the tub or shower.  If you need to sit down in the shower, use a plastic, non-slip stool.  Keep the floor dry. Clean up any water that spills on the floor as soon as it happens.  Remove soap buildup in the tub or shower  regularly.  Attach bath mats securely with double-sided non-slip rug tape.  Do not have throw rugs and other things on the floor that can make you trip. What can I do in the bedroom?  Use night lights.  Make sure that you have a light by your bed that is easy to reach.  Do not use any sheets or blankets that are too big for your bed. They should not hang down onto the floor.  Have a firm chair that has side arms. You can use this for support while you get dressed.  Do not have throw rugs and other things on the floor that can make you trip. What can I do in the kitchen?  Clean up any spills right away.  Avoid walking on wet floors.  Keep items that you use a lot in easy-to-reach places.  If you need to reach something above you, use a strong step stool that has a grab bar.  Keep electrical cords out of the way.  Do not use floor polish or wax that makes floors slippery. If you must use wax, use non-skid floor wax.  Do not have throw  rugs and other things on the floor that can make you trip. What can I do with my stairs?  Do not leave any items on the stairs.  Make sure that there are handrails on both sides of the stairs and use them. Fix handrails that are broken or loose. Make sure that handrails are as long as the stairways.  Check any carpeting to make sure that it is firmly attached to the stairs. Fix any carpet that is loose or worn.  Avoid having throw rugs at the top or bottom of the stairs. If you do have throw rugs, attach them to the floor with carpet tape.  Make sure that you have a light switch at the top of the stairs and the bottom of the stairs. If you do not have them, ask someone to add them for you. What else can I do to help prevent falls?  Wear shoes that:  Do not have high heels.  Have rubber bottoms.  Are comfortable and fit you well.  Are closed at the toe. Do not wear sandals.  If you use a stepladder:  Make sure that it is fully  opened. Do not climb a closed stepladder.  Make sure that both sides of the stepladder are locked into place.  Ask someone to hold it for you, if possible.  Clearly mark and make sure that you can see:  Any grab bars or handrails.  First and last steps.  Where the edge of each step is.  Use tools that help you move around (mobility aids) if they are needed. These include:  Canes.  Walkers.  Scooters.  Crutches.  Turn on the lights when you go into a dark area. Replace any light bulbs as soon as they burn out.  Set up your furniture so you have a clear path. Avoid moving your furniture around.  If any of your floors are uneven, fix them.  If there are any pets around you, be aware of where they are.  Review your medicines with your doctor. Some medicines can make you feel dizzy. This can increase your chance of falling. Ask your doctor what other things that you can do to help prevent falls. This information is not intended to replace advice given to you by your health care provider. Make sure you discuss any questions you have with your health care provider. Document Released: 02/24/2009 Document Revised: 10/06/2015 Document Reviewed: 06/04/2014 Elsevier Interactive Patient Education  2017 [employer].

## 2019-01-06 ENCOUNTER — Other Ambulatory Visit: Payer: Self-pay

## 2019-01-06 DIAGNOSIS — Z20822 Contact with and (suspected) exposure to covid-19: Secondary | ICD-10-CM

## 2019-01-06 DIAGNOSIS — R6889 Other general symptoms and signs: Secondary | ICD-10-CM | POA: Diagnosis not present

## 2019-01-06 NOTE — Progress Notes (Signed)
Carelink Summary Report / Loop Recorder 

## 2019-01-07 LAB — SPECIMEN STATUS REPORT

## 2019-01-07 LAB — NOVEL CORONAVIRUS, NAA: SARS-CoV-2, NAA: NOT DETECTED

## 2019-01-15 DIAGNOSIS — X32XXXA Exposure to sunlight, initial encounter: Secondary | ICD-10-CM | POA: Diagnosis not present

## 2019-01-15 DIAGNOSIS — D225 Melanocytic nevi of trunk: Secondary | ICD-10-CM | POA: Diagnosis not present

## 2019-01-15 DIAGNOSIS — L570 Actinic keratosis: Secondary | ICD-10-CM | POA: Diagnosis not present

## 2019-01-15 DIAGNOSIS — D2272 Melanocytic nevi of left lower limb, including hip: Secondary | ICD-10-CM | POA: Diagnosis not present

## 2019-01-15 DIAGNOSIS — L821 Other seborrheic keratosis: Secondary | ICD-10-CM | POA: Diagnosis not present

## 2019-01-15 DIAGNOSIS — Z08 Encounter for follow-up examination after completed treatment for malignant neoplasm: Secondary | ICD-10-CM | POA: Diagnosis not present

## 2019-01-15 DIAGNOSIS — D2261 Melanocytic nevi of right upper limb, including shoulder: Secondary | ICD-10-CM | POA: Diagnosis not present

## 2019-01-15 DIAGNOSIS — D2271 Melanocytic nevi of right lower limb, including hip: Secondary | ICD-10-CM | POA: Diagnosis not present

## 2019-01-15 DIAGNOSIS — Z85828 Personal history of other malignant neoplasm of skin: Secondary | ICD-10-CM | POA: Diagnosis not present

## 2019-01-15 DIAGNOSIS — D2262 Melanocytic nevi of left upper limb, including shoulder: Secondary | ICD-10-CM | POA: Diagnosis not present

## 2019-01-29 ENCOUNTER — Ambulatory Visit (INDEPENDENT_AMBULATORY_CARE_PROVIDER_SITE_OTHER): Payer: PPO | Admitting: *Deleted

## 2019-01-29 DIAGNOSIS — R55 Syncope and collapse: Secondary | ICD-10-CM | POA: Diagnosis not present

## 2019-01-29 LAB — CUP PACEART REMOTE DEVICE CHECK
Date Time Interrogation Session: 20200917110238
Implantable Pulse Generator Implant Date: 20180705

## 2019-02-02 NOTE — Progress Notes (Signed)
Carelink Summary Report / Loop Recorder 

## 2019-03-03 ENCOUNTER — Ambulatory Visit (INDEPENDENT_AMBULATORY_CARE_PROVIDER_SITE_OTHER): Payer: PPO | Admitting: *Deleted

## 2019-03-03 DIAGNOSIS — R55 Syncope and collapse: Secondary | ICD-10-CM | POA: Diagnosis not present

## 2019-03-04 LAB — CUP PACEART REMOTE DEVICE CHECK
Date Time Interrogation Session: 20201020121027
Implantable Pulse Generator Implant Date: 20180705

## 2019-03-12 DIAGNOSIS — R001 Bradycardia, unspecified: Secondary | ICD-10-CM | POA: Diagnosis not present

## 2019-03-12 DIAGNOSIS — I10 Essential (primary) hypertension: Secondary | ICD-10-CM | POA: Diagnosis not present

## 2019-03-12 DIAGNOSIS — E119 Type 2 diabetes mellitus without complications: Secondary | ICD-10-CM | POA: Diagnosis not present

## 2019-03-12 DIAGNOSIS — R55 Syncope and collapse: Secondary | ICD-10-CM | POA: Diagnosis not present

## 2019-03-12 DIAGNOSIS — I480 Paroxysmal atrial fibrillation: Secondary | ICD-10-CM | POA: Diagnosis not present

## 2019-03-12 DIAGNOSIS — I4510 Unspecified right bundle-branch block: Secondary | ICD-10-CM | POA: Diagnosis not present

## 2019-03-13 NOTE — Progress Notes (Signed)
Patient: Roberto Leonard, Male    DOB: 1940/12/24, 78 y.o.   MRN: [id number] Visit Date: 03/16/2019  Today's Provider: [name], MD   Chief Complaint  Patient presents with  . Annual Exam   Subjective:    I,[name],RMA am acting as a scribe for [name]., MD.  Patient had AWV with [facility] on 12/31/18   Complete Physical [name] is a 78 y.o. male. He feels well. He reports exercising. He reports he is sleeping fairly well. He is walking 2 miles per day. ----------------------------------------------------------- PSA: Not following with the Urologist.  Last PSA was 4.7 09/16/2018  Review of Systems  Constitutional: Negative.   HENT: Negative.   Eyes: Negative.   Respiratory: Positive for cough.   Cardiovascular: Negative.   Gastrointestinal: Negative.   Endocrine: Positive for polyuria.  Genitourinary: Negative.   Musculoskeletal: Negative.   Skin: Negative.   Allergic/Immunologic: Negative.   Neurological: Negative.   Hematological: Negative.   Psychiatric/Behavioral: Negative.   Patient saw Dr. Saralyn Pilar from cardiology last week and has a chronic loop recorder in place.  Social History   Socioeconomic History  . Marital status: Married    Spouse name: Pricilla  . Number of children: 3  . Years of education: college  . Highest education level: Bachelor's degree (e.g., BA, AB, BS)  Occupational History  . Occupation: Retired  . 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 Financial resource strain: Not hard at all  . Food insecurity    Worry: Never true    Inability: Never true  . Transportation needs    Medical: No    Non-medical: No  Tobacco Use  . Smoking status: Former Smoker    Packs/day: 0.50    Years: 2.00    Pack years: 1.00    Types: Cigarettes    Quit date: 05/14/1963    Years since quitting: 55.8  . Smokeless tobacco:  Never Used  Substance and Sexual Activity  . Alcohol use: No    Alcohol/week: 0.0 standard drinks  . Drug use: No  . Sexual activity: Not on file  Lifestyle  . Physical activity    Days per week: 7 days    Minutes per session: 30 min  . Stress: Not at all  Relationships  . Social Herbalist on phone: Patient refused    Gets together: Patient refused    Attends religious service: Patient refused    Active member of club or organization: Patient refused    Attends meetings of clubs or organizations: Patient refused    Relationship status: Patient refused  . Intimate partner violence    Fear of current or ex partner: Patient refused    Emotionally abused: Patient refused    Physically abused: Patient refused    Forced sexual activity: Patient refused  Other Topics Concern  . Not on file  Social History Narrative  . Not on file    Past Medical History:  Diagnosis Date  . Allergy   . Detached retina    RIGHT  . Diabetes mellitus without complication (HCC)    ORAL MED  . Dupuytren's contracture   . ED (erectile dysfunction)   . GERD (gastroesophageal reflux disease)   . Glaucoma   . Hyperlipidemia   . Hypertension      Patient Active Problem List  Diagnosis Date Noted  . Syncope 11/11/2016  . Memory loss 04/30/2015  . Special screening for malignant neoplasms, colon   . Benign neoplasm of sigmoid colon   . Allergic rhinitis 09/24/2014  . 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 Cheilitis 09/24/2014  . Abnormal prostate specific antigen 09/24/2014  . Erectile dysfunction associated with type 2 diabetes mellitus ([place]) 09/24/2014  . 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 Essential (primary) hypertension 09/24/2014  . Acid reflux 09/24/2014  . Glaucoma 09/24/2014  . Inguinal hernia 09/24/2014  . Hypercholesteremia 09/24/2014  . Detached retina 09/24/2014  . Type 2 diabetes mellitus with diabetic neuropathic  arthropathy ([place]) 09/24/2014    Past Surgical History:  Procedure Laterality Date  . CATARACT EXTRACTION     right  . COLONOSCOPY WITH PROPOFOL N/A 01/03/2015   Procedure: COLONOSCOPY WITH PROPOFOL;  Surgeon: Lucilla Lame, MD;  Location: [facility];  Service: Endoscopy;  Laterality: N/A;  DIABETIC-ORAL MEDS  . HERNIA REPAIR    . LOOP RECORDER INSERTION N/A 11/15/2016   Procedure: Loop Recorder Insertion;  Surgeon: Deboraha Sprang, MD;  Location: [facility];  Service: Cardiovascular;  Laterality: N/A;  . POLYPECTOMY N/A 01/03/2015   Procedure: POLYPECTOMY;  Surgeon: Lucilla Lame, MD;  Location: [facility];  Service: Endoscopy;  Laterality: N/A;  Sigmoid polyp  . RETINAL DETACHMENT SURGERY     right eye, wrong size lense, secondary surgery to replace it    His family history includes Diabetes in his brother, daughter, father, and mother; Heart disease in his father and mother; Hypertension in his father.   Current Outpatient Medications:  .  brimonidine (ALPHAGAN) 0.2 % ophthalmic solution, Place 1 drop into both eyes 2 (two) times daily., Disp: , Rfl: 3 .  Cetirizine HCl 10 MG CAPS, Take 10 mg by mouth daily. , Disp: , Rfl:  .  cholecalciferol (VITAMIN D) 1000 units tablet, Take 1,000 Units by mouth daily., Disp: , Rfl:  .  diltiazem (TIAZAC) 180 MG 24 hr capsule, Take 180 mg daily by mouth., Disp: , Rfl:  .  dorzolamide-timolol (COSOPT) 22.3-6.8 MG/ML ophthalmic solution, Place 1 drop into both eyes 2 (two) times daily., Disp: , Rfl:  .  ELIQUIS 5 MG TABS tablet, TAKE 1 TABLET BY MOUTH TWICE A DAY, Disp: 180 tablet, Rfl: 3 .  glucose blood test strip, Use with meter to test daily and as needfed, Disp: 100 each, Rfl: 12 .  losartan (COZAAR) 100 MG tablet, TAKE 1 TABLET (100 MG TOTAL) BY MOUTH DAILY IN THE MORNING, Disp: 90 tablet, Rfl: 3 .  Multiple Vitamin (MULTIVITAMIN WITH MINERALS) TABS tablet, Take 1 tablet by mouth daily., Disp: , Rfl:  .  Omega-3 Fatty Acids  (FISH OIL BURP-LESS) 1200 MG CAPS, Take 2 capsules by mouth every evening. , Disp: , Rfl:  .  omeprazole (PRILOSEC) 20 MG capsule, TAKE 1 CAPSULE BY MOUTH EVERY DAY, Disp: 90 capsule, Rfl: 2 .  pioglitazone (ACTOS) 30 MG tablet, TAKE 1 TABLET BY MOUTH EVERY DAY IN THE MORNING, Disp: 90 tablet, Rfl: 3 .  simvastatin (ZOCOR) 20 MG tablet, Take 1 tablet (20 mg total) by mouth at bedtime., Disp: 90 tablet, Rfl: 3  Patient Care Team: [name]., MD as PCP - General (Family Medicine) Deboraha Sprang, MD as Consulting Physician (Cardiology) Isaias Cowman, MD as Consulting Physician (Cardiology) Dingeldein, Remo Lipps, MD (Ophthalmology)     Objective:    Vitals: BP Marland Kitchen)  142/78 (BP Location: Right Arm, Patient Position: Sitting, Cuff Size: Large)   Pulse (!) 56   Temp (!) 97 F (36.1 C) (Temporal)   Resp 16   Ht 6\' 2"  (1.88 m)   Wt 215 lb 6.4 oz (97.7 kg)   BMI 27.66 kg/m   Physical Exam Vitals signs reviewed.  Constitutional:      Appearance: Normal appearance.     Comments: Patient appears younger than his age of 57.  HENT:     Head: Normocephalic and atraumatic.     Right Ear: External ear normal.     Left Ear: External ear normal.     Nose: Nose normal.     Mouth/Throat:     Pharynx: Oropharynx is clear.  Eyes:     General: No scleral icterus.    Conjunctiva/sclera: Conjunctivae normal.  Cardiovascular:     Rate and Rhythm: Normal rate and regular rhythm.     Pulses: Normal pulses.     Heart sounds: Normal heart sounds.  Pulmonary:     Effort: Pulmonary effort is normal.     Breath sounds: Normal breath sounds.  Abdominal:     Palpations: Abdomen is soft.  Musculoskeletal:     Right lower leg: No edema.     Left lower leg: No edema.  Lymphadenopathy:     Cervical: No cervical adenopathy.  Skin:    General: Skin is warm and dry.  Neurological:     Mental Status: He is alert and oriented to person, place, and time. Mental status is at baseline.   Psychiatric:        Mood and Affect: Mood normal.        Behavior: Behavior normal.        Thought Content: Thought content normal.        Judgment: Judgment normal.     Activities of Daily Living In your present state of health, do you have any difficulty performing the following activities: 12/31/2018  Hearing? Y  Comment Does not wear hearing aids.  Vision? N  Difficulty concentrating or making decisions? N  Walking or climbing stairs? N  Dressing or bathing? N  Doing errands, shopping? N  Preparing Food and eating ? N  Using the Toilet? N  In the past six months, have you accidently leaked urine? N  Do you have problems with loss of bowel control? N  Managing your Medications? N  Managing your Finances? N  Housekeeping or managing your Housekeeping? N  Some recent data might be hidden    Fall Risk Assessment Fall Risk  03/16/2019 12/31/2018 12/23/2017 09/30/2017 05/16/2016  Falls in the past year? 1 0 No No No  Number falls in past yr: 0 - - - -  Injury with Fall? 0 - - - -  Follow up Falls evaluation completed - - - -     Depression Screen PHQ 2/9 Scores 12/31/2018 12/23/2017 09/30/2017 05/16/2016  PHQ - 2 Score 0 0 0 0    6CIT Screen 12/31/2018  What Year? 0 points  What month? 0 points  What time? 0 points  Count back from 20 0 points  Months in reverse 0 points  Repeat phrase 0 points  Total Score 0       Assessment & Plan:    Annual Physical Reviewed patient's Family Medical History Reviewed and updated list of patient's medical providers Assessment of cognitive impairment was done Assessed patient's functional ability Established a written schedule for health screening services Health  Risk Assessent Completed and Reviewed  Exercise Activities and Dietary recommendations Goals    . DIET - INCREASE WATER INTAKE     Recommend increasing water intake to 4-6 glasses a day.        Immunization History  Administered Date(s) Administered  . Hepatitis B,  adult 05/16/2016, 08/29/2016, 11/19/2016  . Influenza Split 01/27/2016  . Influenza, High Dose Seasonal PF 02/10/2015, 01/15/2018  . Influenza-Unspecified 01/29/2017, 01/15/2018, 12/25/2018  . Pneumococcal Conjugate-13 01/12/2014  . Pneumococcal Polysaccharide-23 06/23/1998, 03/08/2005  . Td 05/21/2006, 09/16/2018  . Zoster 09/12/2005  . Zoster Recombinat (Shingrix) 08/30/2016, 11/10/2016    Health Maintenance  Topic Date Due  . FOOT EXAM  05/16/2017  . HEMOGLOBIN A1C  03/19/2019  . OPHTHALMOLOGY EXAM  04/10/2019  . COLONOSCOPY  01/03/2020  . TETANUS/TDAP  09/15/2028  . INFLUENZA VACCINE  Completed  . PNA vac Low Risk Adult  Completed     Discussed health benefits of physical activity, and encouraged him to engage in regular exercise appropriate for his age and condition.   1. Annual physical exam Colonoscopy in 2021  2. Routine general medical examination at a health care facility  - CBC with Differential/Platelet - Comprehensive metabolic panel - Hemoglobin A1c - TSH  3. Type 2 diabetes mellitus with diabetic neuropathic arthropathy, without long-term current use of insulin (HCC)  - Hemoglobin A1c  4. Essential (primary) hypertension  - CBC with Differential/Platelet - Comprehensive metabolic panel - Hemoglobin A1c - TSH  5. Hypercholesteremia  - Lipid panel  6. Elevated PSA Follow-up PSA for urology. - PSA  ------------------------------------------------------------------------------------------------------------    [name], MD  [facility]

## 2019-03-16 ENCOUNTER — Encounter: Payer: Self-pay | Admitting: Family Medicine

## 2019-03-16 ENCOUNTER — Other Ambulatory Visit: Payer: Self-pay

## 2019-03-16 ENCOUNTER — Ambulatory Visit (INDEPENDENT_AMBULATORY_CARE_PROVIDER_SITE_OTHER): Payer: PPO | Admitting: Family Medicine

## 2019-03-16 VITALS — BP 142/78 | HR 56 | Temp 97.0°F | Resp 16 | Ht 74.0 in | Wt 215.4 lb

## 2019-03-16 DIAGNOSIS — I10 Essential (primary) hypertension: Secondary | ICD-10-CM | POA: Diagnosis not present

## 2019-03-16 DIAGNOSIS — Z0000 Encounter for general adult medical examination without abnormal findings: Secondary | ICD-10-CM

## 2019-03-16 DIAGNOSIS — E11610 Type 2 diabetes mellitus with diabetic neuropathic arthropathy: Secondary | ICD-10-CM

## 2019-03-16 DIAGNOSIS — R972 Elevated prostate specific antigen [PSA]: Secondary | ICD-10-CM

## 2019-03-16 DIAGNOSIS — E7800 Pure hypercholesterolemia, unspecified: Secondary | ICD-10-CM

## 2019-03-17 ENCOUNTER — Other Ambulatory Visit: Payer: Self-pay | Admitting: Family Medicine

## 2019-03-17 DIAGNOSIS — I10 Essential (primary) hypertension: Secondary | ICD-10-CM | POA: Diagnosis not present

## 2019-03-17 DIAGNOSIS — Z0000 Encounter for general adult medical examination without abnormal findings: Secondary | ICD-10-CM | POA: Diagnosis not present

## 2019-03-17 DIAGNOSIS — E7800 Pure hypercholesterolemia, unspecified: Secondary | ICD-10-CM | POA: Diagnosis not present

## 2019-03-17 DIAGNOSIS — R972 Elevated prostate specific antigen [PSA]: Secondary | ICD-10-CM | POA: Diagnosis not present

## 2019-03-17 DIAGNOSIS — E11610 Type 2 diabetes mellitus with diabetic neuropathic arthropathy: Secondary | ICD-10-CM | POA: Diagnosis not present

## 2019-03-18 LAB — LIPID PANEL
Chol/HDL Ratio: 4.5 ratio (ref 0.0–5.0)
Cholesterol, Total: 175 mg/dL (ref 100–199)
HDL: 39 mg/dL — ABNORMAL LOW (ref >39)
LDL Chol Calc (NIH): 105 mg/dL — ABNORMAL HIGH (ref 0–99)
Triglycerides: 176 mg/dL — ABNORMAL HIGH (ref 0–149)
VLDL Cholesterol Cal: 31 mg/dL (ref 5–40)

## 2019-03-18 LAB — CBC WITH DIFFERENTIAL/PLATELET
Basophils Absolute: 0.1 10*3/uL (ref 0.0–0.2)
Basos: 1 %
EOS (ABSOLUTE): 0.1 10*3/uL (ref 0.0–0.4)
Eos: 2 %
Hematocrit: 47.3 % (ref 37.5–51.0)
Hemoglobin: 15.7 g/dL (ref 13.0–17.7)
Immature Grans (Abs): 0 10*3/uL (ref 0.0–0.1)
Immature Granulocytes: 1 %
Lymphocytes Absolute: 1.6 10*3/uL (ref 0.7–3.1)
Lymphs: 27 %
MCH: 29.2 pg (ref 26.6–33.0)
MCHC: 33.2 g/dL (ref 31.5–35.7)
MCV: 88 fL (ref 79–97)
Monocytes Absolute: 0.6 10*3/uL (ref 0.1–0.9)
Monocytes: 10 %
Neutrophils Absolute: 3.6 10*3/uL (ref 1.4–7.0)
Neutrophils: 59 %
Platelets: 168 10*3/uL (ref 150–450)
RBC: 5.37 x10E6/uL (ref 4.14–5.80)
RDW: 12.5 % (ref 11.6–15.4)
WBC: 6 10*3/uL (ref 3.4–10.8)

## 2019-03-18 LAB — COMPREHENSIVE METABOLIC PANEL
ALT: 56 IU/L — ABNORMAL HIGH (ref 0–44)
AST: 37 IU/L (ref 0–40)
Albumin/Globulin Ratio: 1.7 (ref 1.2–2.2)
Albumin: 4.1 g/dL (ref 3.7–4.7)
Alkaline Phosphatase: 77 IU/L (ref 39–117)
BUN/Creatinine Ratio: 19 (ref 10–24)
BUN: 16 mg/dL (ref 8–27)
Bilirubin Total: 0.5 mg/dL (ref 0.0–1.2)
CO2: 18 mmol/L — ABNORMAL LOW (ref 20–29)
Calcium: 9.3 mg/dL (ref 8.6–10.2)
Chloride: 103 mmol/L (ref 96–106)
Creatinine, Ser: 0.84 mg/dL (ref 0.76–1.27)
GFR calc Af Amer: 96 mL/min/{1.73_m2} (ref >59)
GFR calc non Af Amer: 83 mL/min/{1.73_m2} (ref >59)
Globulin, Total: 2.4 g/dL (ref 1.5–4.5)
Glucose: 115 mg/dL — ABNORMAL HIGH (ref 65–99)
Potassium: 4.3 mmol/L (ref 3.5–5.2)
Sodium: 137 mmol/L (ref 134–144)
Total Protein: 6.5 g/dL (ref 6.0–8.5)

## 2019-03-18 LAB — PSA: Prostate Specific Ag, Serum: 4.8 ng/mL — ABNORMAL HIGH (ref 0.0–4.0)

## 2019-03-18 LAB — TSH: TSH: 1.4 u[IU]/mL (ref 0.450–4.500)

## 2019-03-18 LAB — HEMOGLOBIN A1C
Est. average glucose Bld gHb Est-mCnc: 128 mg/dL
Hgb A1c MFr Bld: 6.1 % — ABNORMAL HIGH (ref 4.8–5.6)

## 2019-03-19 NOTE — Progress Notes (Signed)
Carelink Summary Report / Loop Recorder 

## 2019-04-06 ENCOUNTER — Ambulatory Visit (INDEPENDENT_AMBULATORY_CARE_PROVIDER_SITE_OTHER): Payer: PPO | Admitting: *Deleted

## 2019-04-06 DIAGNOSIS — R55 Syncope and collapse: Secondary | ICD-10-CM

## 2019-04-06 LAB — CUP PACEART REMOTE DEVICE CHECK
Date Time Interrogation Session: 20201122100514
Implantable Pulse Generator Implant Date: 20180705

## 2019-04-25 ENCOUNTER — Other Ambulatory Visit: Payer: Self-pay | Admitting: Family Medicine

## 2019-05-07 ENCOUNTER — Ambulatory Visit (INDEPENDENT_AMBULATORY_CARE_PROVIDER_SITE_OTHER): Payer: PPO | Admitting: *Deleted

## 2019-05-07 DIAGNOSIS — R55 Syncope and collapse: Secondary | ICD-10-CM | POA: Diagnosis not present

## 2019-05-10 LAB — CUP PACEART REMOTE DEVICE CHECK
Date Time Interrogation Session: 20201224083850
Implantable Pulse Generator Implant Date: 20180705

## 2019-05-10 NOTE — Progress Notes (Signed)
ILR remote 

## 2019-05-13 DIAGNOSIS — H02889 Meibomian gland dysfunction of unspecified eye, unspecified eyelid: Secondary | ICD-10-CM | POA: Diagnosis not present

## 2019-05-21 ENCOUNTER — Other Ambulatory Visit: Payer: Self-pay | Admitting: Family Medicine

## 2019-05-27 DIAGNOSIS — H40003 Preglaucoma, unspecified, bilateral: Secondary | ICD-10-CM | POA: Diagnosis not present

## 2019-06-08 ENCOUNTER — Ambulatory Visit (INDEPENDENT_AMBULATORY_CARE_PROVIDER_SITE_OTHER): Payer: PPO | Admitting: *Deleted

## 2019-06-08 DIAGNOSIS — R55 Syncope and collapse: Secondary | ICD-10-CM

## 2019-06-08 LAB — CUP PACEART REMOTE DEVICE CHECK
Date Time Interrogation Session: 20210124231628
Implantable Pulse Generator Implant Date: 20180705

## 2019-06-12 DIAGNOSIS — H40003 Preglaucoma, unspecified, bilateral: Secondary | ICD-10-CM | POA: Diagnosis not present

## 2019-06-12 LAB — HM DIABETES EYE EXAM

## 2019-06-17 ENCOUNTER — Ambulatory Visit: Payer: PPO | Attending: Internal Medicine

## 2019-06-17 DIAGNOSIS — Z20822 Contact with and (suspected) exposure to covid-19: Secondary | ICD-10-CM

## 2019-06-18 ENCOUNTER — Encounter: Payer: Self-pay | Admitting: *Deleted

## 2019-06-18 LAB — NOVEL CORONAVIRUS, NAA: SARS-CoV-2, NAA: NOT DETECTED

## 2019-07-09 ENCOUNTER — Ambulatory Visit (INDEPENDENT_AMBULATORY_CARE_PROVIDER_SITE_OTHER): Payer: PPO | Admitting: *Deleted

## 2019-07-09 DIAGNOSIS — R55 Syncope and collapse: Secondary | ICD-10-CM

## 2019-07-09 LAB — CUP PACEART REMOTE DEVICE CHECK
Date Time Interrogation Session: 20210224235549
Date Time Interrogation Session: 20210225092729
Implantable Pulse Generator Implant Date: 20180705
Implantable Pulse Generator Implant Date: 20180705

## 2019-07-10 NOTE — Progress Notes (Signed)
ILR Remote 

## 2019-08-09 LAB — CUP PACEART REMOTE DEVICE CHECK
Date Time Interrogation Session: 20210328022533
Implantable Pulse Generator Implant Date: 20180705

## 2019-08-10 ENCOUNTER — Ambulatory Visit (INDEPENDENT_AMBULATORY_CARE_PROVIDER_SITE_OTHER): Payer: PPO | Admitting: *Deleted

## 2019-08-10 DIAGNOSIS — R55 Syncope and collapse: Secondary | ICD-10-CM

## 2019-08-10 NOTE — Progress Notes (Signed)
ILR Remote 

## 2019-09-02 DIAGNOSIS — R55 Syncope and collapse: Secondary | ICD-10-CM | POA: Diagnosis not present

## 2019-09-02 DIAGNOSIS — R001 Bradycardia, unspecified: Secondary | ICD-10-CM | POA: Diagnosis not present

## 2019-09-02 DIAGNOSIS — I480 Paroxysmal atrial fibrillation: Secondary | ICD-10-CM | POA: Diagnosis not present

## 2019-09-02 DIAGNOSIS — E119 Type 2 diabetes mellitus without complications: Secondary | ICD-10-CM | POA: Diagnosis not present

## 2019-09-02 DIAGNOSIS — I4510 Unspecified right bundle-branch block: Secondary | ICD-10-CM | POA: Diagnosis not present

## 2019-09-02 DIAGNOSIS — I10 Essential (primary) hypertension: Secondary | ICD-10-CM | POA: Diagnosis not present

## 2019-09-09 NOTE — Progress Notes (Signed)
Trena Platt Eleno Weimar,acting as a scribe for [name], MD.,have documented all relevant documentation on the behalf of [name], MD,as directed by  [name], MD while in the presence of [name], MD.   Established patient visit   Patient: Roberto Leonard   DOB: 02-18-1941   79 y.o. Male  MRN: [id number] Visit Date: 09/14/2019  Today's healthcare provider: [name], MD   Chief Complaint  Patient presents with  . Diabetes Mellitus  . Hypertension   Subjective    HPI  Sugar has been good.  He thinks he has been in atrial fib a couple of times but does not last long.  Heart rate gets up in the low 100s during that time. Diabetes Mellitus Type II, follow-up  Lab Results  Component Value Date   HGBA1C 6.0 (A) 09/14/2019   HGBA1C 6.1 (H) 03/17/2019   HGBA1C 6.0 (A) 09/16/2018   Last seen for diabetes 5 months ago.  Management since then includes continuing the same treatment. He reports good compliance with treatment. He is not having side effects.   Home blood sugar records: not checking  Episodes of hypoglycemia? No    Current insulin regiment:  Most Recent Eye Exam: 05/2019  ----------------------------------------------------------------------------------------- Hypertension, follow-up  BP Readings from Last 3 Encounters:  09/14/19 114/69  03/16/19 (!) 142/78  12/31/18 132/76   He was last seen for hypertension 5 months ago.  BP at that visit was 142/78. Management since that visit includes; continue current medication. He reports good compliance with treatment. He is not having side effects.  He is exercising. He is not adherent to low salt diet.   Outside blood pressures are not checking.  He does not smoke.  Use of agents associated with hypertension: none.   ----------------------------------------------------------------------------------------- Lipid/Cholesterol, follow-up  Last Lipid Panel: Lab  Results  Component Value Date   CHOL 175 03/17/2019   LDLCALC 105 (H) 03/17/2019   HDL 39 (L) 03/17/2019   TRIG 176 (H) 03/17/2019   ALT 56 (H) 03/17/2019   AST 37 03/17/2019   PLT 168 03/17/2019    He was last seen for this 5 months ago.  Management since that visit includes; labs checked showing-stable. He reports good compliance with treatment. He is not having side effects.  He is following a Regular diet. Current exercise: walking  Wt Readings from Last 3 Encounters:  09/14/19 215 lb 9.6 oz (97.8 kg)  03/16/19 215 lb 6.4 oz (97.7 kg)  12/31/18 213 lb 6.4 oz (96.8 kg)   Last metabolic panel Lab Results  Component Value Date   GLUCOSE 115 (H) 03/17/2019   NA 137 03/17/2019   K 4.3 03/17/2019   BUN 16 03/17/2019   CREATININE 0.84 03/17/2019   GFRNONAA 83 03/17/2019   GFRAA 96 03/17/2019   CALCIUM 9.3 03/17/2019   AST 37 03/17/2019   ALT 56 (H) 03/17/2019   The 10-year ASCVD risk score Mikey Bussing DC Jr., et al., 2013) is: 50.8%  -----------------------------------------------------------------------------------------  Patient having some recurrence of Atrial Fibrillation.   Past Medical History:  Diagnosis Date  . Allergy   . Detached retina    RIGHT  . Diabetes mellitus without complication (HCC)    ORAL MED  . Dupuytren's contracture   . ED (erectile dysfunction)   . GERD (gastroesophageal reflux disease)   . Glaucoma   . Hyperlipidemia   . Hypertension        Medications: Outpatient Medications Prior to Visit  Medication Sig  . brimonidine (ALPHAGAN) 0.2 % ophthalmic solution Place 1 drop into both eyes 2 (two) times daily.  . Cetirizine HCl 10 MG CAPS Take 10 mg by mouth daily.   . cholecalciferol (VITAMIN D) 1000 units tablet Take 1,000 Units by mouth daily.  Marland Kitchen diltiazem (TIAZAC) 180 MG 24 hr capsule Take 180 mg daily by mouth.  . dorzolamide-timolol (COSOPT) 22.3-6.8 MG/ML ophthalmic solution Place 1 drop into both eyes 2 (two) times daily.  Marland Kitchen  ELIQUIS 5 MG TABS tablet TAKE 1 TABLET BY MOUTH TWICE A DAY  . losartan (COZAAR) 100 MG tablet TAKE 1 TABLET (100 MG TOTAL) BY MOUTH DAILY IN THE MORNING  . Multiple Vitamin (MULTIVITAMIN WITH MINERALS) TABS tablet Take 1 tablet by mouth daily.  . Omega-3 Fatty Acids (FISH OIL BURP-LESS) 1200 MG CAPS Take 2 capsules by mouth every evening.   Marland Kitchen omeprazole (PRILOSEC) 20 MG capsule TAKE 1 CAPSULE BY MOUTH EVERY DAY  . pioglitazone (ACTOS) 30 MG tablet TAKE 1 TABLET BY MOUTH EVERY DAY IN THE MORNING  . simvastatin (ZOCOR) 20 MG tablet Take 1 tablet (20 mg total) by mouth at bedtime.  . [DISCONTINUED] glucose blood test strip Use with meter to test daily and as needfed   No facility-administered medications prior to visit.    Review of Systems  Constitutional: Negative for appetite change, chills and fever.  HENT: Negative.   Eyes: Negative.   Respiratory: Negative for chest tightness, shortness of breath and wheezing.   Cardiovascular: Negative for chest pain and palpitations.  Gastrointestinal: Negative for abdominal pain, nausea and vomiting.  Endocrine: Negative.   Genitourinary: Negative.   Musculoskeletal: Negative.   Allergic/Immunologic: Negative.   Hematological: Negative.   Psychiatric/Behavioral: Negative.     Last hemoglobin A1c Lab Results  Component Value Date   HGBA1C 6.0 (A) 09/14/2019      Objective    BP 114/69 (BP Location: Left Arm, Patient Position: Sitting, Cuff Size: Normal)   Pulse 60   Temp (!) 96.6 F (35.9 C) (Temporal)   Wt 215 lb 9.6 oz (97.8 kg)   BMI 27.68 kg/m  BP Readings from Last 3 Encounters:  09/14/19 114/69  03/16/19 (!) 142/78  12/31/18 132/76   Wt Readings from Last 3 Encounters:  09/14/19 215 lb 9.6 oz (97.8 kg)  03/16/19 215 lb 6.4 oz (97.7 kg)  12/31/18 213 lb 6.4 oz (96.8 kg)      Physical Exam Constitutional:      Appearance: Normal appearance.  Eyes:     General: No scleral icterus.    Conjunctiva/sclera: Conjunctivae  normal.  Neck:     Vascular: No carotid bruit.  Cardiovascular:     Rate and Rhythm: Normal rate and regular rhythm.     Pulses: Normal pulses.     Heart sounds: Normal heart sounds.  Pulmonary:     Effort: Pulmonary effort is normal.     Breath sounds: Normal breath sounds.  Musculoskeletal:     Right lower leg: No edema.     Left lower leg: No edema.  Skin:    General: Skin is warm and dry.  Neurological:     Mental Status: He is alert.  Psychiatric:        Mood and Affect: Mood normal.        Behavior: Behavior normal.       Results for orders placed or performed in visit on 09/14/19  CUP PACEART REMOTE DEVICE CHECK  Result Value Ref Range  Date Time Interrogation Session [id number]    Pulse Generator Manufacturer MERM    Pulse Gen Model [id number] Reveal LINQ    Pulse Gen Serial Number [id number]    Clinic Name [facility]    Implantable Pulse Generator Type ICM/ILR    Implantable Pulse Generator Implant Date [id number]   Results for orders placed or performed in visit on 09/14/19  POCT HgB A1C  Result Value Ref Range   Hemoglobin A1C 6.0 (A) 4.0 - 5.6 %   Estimated Average Glucose 126     Assessment & Plan    1. Type 2 diabetes mellitus with diabetic neuropathic arthropathy, without long-term current use of insulin (HCC) Under control, follow up in 6 months.  Patient is on pioglitazone only - POCT HgB A1C--6.0 today - glucose blood test strip; Use with meter to test daily and as needfed  Dispense: 100 each; Refill: 12  2. Essential (primary) hypertension Under good control, patient will try to work on diet and exercise.  On Cozaar and diltiazem 3. Chronic atrial fibrillation (HCC) On Eliquis Mildly symptomatic at times. 4. Adenomatous polyp Referral placed to Dr. Allen Norris for colonoscopy.  August 2016 was last colonoscopy. - Ambulatory referral to Gastroenterology  5. Hypercholesteremia Under control.  On Zocor. Return in about 6 months (around 03/16/2020)  for Physical.      I, [name], MD, have reviewed all documentation for this visit. The documentation on 09/14/19 for the exam, diagnosis, procedures, and orders are all accurate and complete.    Richard Cranford Mon, MD  [facility] [phone] (phone) [phone] (fax)  [place]

## 2019-09-10 LAB — CUP PACEART REMOTE DEVICE CHECK
Date Time Interrogation Session: 20210428232031
Implantable Pulse Generator Implant Date: 20180705

## 2019-09-14 ENCOUNTER — Ambulatory Visit (INDEPENDENT_AMBULATORY_CARE_PROVIDER_SITE_OTHER): Payer: PPO | Admitting: *Deleted

## 2019-09-14 ENCOUNTER — Other Ambulatory Visit: Payer: Self-pay

## 2019-09-14 ENCOUNTER — Ambulatory Visit (INDEPENDENT_AMBULATORY_CARE_PROVIDER_SITE_OTHER): Payer: PPO | Admitting: Family Medicine

## 2019-09-14 ENCOUNTER — Encounter: Payer: Self-pay | Admitting: Family Medicine

## 2019-09-14 VITALS — BP 114/69 | HR 60 | Temp 96.6°F | Wt 215.6 lb

## 2019-09-14 DIAGNOSIS — D369 Benign neoplasm, unspecified site: Secondary | ICD-10-CM | POA: Diagnosis not present

## 2019-09-14 DIAGNOSIS — I10 Essential (primary) hypertension: Secondary | ICD-10-CM | POA: Diagnosis not present

## 2019-09-14 DIAGNOSIS — E11610 Type 2 diabetes mellitus with diabetic neuropathic arthropathy: Secondary | ICD-10-CM

## 2019-09-14 DIAGNOSIS — I482 Chronic atrial fibrillation, unspecified: Secondary | ICD-10-CM | POA: Diagnosis not present

## 2019-09-14 DIAGNOSIS — E7800 Pure hypercholesterolemia, unspecified: Secondary | ICD-10-CM

## 2019-09-14 DIAGNOSIS — R55 Syncope and collapse: Secondary | ICD-10-CM

## 2019-09-14 LAB — POCT GLYCOSYLATED HEMOGLOBIN (HGB A1C)
Estimated Average Glucose: 126
Hemoglobin A1C: 6 % — AB (ref 4.0–5.6)

## 2019-09-14 MED ORDER — GLUCOSE BLOOD VI STRP: ORAL_STRIP | 12 refills | Status: DC

## 2019-09-15 NOTE — Progress Notes (Signed)
Carelink Summary Report / Loop Recorder 

## 2019-09-21 ENCOUNTER — Telehealth (INDEPENDENT_AMBULATORY_CARE_PROVIDER_SITE_OTHER): Payer: Self-pay | Admitting: Gastroenterology

## 2019-09-21 DIAGNOSIS — D369 Benign neoplasm, unspecified site: Secondary | ICD-10-CM

## 2019-09-21 NOTE — Progress Notes (Signed)
Gastroenterology Pre-Procedure Review  Request Date: Tuesday 10/20/19 Requesting Physician: Dr. Allen Norris  PATIENT REVIEW QUESTIONS: The patient responded to the following health history questions as indicated:    1. Are you having any GI issues? no 2. Do you have a personal history of Polyps? yes (5 years ago with Dr. Allen Norris) 3. Do you have a family history of Colon Cancer or Polyps? no 4. Diabetes Mellitus? yes (type 2) 5. Joint replacements in the past 12 months?no 6. Major health problems in the past 3 months?no 7. Any artificial heart valves, MVP, or defibrillator?no    MEDICATIONS & ALLERGIES:    Patient reports the following regarding taking any anticoagulation/antiplatelet therapy:   Plavix, Coumadin, Eliquis, Xarelto, Lovenox, Pradaxa, Brilinta, or Effient? Eliquis blood thinner request sent to Dr. Josefa Half Aspirin? no  Patient confirms/reports the following medications:  Current Outpatient Medications  Medication Sig Dispense Refill  . brimonidine (ALPHAGAN) 0.2 % ophthalmic solution Place 1 drop into both eyes 2 (two) times daily.  3  . Cetirizine HCl 10 MG CAPS Take 10 mg by mouth daily.     . cholecalciferol (VITAMIN D) 1000 units tablet Take 1,000 Units by mouth daily.    Marland Kitchen diltiazem (CARDIZEM CD) 180 MG 24 hr capsule     . diltiazem (TIAZAC) 180 MG 24 hr capsule Take 180 mg daily by mouth.    . dorzolamide-timolol (COSOPT) 22.3-6.8 MG/ML ophthalmic solution Place 1 drop into both eyes 2 (two) times daily.    Marland Kitchen ELIQUIS 5 MG TABS tablet TAKE 1 TABLET BY MOUTH TWICE A DAY 180 tablet 3  . glucose blood test strip Use with meter to test daily and as needfed 100 each 12  . losartan (COZAAR) 100 MG tablet TAKE 1 TABLET (100 MG TOTAL) BY MOUTH DAILY IN THE MORNING 90 tablet 3  . Multiple Vitamin (MULTIVITAMIN WITH MINERALS) TABS tablet Take 1 tablet by mouth daily.    Marland Kitchen neomycin-polymyxin b-dexamethasone (MAXITROL) 3.5-10000-0.1 SUSP 1 drop 3 (three) times daily.    . Omega-3 Fatty  Acids (FISH OIL BURP-LESS) 1200 MG CAPS Take 2 capsules by mouth every evening.     Marland Kitchen omeprazole (PRILOSEC) 20 MG capsule TAKE 1 CAPSULE BY MOUTH EVERY DAY 90 capsule 2  . pioglitazone (ACTOS) 30 MG tablet TAKE 1 TABLET BY MOUTH EVERY DAY IN THE MORNING 90 tablet 3  . simvastatin (ZOCOR) 20 MG tablet Take 1 tablet (20 mg total) by mouth at bedtime. 90 tablet 3   No current facility-administered medications for this visit.    Patient confirms/reports the following allergies:  Allergies  Allergen Reactions  . Sulfa Antibiotics Other (See Comments)    SWEATS, bad headache SWEATS, bad headache    No orders of the defined types were placed in this encounter.   AUTHORIZATION INFORMATION Primary Insurance: 1D#: Group #:  Secondary Insurance: 1D#: Group #:  SCHEDULE INFORMATION: Date: Tuesday 10/20/19 Time: Location:[facility]

## 2019-09-23 ENCOUNTER — Telehealth: Payer: Self-pay

## 2019-09-23 NOTE — Telephone Encounter (Signed)
Patient has been advised per Dr. Lorinda Creed blood thinner advice to stop Eliquis 3 days prior to colonoscopy and restart 1 day after. Patient verbalized understanding.  Thanks,  [place], [place]

## 2019-10-08 ENCOUNTER — Other Ambulatory Visit: Payer: Self-pay | Admitting: Family Medicine

## 2019-10-13 ENCOUNTER — Ambulatory Visit (INDEPENDENT_AMBULATORY_CARE_PROVIDER_SITE_OTHER): Payer: PPO | Admitting: *Deleted

## 2019-10-13 DIAGNOSIS — R55 Syncope and collapse: Secondary | ICD-10-CM

## 2019-10-13 NOTE — Progress Notes (Signed)
Carelink Summary Report / Loop Recorder 

## 2019-10-14 LAB — CUP PACEART REMOTE DEVICE CHECK
Date Time Interrogation Session: 20210529232832
Implantable Pulse Generator Implant Date: 20180705

## 2019-10-16 ENCOUNTER — Other Ambulatory Visit
Admission: RE | Admit: 2019-10-16 | Discharge: 2019-10-16 | Disposition: A | Discharge status: Home / Self Care | Payer: PPO | Source: Ambulatory Visit | Attending: Gastroenterology | Admitting: Gastroenterology

## 2019-10-16 ENCOUNTER — Other Ambulatory Visit: Payer: Self-pay

## 2019-10-16 DIAGNOSIS — Z20822 Contact with and (suspected) exposure to covid-19: Secondary | ICD-10-CM | POA: Diagnosis not present

## 2019-10-16 DIAGNOSIS — Z01812 Encounter for preprocedural laboratory examination: Secondary | ICD-10-CM | POA: Diagnosis not present

## 2019-10-17 LAB — SARS CORONAVIRUS 2 (TAT 6-24 HRS): SARS Coronavirus 2: NEGATIVE

## 2019-10-19 ENCOUNTER — Other Ambulatory Visit: Payer: Self-pay | Admitting: Family Medicine

## 2019-10-19 DIAGNOSIS — E11610 Type 2 diabetes mellitus with diabetic neuropathic arthropathy: Secondary | ICD-10-CM

## 2019-10-20 ENCOUNTER — Other Ambulatory Visit: Payer: Self-pay

## 2019-10-20 ENCOUNTER — Encounter
Admission: RE | Disposition: A | Discharge status: Home / Self Care | Payer: Self-pay | Source: Home / Self Care | Attending: Gastroenterology

## 2019-10-20 ENCOUNTER — Ambulatory Visit: Payer: PPO | Admitting: Anesthesiology

## 2019-10-20 ENCOUNTER — Ambulatory Visit
Admission: RE | Admit: 2019-10-20 | Discharge: 2019-10-20 | Disposition: A | Discharge status: Home / Self Care | Payer: PPO | Attending: Gastroenterology | Admitting: Gastroenterology

## 2019-10-20 ENCOUNTER — Encounter: Payer: Self-pay | Admitting: Gastroenterology

## 2019-10-20 DIAGNOSIS — I10 Essential (primary) hypertension: Secondary | ICD-10-CM | POA: Insufficient documentation

## 2019-10-20 DIAGNOSIS — Z87891 Personal history of nicotine dependence: Secondary | ICD-10-CM | POA: Insufficient documentation

## 2019-10-20 DIAGNOSIS — K648 Other hemorrhoids: Secondary | ICD-10-CM | POA: Insufficient documentation

## 2019-10-20 DIAGNOSIS — Z79899 Other long term (current) drug therapy: Secondary | ICD-10-CM | POA: Insufficient documentation

## 2019-10-20 DIAGNOSIS — Z7901 Long term (current) use of anticoagulants: Secondary | ICD-10-CM | POA: Insufficient documentation

## 2019-10-20 DIAGNOSIS — Z7984 Long term (current) use of oral hypoglycemic drugs: Secondary | ICD-10-CM | POA: Insufficient documentation

## 2019-10-20 DIAGNOSIS — H409 Unspecified glaucoma: Secondary | ICD-10-CM | POA: Diagnosis not present

## 2019-10-20 DIAGNOSIS — Z1211 Encounter for screening for malignant neoplasm of colon: Secondary | ICD-10-CM | POA: Diagnosis not present

## 2019-10-20 DIAGNOSIS — Z860100 Personal history of colon polyps, unspecified: Secondary | ICD-10-CM

## 2019-10-20 DIAGNOSIS — Z8249 Family history of ischemic heart disease and other diseases of the circulatory system: Secondary | ICD-10-CM | POA: Diagnosis not present

## 2019-10-20 DIAGNOSIS — E785 Hyperlipidemia, unspecified: Secondary | ICD-10-CM | POA: Diagnosis not present

## 2019-10-20 DIAGNOSIS — Z833 Family history of diabetes mellitus: Secondary | ICD-10-CM | POA: Insufficient documentation

## 2019-10-20 DIAGNOSIS — K219 Gastro-esophageal reflux disease without esophagitis: Secondary | ICD-10-CM | POA: Insufficient documentation

## 2019-10-20 DIAGNOSIS — E119 Type 2 diabetes mellitus without complications: Secondary | ICD-10-CM | POA: Insufficient documentation

## 2019-10-20 DIAGNOSIS — Z882 Allergy status to sulfonamides status: Secondary | ICD-10-CM | POA: Diagnosis not present

## 2019-10-20 DIAGNOSIS — D369 Benign neoplasm, unspecified site: Secondary | ICD-10-CM

## 2019-10-20 HISTORY — PX: COLONOSCOPY WITH PROPOFOL: SHX5780

## 2019-10-20 SURGERY — COLONOSCOPY WITH PROPOFOL
Anesthesia: General

## 2019-10-20 MED ORDER — PROPOFOL 500 MG/50ML IV EMUL
INTRAVENOUS | Status: AC
  Filled 2019-10-20: qty 50

## 2019-10-20 MED ORDER — SODIUM CHLORIDE 0.9 % IV SOLN
INTRAVENOUS | Status: DC
  Administered 2019-10-20: 1000 mL via INTRAVENOUS

## 2019-10-20 MED ORDER — PROPOFOL 10 MG/ML IV BOLUS
INTRAVENOUS | Status: DC | PRN
  Administered 2019-10-20: 60 mg via INTRAVENOUS

## 2019-10-20 MED ORDER — LIDOCAINE HCL (PF) 2 % IJ SOLN
INTRAMUSCULAR | Status: AC
  Filled 2019-10-20: qty 10

## 2019-10-20 MED ORDER — PROPOFOL 500 MG/50ML IV EMUL
INTRAVENOUS | Status: DC | PRN
  Administered 2019-10-20: 175 ug/kg/min via INTRAVENOUS

## 2019-10-20 MED ORDER — LIDOCAINE HCL (CARDIAC) PF 100 MG/5ML IV SOSY
PREFILLED_SYRINGE | INTRAVENOUS | Status: DC | PRN
  Administered 2019-10-20: 50 mg via INTRAVENOUS

## 2019-10-20 NOTE — Op Note (Signed)
[facility] Gastroenterology Patient Name: Roberto Leonard Procedure Date: 10/20/2019 9:19 AM MRN: [id number] Account #: [id number] Date of Birth: 1940-12-06 Admit Type: Outpatient Age: 79 Room: [facility] ENDO ROOM 4 Gender: Male Note Status: Finalized Procedure:             Colonoscopy Indications:           High risk colon cancer surveillance: Personal history                         of colonic polyps Providers:             Lucilla Lame MD, MD Medicines:             Propofol per Anesthesia Complications:         No immediate complications. Procedure:             Pre-Anesthesia Assessment:                        - Prior to the procedure, a History and Physical was                         performed, and patient medications and allergies were                         reviewed. The patient's tolerance of previous                         anesthesia was also reviewed. The risks and benefits                         of the procedure and the sedation options and risks                         were discussed with the patient. All questions were                         answered, and informed consent was obtained. Prior                         Anticoagulants: The patient has taken no previous                         anticoagulant or antiplatelet agents. ASA Grade                         Assessment: II - A patient with mild systemic disease.                         After reviewing the risks and benefits, the patient                         was deemed in satisfactory condition to undergo the                         procedure.                        After obtaining informed consent, the colonoscope was  passed under direct vision. Throughout the procedure,                         the patient's blood pressure, pulse, and oxygen                         saturations were monitored continuously. The                         Colonoscope was introduced through the anus  and                         advanced to the the cecum, identified by appendiceal                         orifice and ileocecal valve. The colonoscopy was                         performed without difficulty. The patient tolerated                         the procedure well. The quality of the bowel                         preparation was excellent. Findings:      The perianal and digital rectal examinations were normal.      Non-bleeding internal hemorrhoids were found during retroflexion. The       hemorrhoids were Grade I (internal hemorrhoids that do not prolapse). Impression:            - Non-bleeding internal hemorrhoids.                        - No specimens collected. Recommendation:        - Discharge patient to home.                        - Resume previous diet.                        - Continue present medications. Procedure Code(s):     --- Professional ---                        [phone], Colonoscopy, flexible; diagnostic, including                         collection of specimen(s) by brushing or washing, when                         performed (separate procedure) Diagnosis Code(s):     --- Professional ---                        Z86.010, Personal history of colonic polyps CPT copyright 2019 American Medical Association. All rights reserved. The codes documented in this report are preliminary and upon coder review may  be revised to meet current compliance requirements. Lucilla Lame MD, MD 10/20/2019 9:45:30 AM This report has been signed electronically. Number of Addenda: 0 Note Initiated On: 10/20/2019 9:19 AM Scope Withdrawal Time: 0 hours 8 minutes 36 seconds  Total Procedure Duration: 0 hours 12 minutes 20 seconds  Estimated Blood Loss:  Estimated blood loss: none.      [facility]

## 2019-10-20 NOTE — Anesthesia Preprocedure Evaluation (Signed)
Anesthesia Evaluation  Patient identified by MRN, date of birth, ID band Patient awake    Reviewed: Allergy & Precautions, H&P , NPO status , Patient's Chart, lab work & pertinent test results, reviewed documented beta blocker date and time   History of Anesthesia Complications Negative for: history of anesthetic complications  Airway Mallampati: II  TM Distance: >3 FB Neck ROM: full    Dental  (+) Dental Advidsory Given, Caps, Implants, Missing, Teeth Intact   Pulmonary neg pulmonary ROS, former smoker,    Pulmonary exam normal breath sounds clear to auscultation       Cardiovascular Exercise Tolerance: Good hypertension, (-) angina(-) Past MI and (-) Cardiac Stents + dysrhythmias Atrial Fibrillation (-) Valvular Problems/Murmurs Rhythm:regular Rate:Normal     Neuro/Psych  Neuromuscular disease negative psych ROS   GI/Hepatic Neg liver ROS, GERD  ,  Endo/Other  diabetes  Renal/GU negative Renal ROS  negative genitourinary   Musculoskeletal   Abdominal   Peds  Hematology negative hematology ROS (+)   Anesthesia Other Findings Past Medical History: No date: Allergy No date: Detached retina     Comment:  RIGHT No date: Diabetes mellitus without complication (HCC)     Comment:  ORAL MED No date: Dupuytren's contracture No date: ED (erectile dysfunction) No date: GERD (gastroesophageal reflux disease) No date: Glaucoma No date: Hyperlipidemia No date: Hypertension   Reproductive/Obstetrics negative OB ROS                             Anesthesia Physical Anesthesia Plan  ASA: III  Anesthesia Plan: General   Post-op Pain Management:    Induction: Intravenous  PONV Risk Score and Plan: 2 and Propofol infusion and TIVA  Airway Management Planned: Natural Airway and Nasal Cannula  Additional Equipment:   Intra-op Plan:   Post-operative Plan:   Informed Consent: I have  reviewed the patients History and Physical, chart, labs and discussed the procedure including the risks, benefits and alternatives for the proposed anesthesia with the patient or authorized representative who has indicated his/her understanding and acceptance.     Dental Advisory Given  Plan Discussed with: Anesthesiologist, CRNA and Surgeon  Anesthesia Plan Comments:         Anesthesia Quick Evaluation

## 2019-10-20 NOTE — Anesthesia Procedure Notes (Signed)
Date/Time: 10/20/2019 9:29 AM Performed by: Johnna Acosta, CRNA Pre-anesthesia Checklist: Patient identified, Emergency Drugs available, Suction available, Patient being monitored and Timeout performed Patient Re-evaluated:Patient Re-evaluated prior to induction Oxygen Delivery Method: Nasal cannula Preoxygenation: Pre-oxygenation with 100% oxygen Induction Type: IV induction

## 2019-10-20 NOTE — Transfer of Care (Signed)
Immediate Anesthesia Transfer of Care Note  Patient: Roberto Leonard  Procedure(s) Performed: COLONOSCOPY WITH PROPOFOL (N/A )  Patient Location: PACU  Anesthesia Type:General  Level of Consciousness: sedated  Airway & Oxygen Therapy: Patient Spontanous Breathing  Post-op Assessment: Report given to RN and Post -op Vital signs reviewed and stable  Post vital signs: Reviewed and stable  Last Vitals:  Vitals Value Taken Time  BP 154/126 10/20/19 0950  Temp    Pulse 57 10/20/19 0950  Resp 14 10/20/19 0950  SpO2 98 % 10/20/19 0950    Last Pain:  Vitals:   10/20/19 0837  TempSrc: Temporal  PainSc: 0-No pain         Complications: No apparent anesthesia complications

## 2019-10-20 NOTE — H&P (Signed)
Lucilla Lame, MD [address] [place], [place] [address] Phone:[phone] Fax : [phone]  Primary Care Physician:  [name]., MD Primary Gastroenterologist:  Dr. Allen Norris  Pre-Procedure History & Physical: HPI:  Roberto Leonard is a 79 y.o. male is here for an colonoscopy.   Past Medical History:  Diagnosis Date  . Allergy   . Detached retina    RIGHT  . Diabetes mellitus without complication (HCC)    ORAL MED  . Dupuytren's contracture   . ED (erectile dysfunction)   . GERD (gastroesophageal reflux disease)   . Glaucoma   . Hyperlipidemia   . Hypertension     Past Surgical History:  Procedure Laterality Date  . CATARACT EXTRACTION     right  . COLONOSCOPY WITH PROPOFOL N/A 01/03/2015   Procedure: COLONOSCOPY WITH PROPOFOL;  Surgeon: Lucilla Lame, MD;  Location: [place];  Service: Endoscopy;  Laterality: N/A;  DIABETIC-ORAL MEDS  . EYE SURGERY    . HERNIA REPAIR    . LOOP RECORDER INSERTION N/A 11/15/2016   Procedure: Loop Recorder Insertion;  Surgeon: Deboraha Sprang, MD;  Location: [facility];  Service: Cardiovascular;  Laterality: N/A;  . POLYPECTOMY N/A 01/03/2015   Procedure: POLYPECTOMY;  Surgeon: Lucilla Lame, MD;  Location: [place];  Service: Endoscopy;  Laterality: N/A;  Sigmoid polyp  . RETINAL DETACHMENT SURGERY     right eye, wrong size lense, secondary surgery to replace it    Prior to Admission medications   Medication Sig Start Date End Date Taking? Authorizing Provider  brimonidine (ALPHAGAN) 0.2 % ophthalmic solution Place 1 drop into both eyes 2 (two) times daily. 04/11/15  Yes [provider]  Cetirizine HCl 10 MG CAPS Take 10 mg by mouth daily.  03/26/11  Yes [provider]  cholecalciferol (VITAMIN D) 1000 units tablet Take 1,000 Units by mouth daily.   Yes [provider]  diltiazem (CARDIZEM CD) 180 MG 24 hr capsule  06/18/19  Yes [provider]    diltiazem (TIAZAC) 180 MG 24 hr capsule Take 180 mg daily by mouth.   Yes [provider]  dorzolamide-timolol (COSOPT) 22.3-6.8 MG/ML ophthalmic solution Place 1 drop into both eyes 2 (two) times daily.   Yes [provider]  glucose blood test strip Use with meter to test daily and as needfed 09/14/19  Yes [name]., MD  losartan (COZAAR) 100 MG tablet TAKE 1 TABLET (100 MG TOTAL) BY MOUTH DAILY IN THE MORNING 04/26/19  Yes [name]., MD  Multiple Vitamin (MULTIVITAMIN WITH MINERALS) TABS tablet Take 1 tablet by mouth daily.   Yes [provider]  neomycin-polymyxin b-dexamethasone (MAXITROL) 3.5-10000-0.1 SUSP 1 drop 3 (three) times daily. 05/13/19  Yes [provider]  Omega-3 Fatty Acids (FISH OIL BURP-LESS) 1200 MG CAPS Take 2 capsules by mouth every evening.  03/26/11  Yes [provider]  pioglitazone (ACTOS) 30 MG tablet TAKE 1 TABLET BY MOUTH EVERY DAY IN THE MORNING 10/19/19  Yes [name]., MD  simvastatin (ZOCOR) 20 MG tablet TAKE 1 TABLET BY MOUTH EVERYDAY AT BEDTIME 10/08/19  Yes [name]., MD  ELIQUIS 5 MG TABS tablet TAKE 1 TABLET BY MOUTH TWICE A DAY 11/06/18   [name]., MD  omeprazole (PRILOSEC) 20 MG capsule TAKE 1 CAPSULE BY MOUTH EVERY DAY 05/21/19   [name]., MD    Allergies as of  09/21/2019 - Review Complete 09/21/2019  Allergen Reaction Noted  . Sulfa antibiotics Other (See Comments) 09/24/2014    Family History  Problem Relation Age of Onset  . Heart disease Mother        Died from CHF  . Diabetes Mother   . Diabetes Father   . Hypertension Father   . Heart disease Father        plaque build up  . Diabetes Brother   . Diabetes Daughter        type 1    Social History   Socioeconomic History  . Marital status: Married    Spouse name: Pricilla  . Number of children: 3  . Years of education: college  . Highest education level: Bachelor's  degree (e.g., BA, AB, BS)  Occupational History  . Occupation: Retired  . 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 Smoking status: Former Smoker    Packs/day: 0.50    Years: 2.00    Pack years: 1.00    Types: Cigarettes    Quit date: 05/14/1963    Years since quitting: 56.4  . Smokeless tobacco: Never Used  Substance and Sexual Activity  . Alcohol use: No    Alcohol/week: 0.0 standard drinks  . Drug use: No  . Sexual activity: Not on file  Other Topics Concern  . Not on file  Social History Narrative  . Not on file   Social Determinants of Health   Financial Resource Strain:   . Difficulty of Paying Living Expenses:   Food Insecurity:   . Worried About Charity fundraiser in the Last Year:   . Arboriculturist in the Last Year:   Transportation Needs:   . Film/video editor (Medical):   Marland Kitchen Lack of Transportation (Non-Medical):   Physical Activity: Sufficiently Active  . Days of Exercise per Week: 7 days  . Minutes of Exercise per Session: 30 min  Stress:   . Feeling of Stress :   Social Connections: Unknown  . Frequency of Communication with Friends and Family: Patient refused  . Frequency of Social Gatherings with Friends and Family: Patient refused  . Attends Religious Services: Patient refused  . Active Member of Clubs or Organizations: Patient refused  . Attends Archivist Meetings: Patient refused  . Marital Status: Patient refused  Intimate Partner Violence: Unknown  . Fear of Current or Ex-Partner: Patient refused  . Emotionally Abused: Patient refused  . Physically Abused: Patient refused  . Sexually Abused: Patient refused    Review of Systems: See HPI, otherwise negative ROS  Physical Exam: BP 131/69   Pulse (!) 57   Temp (!) 96.5 F (35.8 C) (Temporal)   Resp 18   Ht 6\' 2"  (1.88 m)   Wt 89.1 kg   SpO2 100%   BMI 25.22 kg/m  General:   Alert,   pleasant and cooperative in NAD Head:  Normocephalic and atraumatic. Neck:  Supple; no masses or thyromegaly. Lungs:  Clear throughout to auscultation.    Heart:  Regular rate and rhythm. Abdomen:  Soft, nontender and nondistended. Normal bowel sounds, without guarding, and without rebound.   Neurologic:  Alert and  oriented x4;  grossly normal neurologically.  Impression/Plan: Roberto Leonard is here for an colonoscopy to be performed for history of adenomatous polyps in August 2016  Risks, benefits, limitations, and alternatives regarding  colonoscopy  have been reviewed with the patient.  Questions have been answered.  All parties agreeable.   Lucilla Lame, MD  10/20/2019, 8:51 AM

## 2019-10-20 NOTE — Anesthesia Postprocedure Evaluation (Signed)
Anesthesia Post Note  Patient: RAYCE BRAHMBHATT  Procedure(s) Performed: COLONOSCOPY WITH PROPOFOL (N/A )  Patient location during evaluation: Endoscopy Anesthesia Type: General Level of consciousness: awake and alert Pain management: pain level controlled Vital Signs Assessment: post-procedure vital signs reviewed and stable Respiratory status: spontaneous breathing, nonlabored ventilation, respiratory function stable and patient connected to nasal cannula oxygen Cardiovascular status: blood pressure returned to baseline and stable Postop Assessment: no apparent nausea or vomiting Anesthetic complications: no     Last Vitals:  Vitals:   10/20/19 0837 10/20/19 0950  BP: 131/69 (!) 154/126  Pulse: (!) 57 (!) 57  Resp: 18 14  Temp: (!) 35.8 C (!) 35.9 C  SpO2: 100% 98%    Last Pain:  Vitals:   10/20/19 1020  TempSrc:   PainSc: 0-No pain                 Martha Clan

## 2019-10-21 ENCOUNTER — Encounter: Payer: Self-pay | Admitting: *Deleted

## 2019-11-07 ENCOUNTER — Other Ambulatory Visit: Payer: Self-pay | Admitting: Family Medicine

## 2019-11-13 ENCOUNTER — Ambulatory Visit (INDEPENDENT_AMBULATORY_CARE_PROVIDER_SITE_OTHER): Payer: PPO | Admitting: *Deleted

## 2019-11-13 DIAGNOSIS — R55 Syncope and collapse: Secondary | ICD-10-CM

## 2019-11-13 LAB — CUP PACEART REMOTE DEVICE CHECK
Date Time Interrogation Session: 20210701230947
Implantable Pulse Generator Implant Date: 20180705

## 2019-11-17 NOTE — Progress Notes (Signed)
Carelink Summary Report / Loop Recorder 

## 2019-12-06 IMAGING — CT CT ABD-PELV W/O CM
2 of 4 series · 16 of 46 positions shown, 18 images · non-contrast
Comparison: None.

CLINICAL DATA: Constipation.

EXAM:
CT ABDOMEN AND PELVIS WITHOUT CONTRAST
TECHNIQUE: Multidetector CT imaging of the abdomen and pelvis was performed
following the standard protocol without IV contrast.

[Series 2: routine abd/pel wo · axial · 0.81mm/px · z∈[-720,-250]mm · 13 of 104 slices shown, 15 images]
[im 5/104  soft-tissue]
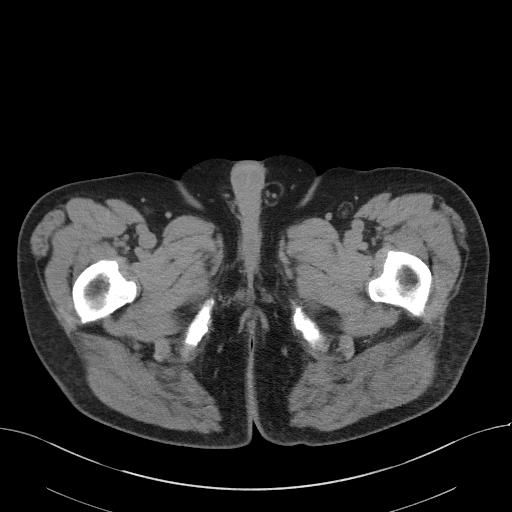
[im 5/104  bone]
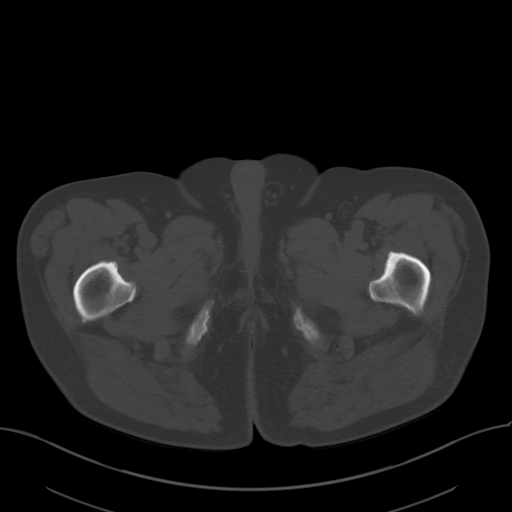
[im 13/104  soft-tissue]
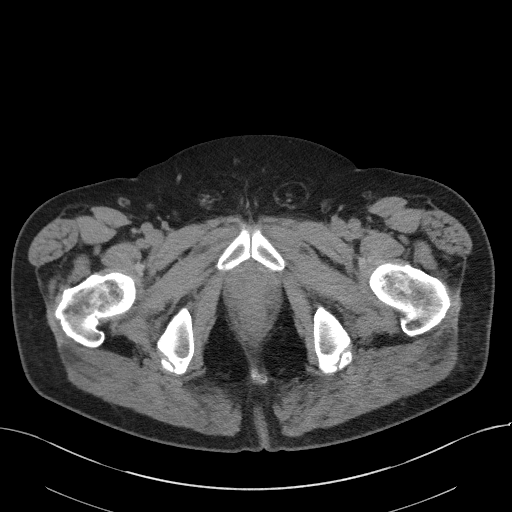
[im 22/104  soft-tissue]
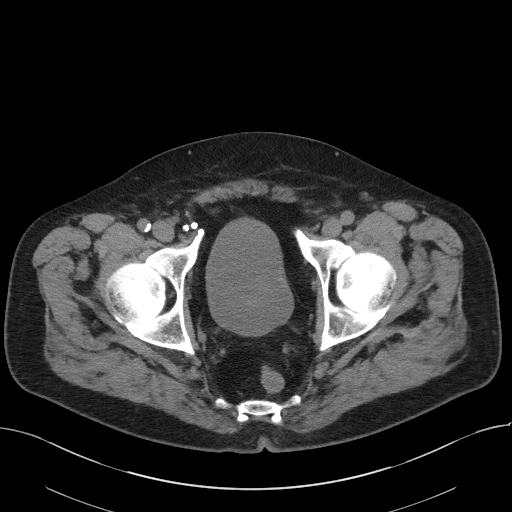
[im 31/104  soft-tissue]
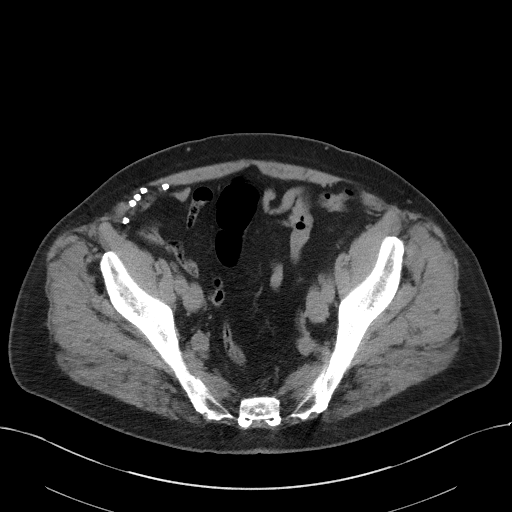
[im 35/104  soft-tissue]
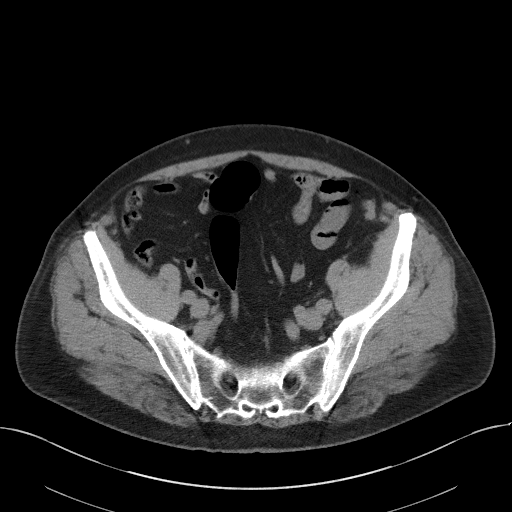
[im 43/104  soft-tissue]
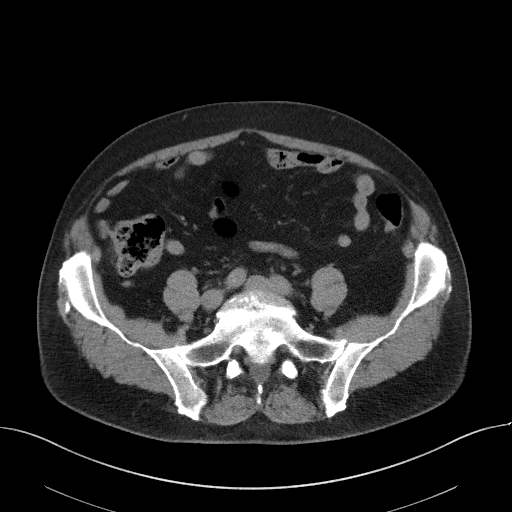
[im 52/104  soft-tissue]
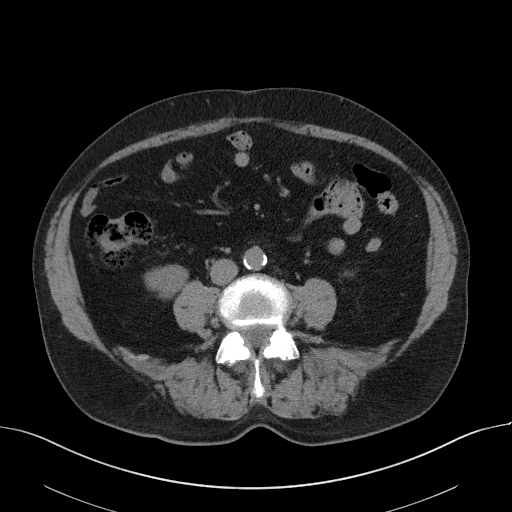
[im 61/104  soft-tissue]
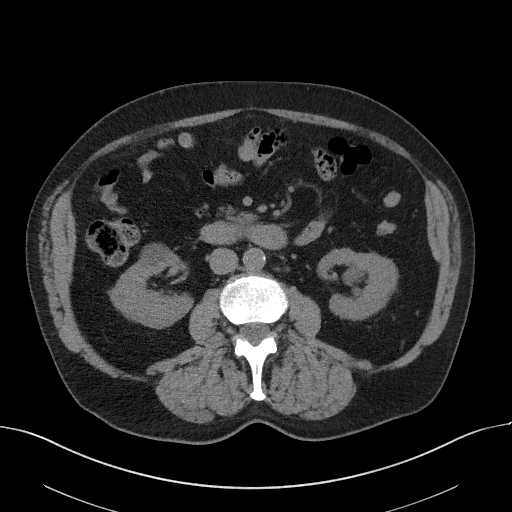
[im 69/104  soft-tissue]
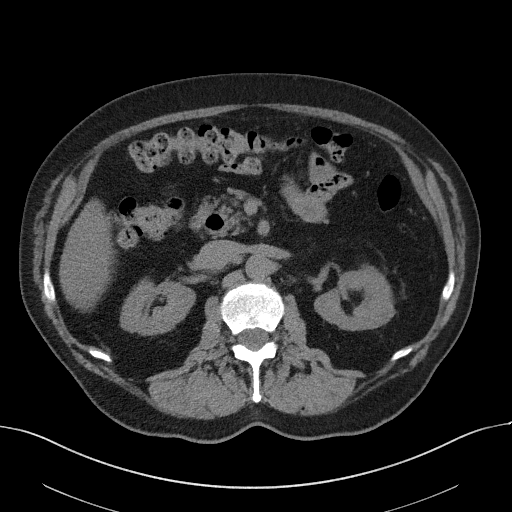
[im 69/104  bone]
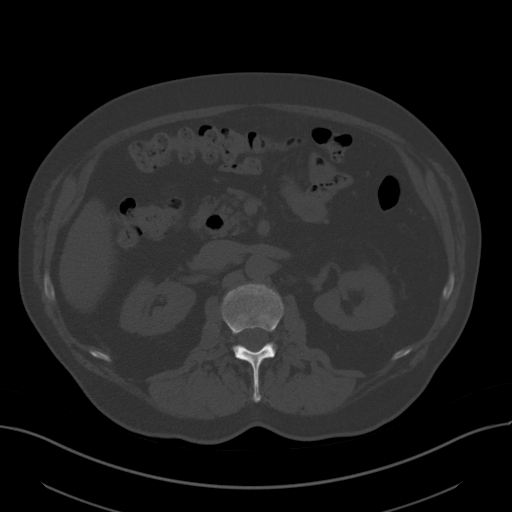
[im 73/104  soft-tissue]
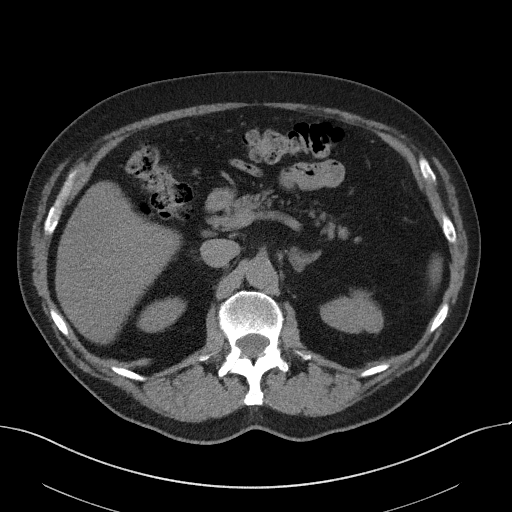
[im 82/104  soft-tissue]
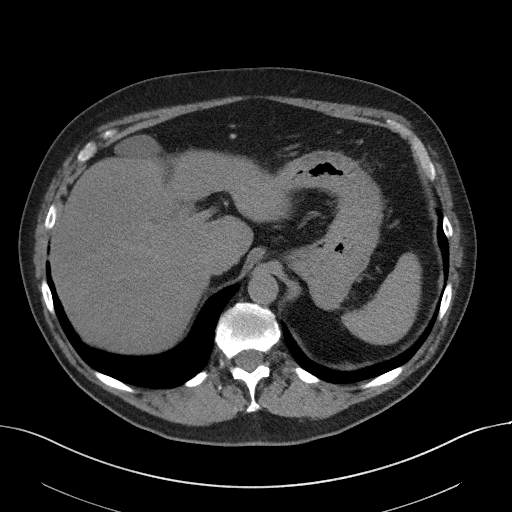
[im 91/104  soft-tissue]
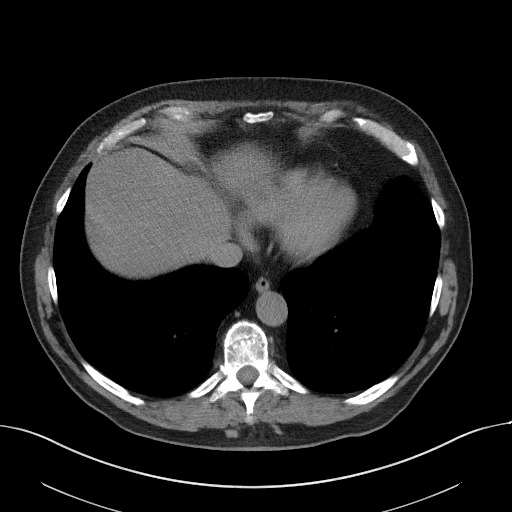
[im 99/104  soft-tissue]
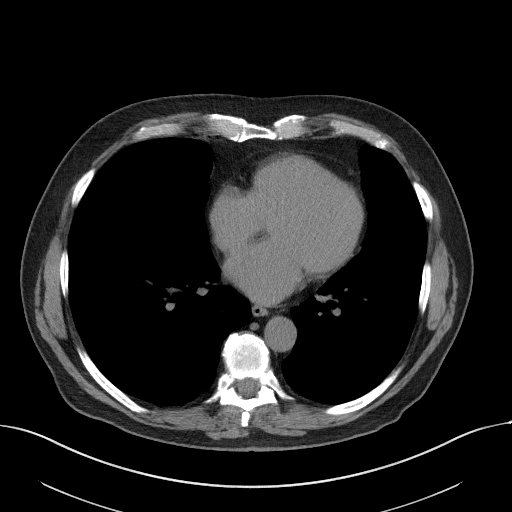

[Series 5: coronal st · coronal · 0.81mm/px · 3 of 89 slices shown]
[im 30/89  soft-tissue]
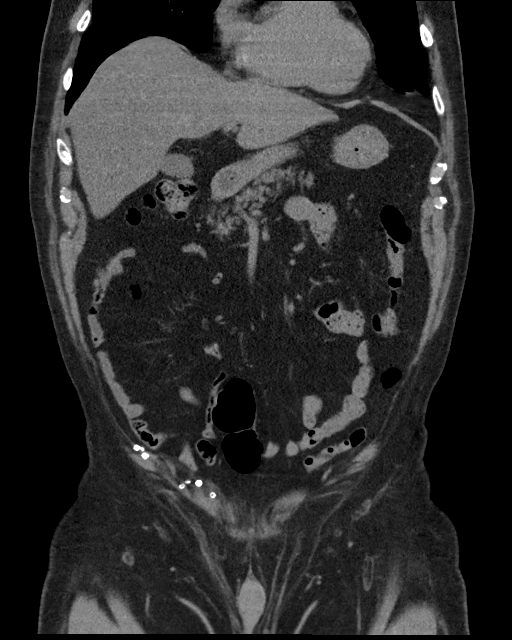
[im 40/89  soft-tissue]
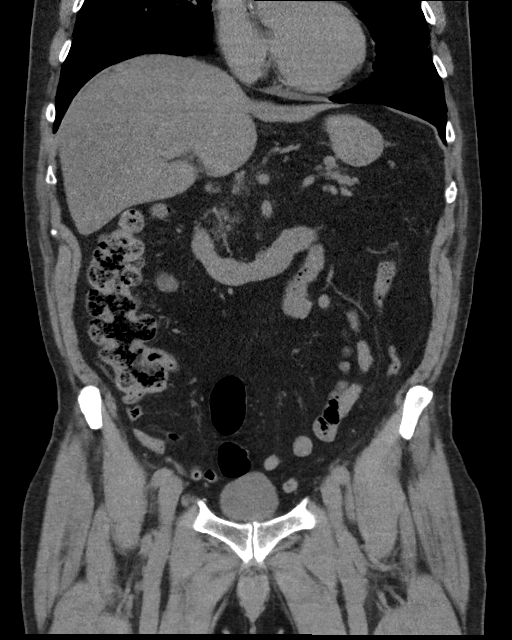
[im 49/89  soft-tissue]
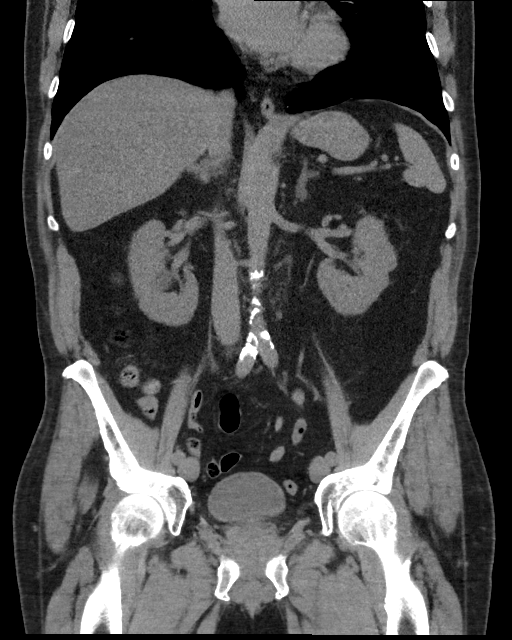

[16 of 46 positions shown; findings below may reference images not displayed]

FINDINGS: Lower chest: No acute abnormality.

Hepatobiliary: No gallstones are noted. Fatty infiltration of the
liver is noted. No biliary dilatation is noted.

Pancreas: Unremarkable. No pancreatic ductal dilatation or
surrounding inflammatory changes.

Spleen: Normal in size without focal abnormality.

Adrenals/Urinary Tract: Adrenal glands are unremarkable. Bilateral
renal cysts are noted. No hydronephrosis or renal obstruction is
noted. No renal or ureteral calculi are noted. Urinary bladder is
unremarkable.

Stomach/Bowel: The stomach and appendix are unremarkable. There is
no evidence of bowel obstruction or inflammation. Mild amount of
stool is noted in the right and transverse colon.

Vascular/Lymphatic: Aortic atherosclerosis. No enlarged abdominal or
pelvic lymph nodes.

Reproductive: Moderate prostatic enlargement is noted.

Other: Surgical clips are seen in right lower quadrant consistent
with prior hernia repair. No abnormal fluid collection is noted.

Musculoskeletal: No acute or significant osseous findings.
IMPRESSION: Fatty infiltration of the liver.

Moderate prostatic enlargement.

Mild amount of stool seen in the right and transverse colon. There
is no evidence of bowel obstruction or inflammation.

Aortic Atherosclerosis (ZOIOL-L45.5).

## 2019-12-21 ENCOUNTER — Ambulatory Visit (INDEPENDENT_AMBULATORY_CARE_PROVIDER_SITE_OTHER): Payer: PPO | Admitting: *Deleted

## 2019-12-21 DIAGNOSIS — R55 Syncope and collapse: Secondary | ICD-10-CM

## 2019-12-21 LAB — CUP PACEART REMOTE DEVICE CHECK
Date Time Interrogation Session: 20210803231554
Implantable Pulse Generator Implant Date: 20180705

## 2019-12-22 NOTE — Progress Notes (Signed)
Carelink Summary Report / Loop Recorder 

## 2019-12-23 ENCOUNTER — Telehealth: Payer: Self-pay

## 2019-12-23 NOTE — Telephone Encounter (Signed)
I let the pt know his monitor is program to send automatically and he do not need to send manually unless the nurse ask him to.

## 2019-12-31 NOTE — Progress Notes (Signed)
Subjective:   Roberto Leonard is a 79 y.o. male who presents for Medicare Annual/Subsequent preventive examination.  I connected with Roberto Leonard today by telephone and verified that I am speaking with the correct person using two identifiers. Location patient: home Location provider: work Persons participating in the virtual visit: patient, provider.   I discussed the limitations, risks, security and privacy concerns of performing an evaluation and management service by telephone and the availability of in person appointments. I also discussed with the patient that there may be a patient responsible charge related to this service. The patient expressed understanding and verbally consented to this telephonic visit.    Interactive audio and video telecommunications were attempted between this provider and patient, however failed, due to patient having technical difficulties OR patient did not have access to video capability.  We continued and completed visit with audio only.   Review of Systems    N/A  Cardiac Risk Factors include: advanced age (>22men, >31 women);diabetes mellitus;hypertension;male gender     Objective:    There were no vitals filed for this visit. There is no height or weight on file to calculate BMI.  Advanced Directives 01/04/2020 10/20/2019 12/31/2018 12/23/2017 11/15/2016 11/11/2016 05/16/2016  Does Patient Have a Medical Advance Directive? Yes Yes Yes Yes No Yes Yes  Type of Paramedic of [place];Living will - [place];Living will [place];Living will - [place];Living will [place];Living will  Does patient want to make changes to medical advance directive? - - - - - No - Patient declined -  Copy of [place] in Chart? No - copy requested - No - copy requested No - copy requested - No - copy requested -  Would patient like information on  creating a medical advance directive? - - - - No - Patient declined - -    Current Medications (verified) Outpatient Encounter Medications as of 01/04/2020  Medication Sig  . brimonidine (ALPHAGAN) 0.2 % ophthalmic solution Place 1 drop into both eyes 2 (two) times daily.  . Cetirizine HCl 10 MG CAPS Take 10 mg by mouth daily.   . cholecalciferol (VITAMIN D) 1000 units tablet Take 1,000 Units by mouth daily.  Marland Kitchen diltiazem (CARDIZEM CD) 180 MG 24 hr capsule Take 180 mg by mouth daily.   . dorzolamide-timolol (COSOPT) 22.3-6.8 MG/ML ophthalmic solution Place 1 drop into both eyes 2 (two) times daily.  Marland Kitchen ELIQUIS 5 MG TABS tablet TAKE 1 TABLET BY MOUTH TWICE A DAY  . glucose blood test strip Use with meter to test daily and as needfed  . losartan (COZAAR) 100 MG tablet TAKE 1 TABLET (100 MG TOTAL) BY MOUTH DAILY IN THE MORNING  . Multiple Vitamin (MULTIVITAMIN WITH MINERALS) TABS tablet Take 1 tablet by mouth daily.  . Omega-3 Fatty Acids (FISH OIL BURP-LESS) 1200 MG CAPS Take 2 capsules by mouth every evening.   . pioglitazone (ACTOS) 30 MG tablet TAKE 1 TABLET BY MOUTH EVERY DAY IN THE MORNING  . simvastatin (ZOCOR) 20 MG tablet TAKE 1 TABLET BY MOUTH EVERYDAY AT BEDTIME  . diltiazem (TIAZAC) 180 MG 24 hr capsule Take 180 mg daily by mouth.  . neomycin-polymyxin b-dexamethasone (MAXITROL) 3.5-10000-0.1 SUSP 1 drop 3 (three) times daily. (Patient not taking: Reported on 01/04/2020)  . omeprazole (PRILOSEC) 20 MG capsule TAKE 1 CAPSULE BY MOUTH EVERY DAY (Patient not taking: Reported on 01/04/2020)   No facility-administered encounter medications on  file as of 01/04/2020.    Allergies (verified) Sulfa antibiotics   History: Past Medical History:  Diagnosis Date  . Allergy   . Detached retina    RIGHT  . Diabetes mellitus without complication (HCC)    ORAL MED  . Dupuytren's contracture   . ED (erectile dysfunction)   . GERD (gastroesophageal reflux disease)   . Glaucoma   .  Hyperlipidemia   . Hypertension    Past Surgical History:  Procedure Laterality Date  . CATARACT EXTRACTION     right  . COLONOSCOPY WITH PROPOFOL N/A 01/03/2015   Procedure: COLONOSCOPY WITH PROPOFOL;  Surgeon: Lucilla Lame, MD;  Location: [facility];  Service: Endoscopy;  Laterality: N/A;  DIABETIC-ORAL MEDS  . COLONOSCOPY WITH PROPOFOL N/A 10/20/2019   Procedure: COLONOSCOPY WITH PROPOFOL;  Surgeon: Lucilla Lame, MD;  [facility] ENDOSCOPY;  Service: Endoscopy;  Laterality: N/A;  . EYE SURGERY    . HERNIA REPAIR    . LOOP RECORDER INSERTION N/A 11/15/2016   Procedure: Loop Recorder Insertion;  Surgeon: Deboraha Sprang, MD;  Location: [facility] CV LAB;  Service: Cardiovascular;  Laterality: N/A;  . POLYPECTOMY N/A 01/03/2015   Procedure: POLYPECTOMY;  Surgeon: Lucilla Lame, MD;  Location: [facility];  Service: Endoscopy;  Laterality: N/A;  Sigmoid polyp  . RETINAL DETACHMENT SURGERY     right eye, wrong size lense, secondary surgery to replace it   Family History  Problem Relation Age of Onset  . Heart disease Mother        Died from CHF  . Diabetes Mother   . Diabetes Father   . Hypertension Father   . Heart disease Father        plaque build up  . Diabetes Brother   . Diabetes Daughter        type 1   Social History   Socioeconomic History  . Marital status: Married    Spouse name: Pricilla  . Number of children: 3  . Years of education: college  . Highest education level: Bachelor's degree (e.g., BA, AB, BS)  Occupational History  . Occupation: Retired  . 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 Smoking status: Former Smoker    Packs/day: 0.50    Years: 2.00    Pack years: 1.00    Types: Cigarettes    Quit date: 05/14/1963    Years since quitting: 56.6  . Smokeless tobacco: Never Used  Vaping Use  . Vaping Use: Never used  Substance and Sexual Activity  .  Alcohol use: No    Alcohol/week: 0.0 standard drinks  . Drug use: No  . Sexual activity: Not on file  Other Topics Concern  . Not on file  Social History Narrative  . Not on file   Social Determinants of Health   Financial Resource Strain: Low Risk   . Difficulty of Paying Living Expenses: Not hard at all  Food Insecurity: No Food Insecurity  . Worried About Charity fundraiser in the Last Year: Never true  . Ran Out of Food in the Last Year: Never true  Transportation Needs: No Transportation Needs  . Lack of Transportation (Medical): No  . Lack of Transportation (Non-Medical): No  Physical Activity: Sufficiently Active  . Days of Exercise per Week: 5 days  . Minutes of Exercise per Session: 30 min  Stress: No Stress Concern Present  .  Feeling of Stress : Not at all  Social Connections: Moderately Integrated  . Frequency of Communication with Friends and Family: Three times a week  . Frequency of Social Gatherings with Friends and Family: More than three times a week  . Attends Religious Services: More than 4 times per year  . Active Member of Clubs or Organizations: No  . Attends Archivist Meetings: Never  . Marital Status: Married    Tobacco Counseling Counseling given: Not Answered   Clinical Intake:  Pre-visit preparation completed: Yes  Pain : No/denies pain     Nutritional Risks: None Diabetes: Yes  How often do you need to have someone help you when you read instructions, pamphlets, or other written materials from your doctor or pharmacy?: 1 - Never  Diabetic? Yes  Nutrition Risk Assessment:  Has the patient had any N/V/D within the last 2 months?  No  Does the patient have any non-healing wounds?  No  Has the patient had any unintentional weight loss or weight gain?  No   Diabetes:  Is the patient diabetic?  Yes  If diabetic, was a CBG obtained today?  No  Did the patient bring in their glucometer from home?  No  How often do you  monitor your CBG's? Once a month.   Financial Strains and Diabetes Management:  Are you having any financial strains with the device, your supplies or your medication? No .  Does the patient want to be seen by Chronic Care Management for management of their diabetes?  No  Would the patient like to be referred to a Nutritionist or for Diabetic Management?  No   Diabetic Exams:  Diabetic Eye Exam: Completed 06/12/19 Diabetic Foot Exam: Overdue, Pt has been advised about the importance in completing this exam. Note made to follow up on this at next in office apt.   Interpreter Needed?: No  Information entered by :: [facility], LPN   Activities of Daily Living In your present state of health, do you have any difficulty performing the following activities: 01/04/2020  Hearing? N  Vision? N  Difficulty concentrating or making decisions? N  Walking or climbing stairs? N  Dressing or bathing? N  Doing errands, shopping? N  Preparing Food and eating ? N  Using the Toilet? N  In the past six months, have you accidently leaked urine? N  Do you have problems with loss of bowel control? N  Managing your Medications? N  Managing your Finances? N  Housekeeping or managing your Housekeeping? N  Some recent data might be hidden    Patient Care Team: [name]., MD as PCP - General (Family Medicine) Deboraha Sprang, MD as Consulting Physician (Cardiology) Isaias Cowman, MD as Consulting Physician (Cardiology) Dingeldein, Remo Lipps, MD (Ophthalmology)  Indicate any recent Medical Services you may have received from other than [facility] providers in the past year (date may be approximate).     Assessment:   This is a routine wellness examination for Bayani.  Hearing/Vision screen No exam data present  Dietary issues and exercise activities discussed: Current Exercise Habits: Home exercise routine, Type of exercise: walking, Time (Minutes): 30, Frequency (Times/Week): 5, Weekly  Exercise (Minutes/Week): 150, Intensity: Mild, Exercise limited by: None identified  Goals    . DIET - INCREASE WATER INTAKE     Recommend increasing water intake to 4-6 glasses a day.       Depression Screen PHQ 2/9 Scores 01/04/2020 12/31/2018 12/23/2017 09/30/2017 05/16/2016  PHQ -  2 Score 0 0 0 0 0    Fall Risk Fall Risk  01/04/2020 03/16/2019 12/31/2018 12/23/2017 09/30/2017  Falls in the past year? 0 1 0 No No  Number falls in past yr: 0 0 - - -  Injury with Fall? 0 0 - - -  Follow up - Falls evaluation completed - - -    Any stairs in or around the home? Yes  If so, are there any without handrails? No  Home free of loose throw rugs in walkways, pet beds, electrical cords, etc? Yes  Adequate lighting in your home to reduce risk of falls? Yes   ASSISTIVE DEVICES UTILIZED TO PREVENT FALLS:  Life alert? No  Use of a cane, walker or w/c? No  Grab bars in the bathroom? Yes  Shower chair or bench in shower? Yes  Elevated toilet seat or a handicapped toilet? Yes    Cognitive Function: Declined today.     6CIT Screen 12/31/2018 12/23/2017  What Year? 0 points 0 points  What month? 0 points 0 points  What time? 0 points 0 points  Count back from 20 0 points 0 points  Months in reverse 0 points 0 points  Repeat phrase 0 points 0 points  Total Score 0 0    Immunizations Immunization History  Administered Date(s) Administered  . Hepatitis B, adult 05/16/2016, 08/29/2016, 11/19/2016  . Influenza Split 01/27/2016  . Influenza, High Dose Seasonal PF 02/10/2015, 01/15/2018, 12/25/2018  . Influenza-Unspecified 01/29/2017, 01/15/2018, 12/25/2018  . PFIZER SARS-COV-2 Vaccination 05/18/2019, 06/08/2019  . Pneumococcal Conjugate-13 01/12/2014  . Pneumococcal Polysaccharide-23 06/23/1998, 03/08/2005  . Td 05/21/2006, 09/16/2018  . Zoster 09/12/2005  . Zoster Recombinat (Shingrix) 08/30/2016, 11/10/2016    TDAP status: Up to date Flu Vaccine status: Up to date Pneumococcal vaccine  status: Up to date Covid-19 vaccine status: Completed vaccines  Qualifies for Shingles Vaccine? Yes   Zostavax completed Yes   Shingrix Completed?: Yes  Screening Tests Health Maintenance  Topic Date Due  . FOOT EXAM  05/16/2017  . INFLUENZA VACCINE  12/13/2019  . HEMOGLOBIN A1C  03/16/2020  . OPHTHALMOLOGY EXAM  06/11/2020  . COLONOSCOPY  10/19/2024  . TETANUS/TDAP  09/15/2028  . COVID-19 Vaccine  Completed  . PNA vac Low Risk Adult  Completed    Health Maintenance  Health Maintenance Due  Topic Date Due  . FOOT EXAM  05/16/2017  . INFLUENZA VACCINE  12/13/2019    Colorectal cancer screening: Completed 10/20/19. Repeat every 5 years  Lung Cancer Screening: (Low Dose CT Chest recommended if Age 10-80 years, 30 pack-year currently smoking OR have quit w/in 15years.) does not qualify.    Additional Screening:  Vision Screening: Recommended annual ophthalmology exams for early detection of glaucoma and other disorders of the eye. Is the patient up to date with their annual eye exam?  Yes  Who is the provider or what is the name of the office in which the patient attends annual eye exams? Dr Sandra Cockayne at [facility] If pt is not established with a provider, would they like to be referred to a provider to establish care? No .   Dental Screening: Recommended annual dental exams for proper oral hygiene  Community Resource Referral / Chronic Care Management: CRR required this visit?  No   CCM required this visit?  No      Plan:     I have personally reviewed and noted the following in the patient's chart:   . Medical and social history .  Use of alcohol, tobacco or illicit drugs  . Current medications and supplements . Functional ability and status . Nutritional status . Physical activity . Advanced directives . List of other physicians . Hospitalizations, surgeries, and ER visits in previous 12 months . Vitals . Screenings to include cognitive, depression, and  falls . Referrals and appointments  In addition, I have reviewed and discussed with patient certain preventive protocols, quality metrics, and best practice recommendations. A written personalized care plan for preventive services as well as general preventive health recommendations were provided to patient.     Dimetri Armitage [place], [place]   [birth year]01   Nurse Notes: Pt needs a diabetic foot exam and flu vaccine at next in office apt.

## 2020-01-04 ENCOUNTER — Other Ambulatory Visit: Payer: Self-pay

## 2020-01-04 ENCOUNTER — Telehealth: Payer: Self-pay

## 2020-01-04 ENCOUNTER — Ambulatory Visit (INDEPENDENT_AMBULATORY_CARE_PROVIDER_SITE_OTHER): Payer: PPO

## 2020-01-04 DIAGNOSIS — Z0000 Encounter for general adult medical examination without abnormal findings: Secondary | ICD-10-CM

## 2020-01-04 NOTE — Telephone Encounter (Signed)
Spoke with pt today and completed his telephonic AWV. Pt wanted to know if getting the flu shot would affect him getting the Covid booster? Pt stated that if he had to pick from the two he would want the Covid booster. Pt would like to know what the time frame would be to wait in between if allowed to get both. Advised pt that I did not have the education on the booster at this time and that I would check with PCP. Please advise, thank you.

## 2020-01-04 NOTE — Patient Instructions (Signed)
Roberto Leonard , Thank you for taking time to come for your Medicare Wellness Visit. I appreciate your ongoing commitment to your health goals. Please review the following plan we discussed and let me know if I can assist you in the future.   Screening recommendations/referrals: Colonoscopy: Up to date, due 10/2024 Recommended yearly ophthalmology/optometry visit for glaucoma screening and checkup Recommended yearly dental visit for hygiene and checkup  Vaccinations: Influenza vaccine: Due fall 2021 Pneumococcal vaccine: Completed series Tdap vaccine: Up to date, due 09/2028 Shingles vaccine: Completed series    Advanced directives: Please bring a copy of your POA (Power of [place]) and/or Living Will to your next appointment.   Conditions/risks identified: Recommend increasing water intake to 6-8 8 oz glasses a day.   Next appointment: 03/23/20 @ 9:00 AM with Dr Rosanna Randy   Preventive Care 80 Years and Older, Male Preventive care refers to lifestyle choices and visits with your health care provider that can promote health and wellness. What does preventive care include?  A yearly physical exam. This is also called an annual well check.  Dental exams once or twice a year.  Routine eye exams. Ask your health care provider how often you should have your eyes checked.  Personal lifestyle choices, including:  Daily care of your teeth and gums.  Regular physical activity.  Eating a healthy diet.  Avoiding tobacco and drug use.  Limiting alcohol use.  Practicing safe sex.  Taking low doses of aspirin every day.  Taking vitamin and mineral supplements as recommended by your health care provider. What happens during an annual well check? The services and screenings done by your health care provider during your annual well check will depend on your age, overall health, lifestyle risk factors, and family history of disease. Counseling  Your health care provider may ask you  questions about your:  Alcohol use.  Tobacco use.  Drug use.  Emotional well-being.  Home and relationship well-being.  Sexual activity.  Eating habits.  History of falls.  Memory and ability to understand (cognition).  Work and work Statistician. Screening  You may have the following tests or measurements:  Height, weight, and BMI.  Blood pressure.  Lipid and cholesterol levels. These may be checked every 5 years, or more frequently if you are over 99 years old.  Skin check.  Lung cancer screening. You may have this screening every year starting at age 40 if you have a 30-pack-year history of smoking and currently smoke or have quit within the past 15 years.  Fecal occult blood test (FOBT) of the stool. You may have this test every year starting at age 11.  Flexible sigmoidoscopy or colonoscopy. You may have a sigmoidoscopy every 5 years or a colonoscopy every 10 years starting at age 31.  Prostate cancer screening. Recommendations will vary depending on your family history and other risks.  Hepatitis C blood test.  Hepatitis B blood test.  Sexually transmitted disease (STD) testing.  Diabetes screening. This is done by checking your blood sugar (glucose) after you have not eaten for a while (fasting). You may have this done every 1-3 years.  Abdominal aortic aneurysm (AAA) screening. You may need this if you are a current or former smoker.  Osteoporosis. You may be screened starting at age 80 if you are at high risk. Talk with your health care provider about your test results, treatment options, and if necessary, the need for more tests. Vaccines  Your health care provider may recommend certain  vaccines, such as:  Influenza vaccine. This is recommended every year.  Tetanus, diphtheria, and acellular pertussis (Tdap, Td) vaccine. You may need a Td booster every 10 years.  Zoster vaccine. You may need this after age 77.  Pneumococcal 13-valent conjugate  (PCV13) vaccine. One dose is recommended after age 26.  Pneumococcal polysaccharide (PPSV23) vaccine. One dose is recommended after age 52. Talk to your health care provider about which screenings and vaccines you need and how often you need them. This information is not intended to replace advice given to you by your health care provider. Make sure you discuss any questions you have with your health care provider. Document Released: 05/27/2015 Document Revised: 01/18/2016 Document Reviewed: 03/01/2015 Elsevier Interactive Patient Education  2017 [place] Prevention in the Home Falls can cause injuries. They can happen to people of all ages. There are many things you can do to make your home safe and to help prevent falls. What can I do on the outside of my home?  Regularly fix the edges of walkways and driveways and fix any cracks.  Remove anything that might make you trip as you walk through a door, such as a raised step or threshold.  Trim any bushes or trees on the path to your home.  Use bright outdoor lighting.  Clear any walking paths of anything that might make someone trip, such as rocks or tools.  Regularly check to see if handrails are loose or broken. Make sure that both sides of any steps have handrails.  Any raised decks and porches should have guardrails on the edges.  Have any leaves, snow, or ice cleared regularly.  Use sand or salt on walking paths during winter.  Clean up any spills in your garage right away. This includes oil or grease spills. What can I do in the bathroom?  Use night lights.  Install grab bars by the toilet and in the tub and shower. Do not use towel bars as grab bars.  Use non-skid mats or decals in the tub or shower.  If you need to sit down in the shower, use a plastic, non-slip stool.  Keep the floor dry. Clean up any water that spills on the floor as soon as it happens.  Remove soap buildup in the tub or shower  regularly.  Attach bath mats securely with double-sided non-slip rug tape.  Do not have throw rugs and other things on the floor that can make you trip. What can I do in the bedroom?  Use night lights.  Make sure that you have a light by your bed that is easy to reach.  Do not use any sheets or blankets that are too big for your bed. They should not hang down onto the floor.  Have a firm chair that has side arms. You can use this for support while you get dressed.  Do not have throw rugs and other things on the floor that can make you trip. What can I do in the kitchen?  Clean up any spills right away.  Avoid walking on wet floors.  Keep items that you use a lot in easy-to-reach places.  If you need to reach something above you, use a strong step stool that has a grab bar.  Keep electrical cords out of the way.  Do not use floor polish or wax that makes floors slippery. If you must use wax, use non-skid floor wax.  Do not have throw rugs and other things  on the floor that can make you trip. What can I do with my stairs?  Do not leave any items on the stairs.  Make sure that there are handrails on both sides of the stairs and use them. Fix handrails that are broken or loose. Make sure that handrails are as long as the stairways.  Check any carpeting to make sure that it is firmly attached to the stairs. Fix any carpet that is loose or worn.  Avoid having throw rugs at the top or bottom of the stairs. If you do have throw rugs, attach them to the floor with carpet tape.  Make sure that you have a light switch at the top of the stairs and the bottom of the stairs. If you do not have them, ask someone to add them for you. What else can I do to help prevent falls?  Wear shoes that:  Do not have high heels.  Have rubber bottoms.  Are comfortable and fit you well.  Are closed at the toe. Do not wear sandals.  If you use a stepladder:  Make sure that it is fully  opened. Do not climb a closed stepladder.  Make sure that both sides of the stepladder are locked into place.  Ask someone to hold it for you, if possible.  Clearly mark and make sure that you can see:  Any grab bars or handrails.  First and last steps.  Where the edge of each step is.  Use tools that help you move around (mobility aids) if they are needed. These include:  Canes.  Walkers.  Scooters.  Crutches.  Turn on the lights when you go into a dark area. Replace any light bulbs as soon as they burn out.  Set up your furniture so you have a clear path. Avoid moving your furniture around.  If any of your floors are uneven, fix them.  If there are any pets around you, be aware of where they are.  Review your medicines with your doctor. Some medicines can make you feel dizzy. This can increase your chance of falling. Ask your doctor what other things that you can do to help prevent falls. This information is not intended to replace advice given to you by your health care provider. Make sure you discuss any questions you have with your health care provider. Document Released: 02/24/2009 Document Revised: 10/06/2015 Document Reviewed: 06/04/2014 Elsevier Interactive Patient Education  2017 [employer].

## 2020-01-06 NOTE — Telephone Encounter (Signed)
Patient advised.

## 2020-01-06 NOTE — Telephone Encounter (Signed)
2 to 4 weeks.

## 2020-01-14 DIAGNOSIS — D2262 Melanocytic nevi of left upper limb, including shoulder: Secondary | ICD-10-CM | POA: Diagnosis not present

## 2020-01-14 DIAGNOSIS — Z85828 Personal history of other malignant neoplasm of skin: Secondary | ICD-10-CM | POA: Diagnosis not present

## 2020-01-14 DIAGNOSIS — X32XXXA Exposure to sunlight, initial encounter: Secondary | ICD-10-CM | POA: Diagnosis not present

## 2020-01-14 DIAGNOSIS — D2272 Melanocytic nevi of left lower limb, including hip: Secondary | ICD-10-CM | POA: Diagnosis not present

## 2020-01-14 DIAGNOSIS — D225 Melanocytic nevi of trunk: Secondary | ICD-10-CM | POA: Diagnosis not present

## 2020-01-14 DIAGNOSIS — D2261 Melanocytic nevi of right upper limb, including shoulder: Secondary | ICD-10-CM | POA: Diagnosis not present

## 2020-01-14 DIAGNOSIS — L570 Actinic keratosis: Secondary | ICD-10-CM | POA: Diagnosis not present

## 2020-01-20 DIAGNOSIS — H40003 Preglaucoma, unspecified, bilateral: Secondary | ICD-10-CM | POA: Diagnosis not present

## 2020-01-20 LAB — CUP PACEART REMOTE DEVICE CHECK
Date Time Interrogation Session: 20210905233451
Implantable Pulse Generator Implant Date: 20180705

## 2020-01-25 ENCOUNTER — Ambulatory Visit (INDEPENDENT_AMBULATORY_CARE_PROVIDER_SITE_OTHER): Payer: PPO | Admitting: *Deleted

## 2020-01-25 DIAGNOSIS — R55 Syncope and collapse: Secondary | ICD-10-CM

## 2020-01-26 NOTE — Progress Notes (Signed)
Carelink Summary Report / Loop Recorder 

## 2020-02-20 LAB — CUP PACEART REMOTE DEVICE CHECK
Date Time Interrogation Session: 20211008233751
Implantable Pulse Generator Implant Date: 20180705

## 2020-02-29 ENCOUNTER — Ambulatory Visit (INDEPENDENT_AMBULATORY_CARE_PROVIDER_SITE_OTHER): Payer: PPO

## 2020-02-29 DIAGNOSIS — R55 Syncope and collapse: Secondary | ICD-10-CM

## 2020-03-07 NOTE — Progress Notes (Signed)
Carelink Summary Report / Loop Recorder 

## 2020-03-09 DIAGNOSIS — E782 Mixed hyperlipidemia: Secondary | ICD-10-CM | POA: Diagnosis not present

## 2020-03-09 DIAGNOSIS — I480 Paroxysmal atrial fibrillation: Secondary | ICD-10-CM | POA: Diagnosis not present

## 2020-03-09 DIAGNOSIS — I4510 Unspecified right bundle-branch block: Secondary | ICD-10-CM | POA: Diagnosis not present

## 2020-03-09 DIAGNOSIS — Z23 Encounter for immunization: Secondary | ICD-10-CM | POA: Diagnosis not present

## 2020-03-09 DIAGNOSIS — I10 Essential (primary) hypertension: Secondary | ICD-10-CM | POA: Diagnosis not present

## 2020-03-23 ENCOUNTER — Encounter: Payer: Self-pay | Admitting: Family Medicine

## 2020-04-04 ENCOUNTER — Ambulatory Visit (INDEPENDENT_AMBULATORY_CARE_PROVIDER_SITE_OTHER): Payer: PPO

## 2020-04-04 DIAGNOSIS — R55 Syncope and collapse: Secondary | ICD-10-CM | POA: Diagnosis not present

## 2020-04-04 LAB — CUP PACEART REMOTE DEVICE CHECK
Date Time Interrogation Session: 20211121233524
Implantable Pulse Generator Implant Date: 20180705

## 2020-04-06 ENCOUNTER — Other Ambulatory Visit: Payer: Self-pay | Admitting: Family Medicine

## 2020-04-06 NOTE — Telephone Encounter (Signed)
Requested medication (s) are due for refill today: due 04/03/20  Requested medication (s) are on the active medication list:yes  Last refill: 10/08/19 #90 1 refill  Future visit scheduled yes 07/14/20  Notes to clinic: Failed due to labs due  Requested Prescriptions  Pending Prescriptions Disp Refills   simvastatin (ZOCOR) 20 MG tablet [Pharmacy Med Name: SIMVASTATIN 20 MG TABLET] 90 tablet 1    Sig: TAKE 1 TABLET BY MOUTH EVERYDAY AT BEDTIME      Cardiovascular:  Antilipid - Statins Failed - 04/06/2020  1:32 AM      Failed - Total Cholesterol in normal range and within 360 days    Cholesterol, Total  Date Value Ref Range Status  03/17/2019 175 100 - 199 mg/dL Final          Failed - LDL in normal range and within 360 days    LDL Cholesterol (Calc)  Date Value Ref Range Status  03/29/2017 91 mg/dL (calc) Final    Comment:    Reference range: <100 . Desirable range <100 mg/dL for primary prevention;   <70 mg/dL for patients with CHD or diabetic patients  with > or = 2 CHD risk factors. Marland Kitchen LDL-C is now calculated using the Martin-Hopkins  calculation, which is a validated novel method providing  better accuracy than the Friedewald equation in the  estimation of LDL-C.  Cresenciano Genre et al. Annamaria Helling. 4917;915(05): 2061-2068  (http://education.[employer].com/faq/FAQ164)    LDL Chol Calc (NIH)  Date Value Ref Range Status  03/17/2019 105 (H) 0 - 99 mg/dL Final          Failed - HDL in normal range and within 360 days    HDL  Date Value Ref Range Status  03/17/2019 39 (L) >39 mg/dL Final          Failed - Triglycerides in normal range and within 360 days    Triglycerides  Date Value Ref Range Status  03/17/2019 176 (H) 0 - 149 mg/dL Final          Passed - Patient is not pregnant      Passed - Valid encounter within last 12 months    Recent Outpatient Visits           6 months ago Type 2 diabetes mellitus with diabetic neuropathic arthropathy, without  long-term current use of insulin [facility])   [facility] [name]., MD   1 year ago Annual physical exam   [facility] [name]., MD   1 year ago Type 2 diabetes mellitus with diabetic neuropathic arthropathy, without long-term current use of insulin [facility])   [facility] [name]., MD   2 years ago Paronychia of great toe, left   [facility] [place], [place], [place]   2 years ago Type 2 diabetes mellitus with diabetic neuropathic arthropathy, without long-term current use of insulin [facility])   [facility] [name]., MD       Future Appointments             In 3 months [name]., MD [facility]

## 2020-04-11 NOTE — Progress Notes (Signed)
Carelink Summary Report / Loop Recorder 

## 2020-04-14 ENCOUNTER — Other Ambulatory Visit: Payer: Self-pay | Admitting: Family Medicine

## 2020-04-19 ENCOUNTER — Other Ambulatory Visit
Admission: RE | Admit: 2020-04-19 | Discharge: 2020-04-19 | Disposition: A | Discharge status: Home / Self Care | Payer: PPO | Source: Home / Self Care | Attending: Adult Health | Admitting: Adult Health

## 2020-04-19 ENCOUNTER — Encounter: Payer: Self-pay | Admitting: Adult Health

## 2020-04-19 ENCOUNTER — Ambulatory Visit
Admission: RE | Admit: 2020-04-19 | Discharge: 2020-04-19 | Disposition: A | Discharge status: Home / Self Care | Payer: PPO | Source: Ambulatory Visit | Attending: Adult Health | Admitting: Adult Health

## 2020-04-19 ENCOUNTER — Ambulatory Visit
Admission: RE | Admit: 2020-04-19 | Discharge: 2020-04-19 | Disposition: A | Discharge status: Home / Self Care | Payer: PPO | Attending: Adult Health | Admitting: Adult Health

## 2020-04-19 ENCOUNTER — Other Ambulatory Visit: Payer: Self-pay

## 2020-04-19 ENCOUNTER — Ambulatory Visit (INDEPENDENT_AMBULATORY_CARE_PROVIDER_SITE_OTHER): Payer: PPO | Admitting: Adult Health

## 2020-04-19 VITALS — BP 140/64 | HR 57 | Temp 98.1°F | Resp 15 | Wt 223.4 lb

## 2020-04-19 DIAGNOSIS — K219 Gastro-esophageal reflux disease without esophagitis: Secondary | ICD-10-CM

## 2020-04-19 DIAGNOSIS — R059 Cough, unspecified: Secondary | ICD-10-CM | POA: Insufficient documentation

## 2020-04-19 LAB — CBC WITH DIFFERENTIAL/PLATELET
Abs Immature Granulocytes: 0.03 10*3/uL (ref 0.00–0.07)
Basophils Absolute: 0.1 10*3/uL (ref 0.0–0.1)
Basophils Relative: 1 %
Eosinophils Absolute: 0.2 10*3/uL (ref 0.0–0.5)
Eosinophils Relative: 3 %
HCT: 49.1 % (ref 39.0–52.0)
Hemoglobin: 16.3 g/dL (ref 13.0–17.0)
Immature Granulocytes: 1 %
Lymphocytes Relative: 28 %
Lymphs Abs: 1.9 10*3/uL (ref 0.7–4.0)
MCH: 29.7 pg (ref 26.0–34.0)
MCHC: 33.2 g/dL (ref 30.0–36.0)
MCV: 89.6 fL (ref 80.0–100.0)
Monocytes Absolute: 0.6 10*3/uL (ref 0.1–1.0)
Monocytes Relative: 9 %
Neutro Abs: 3.9 10*3/uL (ref 1.7–7.7)
Neutrophils Relative %: 58 %
Platelets: 169 10*3/uL (ref 150–400)
RBC: 5.48 MIL/uL (ref 4.22–5.81)
RDW: 13.2 % (ref 11.5–15.5)
WBC: 6.6 10*3/uL (ref 4.0–10.5)
nRBC: 0 % (ref 0.0–0.2)

## 2020-04-19 LAB — COMPREHENSIVE METABOLIC PANEL
ALT: 38 U/L (ref 0–44)
AST: 27 U/L (ref 15–41)
Albumin: 4.2 g/dL (ref 3.5–5.0)
Alkaline Phosphatase: 60 U/L (ref 38–126)
Anion gap: 7 (ref 5–15)
BUN: 17 mg/dL (ref 8–23)
CO2: 28 mmol/L (ref 22–32)
Calcium: 9.4 mg/dL (ref 8.9–10.3)
Chloride: 106 mmol/L (ref 98–111)
Creatinine, Ser: 0.76 mg/dL (ref 0.61–1.24)
GFR, Estimated: >60 mL/min (ref >60)
Glucose, Bld: 126 mg/dL — ABNORMAL HIGH (ref 70–99)
Potassium: 4.6 mmol/L (ref 3.5–5.1)
Sodium: 141 mmol/L (ref 135–145)
Total Bilirubin: 0.9 mg/dL (ref 0.3–1.2)
Total Protein: 7.3 g/dL (ref 6.5–8.1)

## 2020-04-19 MED ORDER — OMEPRAZOLE 40 MG PO CPDR: 40.0000 mg | DELAYED_RELEASE_CAPSULE | Freq: Every day | ORAL | 0 refills | Status: DC

## 2020-04-19 NOTE — Patient Instructions (Signed)
Cough, Adult A cough helps to clear your throat and lungs. A cough may be a sign of an illness or another medical condition. An acute cough may only last 2-3 weeks, while a chronic cough may last 8 or more weeks. Many things can cause a cough. They include:  Germs (viruses or bacteria) that attack the airway.  Breathing in things that bother (irritate) your lungs.  Allergies.  Asthma.  Mucus that runs down the back of your throat (postnasal drip).  Smoking.  Acid backing up from the stomach into the tube that moves food from the mouth to the stomach (gastroesophageal reflux).  Some medicines.  Lung problems.  Other medical conditions, such as heart failure or a blood clot in the lung (pulmonary embolism). Follow these instructions at home: Medicines  Take over-the-counter and prescription medicines only as told by your doctor.  Talk with your doctor before you take medicines that stop a cough (coughsuppressants). Lifestyle   Do not smoke, and try not to be around smoke. Do not use any products that contain nicotine or tobacco, such as cigarettes, e-cigarettes, and chewing tobacco. If you need help quitting, ask your doctor.  Drink enough fluid to keep your pee (urine) pale yellow.  Avoid caffeine.  Do not drink alcohol if your doctor tells you not to drink. General instructions   Watch for any changes in your cough. Tell your doctor about them.  Always cover your mouth when you cough.  Stay away from things that make you cough, such as perfume, candles, campfire smoke, or cleaning products.  If the air is dry, use a cool mist vaporizer or humidifier in your home.  If your cough is worse at night, try using extra pillows to raise your head up higher while you sleep.  Rest as needed.  Keep all follow-up visits as told by your doctor. This is important. Contact a doctor if:  You have new symptoms.  You cough up pus.  Your cough does not get better after 2-3  weeks, or your cough gets worse.  Cough medicine does not help your cough and you are not sleeping well.  You have pain that gets worse or pain that is not helped with medicine.  You have a fever.  You are losing weight and you do not know why.  You have night sweats. Get help right away if:  You cough up blood.  You have trouble breathing.  Your heartbeat is very fast. These symptoms may be an emergency. Do not wait to see if the symptoms will go away. Get medical help right away. Call your local emergency services (911 in the [place] Do not drive yourself to the hospital. Summary  A cough helps to clear your throat and lungs. Many things can cause a cough.  Take over-the-counter and prescription medicines only as told by your doctor.  Always cover your mouth when you cough.  Contact a doctor if you have new symptoms or you have a cough that does not get better or gets worse. This information is not intended to replace advice given to you by your health care provider. Make sure you discuss any questions you have with your health care provider. Document Revised: 05/19/2018 Document Reviewed: 05/19/2018 Elsevier Patient Education  [place]. Gastroesophageal Reflux Disease, Adult Gastroesophageal reflux (GER) happens when acid from the stomach flows up into the tube that connects the mouth and the stomach (esophagus). Normally, food travels down the esophagus and stays in the stomach  to be digested. With GER, food and stomach acid sometimes move back up into the esophagus. You may have a disease called gastroesophageal reflux disease (GERD) if the reflux:  Happens often.  Causes frequent or very bad symptoms.  Causes problems such as damage to the esophagus. When this happens, the esophagus becomes sore and swollen (inflamed). Over time, GERD can make small holes (ulcers) in the lining of the esophagus. What are the causes? This condition is caused by a problem with  the muscle between the esophagus and the stomach. When this muscle is weak or not normal, it does not close properly to keep food and acid from coming back up from the stomach. The muscle can be weak because of:  Tobacco use.  Pregnancy.  Having a certain type of hernia (hiatal hernia).  Alcohol use.  Certain foods and drinks, such as coffee, chocolate, onions, and peppermint. What increases the risk? You are more likely to develop this condition if you:  Are overweight.  Have a disease that affects your connective tissue.  Use NSAID medicines. What are the signs or symptoms? Symptoms of this condition include:  Heartburn.  Difficult or painful swallowing.  The feeling of having a lump in the throat.  A bitter taste in the mouth.  Bad breath.  Having a lot of saliva.  Having an upset or bloated stomach.  Belching.  Chest pain. Different conditions can cause chest pain. Make sure you see your doctor if you have chest pain.  Shortness of breath or noisy breathing (wheezing).  Ongoing (chronic) cough or a cough at night.  Wearing away of the surface of teeth (tooth enamel).  Weight loss. How is this treated? Treatment will depend on how bad your symptoms are. Your doctor may suggest:  Changes to your diet.  Medicine.  Surgery. Follow these instructions at home: Eating and drinking   Follow a diet as told by your doctor. You may need to avoid foods and drinks such as: ? Coffee and tea (with or without caffeine). ? Drinks that contain alcohol. ? Energy drinks and sports drinks. ? Bubbly (carbonated) drinks or sodas. ? Chocolate and cocoa. ? Peppermint and mint flavorings. ? Garlic and onions. ? Horseradish. ? Spicy and acidic foods. These include peppers, chili powder, curry powder, vinegar, hot sauces, and BBQ sauce. ? Citrus fruit juices and citrus fruits, such as oranges, lemons, and limes. ? Tomato-based foods. These include red sauce, chili,  salsa, and pizza with red sauce. ? Fried and fatty foods. These include donuts, french fries, potato chips, and high-fat dressings. ? High-fat meats. These include hot dogs, rib eye steak, sausage, ham, and bacon. ? High-fat dairy items, such as whole milk, butter, and cream cheese.  Eat small meals often. Avoid eating large meals.  Avoid drinking large amounts of liquid with your meals.  Avoid eating meals during the 2-3 hours before bedtime.  Avoid lying down right after you eat.  Do not exercise right after you eat. Lifestyle   Do not use any products that contain nicotine or tobacco. These include cigarettes, e-cigarettes, and chewing tobacco. If you need help quitting, ask your doctor.  Try to lower your stress. If you need help doing this, ask your doctor.  If you are overweight, lose an amount of weight that is healthy for you. Ask your doctor about a safe weight loss goal. General instructions  Pay attention to any changes in your symptoms.  Take over-the-counter and prescription medicines only as told  by your doctor. Do not take aspirin, ibuprofen, or other NSAIDs unless your doctor says it is okay.  Wear loose clothes. Do not wear anything tight around your waist.  Raise (elevate) the head of your bed about 6 inches (15 cm).  Avoid bending over if this makes your symptoms worse.  Keep all follow-up visits as told by your doctor. This is important. Contact a doctor if:  You have new symptoms.  You lose weight and you do not know why.  You have trouble swallowing or it hurts to swallow.  You have wheezing or a cough that keeps happening.  Your symptoms do not get better with treatment.  You have a hoarse voice. Get help right away if:  You have pain in your arms, neck, jaw, teeth, or back.  You feel sweaty, dizzy, or light-headed.  You have chest pain or shortness of breath.  You throw up (vomit) and your throw-up looks like blood or coffee  grounds.  You pass out (faint).  Your poop (stool) is bloody or black.  You cannot swallow, drink, or eat. Summary  If a person has gastroesophageal reflux disease (GERD), food and stomach acid move back up into the esophagus and cause symptoms or problems such as damage to the esophagus.  Treatment will depend on how bad your symptoms are.  Follow a diet as told by your doctor.  Take all medicines only as told by your doctor. This information is not intended to replace advice given to you by your health care provider. Make sure you discuss any questions you have with your health care provider. Document Revised: 11/06/2017 Document Reviewed: 11/06/2017 Elsevier Patient Education  2020 [place] for Gastroesophageal Reflux Disease, Adult When you have gastroesophageal reflux disease (GERD), the foods you eat and your eating habits are very important. Choosing the right foods can help ease your discomfort. Think about working with a nutrition specialist (dietitian) to help you make good choices. What are tips for following this plan?  Meals  Choose healthy foods that are low in fat, such as fruits, vegetables, whole grains, low-fat dairy products, and lean meat, fish, and poultry.  Eat small meals often instead of 3 large meals a day. Eat your meals slowly, and in a place where you are relaxed. Avoid bending over or lying down until 2-3 hours after eating.  Avoid eating meals 2-3 hours before bed.  Avoid drinking a lot of liquid with meals.  Cook foods using methods other than frying. Bake, grill, or broil food instead.  Avoid or limit: ? Chocolate. ? Peppermint or spearmint. ? Alcohol. ? Pepper. ? Black and decaffeinated coffee. ? Black and decaffeinated tea. ? Bubbly (carbonated) soft drinks. ? Caffeinated energy drinks and soft drinks.  Limit high-fat foods such as: ? Fatty meat or fried foods. ? Whole milk, cream, butter, or ice cream. ? Nuts and  nut butters. ? Pastries, donuts, and sweets made with butter or shortening.  Avoid foods that cause symptoms. These foods may be different for everyone. Common foods that cause symptoms include: ? Tomatoes. ? Oranges, lemons, and limes. ? Peppers. ? Spicy food. ? Onions and garlic. ? Vinegar. Lifestyle  Maintain a healthy weight. Ask your doctor what weight is healthy for you. If you need to lose weight, work with your doctor to do so safely.  Exercise for at least 30 minutes for 5 or more days each week, or as told by your doctor.  Wear loose-fitting clothes.  Do not smoke. If you need help quitting, ask your doctor.  Sleep with the head of your bed higher than your feet. Use a wedge under the mattress or blocks under the bed frame to raise the head of the bed. Summary  When you have gastroesophageal reflux disease (GERD), food and lifestyle choices are very important in easing your symptoms.  Eat small meals often instead of 3 large meals a day. Eat your meals slowly, and in a place where you are relaxed.  Limit high-fat foods such as fatty meat or fried foods.  Avoid bending over or lying down until 2-3 hours after eating.  Avoid peppermint and spearmint, caffeine, alcohol, and chocolate. This information is not intended to replace advice given to you by your health care provider. Make sure you discuss any questions you have with your health care provider. Document Revised: 08/21/2018 Document Reviewed: 06/05/2016 Elsevier Patient Education  [place].

## 2020-04-19 NOTE — Progress Notes (Addendum)
Virtual telephone visit    Virtual Visit via Telephone Note   This visit type was conducted due to national recommendations for restrictions regarding the COVID-19 Pandemic (e.g. social distancing) in an effort to limit this patient's exposure and mitigate transmission in our community. Due to his co-morbid illnesses, this patient is at least at moderate risk for complications without adequate follow up. This format is felt to be most appropriate for this patient at this time. The patient did not have access to video technology or had technical difficulties with video requiring transitioning to audio format only (telephone). Physical exam was limited to content and character of the telephone converstion.    Parties involved in visit as below:  Telephone visit.   Patient location: in car Provider location: Provider: Provider's office at  [facility], [place].     I discussed the limitations of evaluation and management by telemedicine and the availability of in person appointments. The patient expressed understanding and agreed to proceed.   Visit Date: 04/19/2020  Today's healthcare provider: Marcille Buffy, FNP   Chief Complaint  Patient presents with  . Cough  . Heartburn   Subjective    Heartburn He complains of coughing and heartburn. This is a new problem. The current episode started in the past 7 days. The problem occurs frequently. The problem has been unchanged.  Cough This is a new problem. The current episode started 1 to 4 weeks ago. The problem has been unchanged. The cough is productive of sputum. Associated symptoms include heartburn.    He has taken Tums and gas x with some relief. He gets relief with belching.   He has had cough with thick productive cough.  Clear phlegm.    Denies any hemoptysis, black or tarry stools, denies any rectal bleeding.   denies any trauma, abdominal pain, back pain or shortness of breath. Denies  any chest discomfort.   denies any chest pain, arm pain. He does have loop recorder, no issues in over a year.    Patient  denies any fever, body aches,chills, rash, chest pain, shortness of breath, nausea, vomiting, or diarrhea.  Denies dizziness, lightheadedness, pre syncopal or syncopal episodes.      Patient Active Problem List   Diagnosis Date Noted  . Cough 04/19/2020  . Personal history of colonic polyps   . Pseudophakia of left eye 06/23/2018  . Type 2 diabetes mellitus without complication, without long-term current use of insulin ([place]) 06/16/2018  . Paroxysmal atrial fibrillation ([place]) 12/26/2016  . RBBB 12/26/2016  . Sinus bradycardia 12/26/2016  . Syncope 11/11/2016  . Memory loss 04/30/2015  . Special screening for malignant neoplasms, colon   . Benign neoplasm of sigmoid colon   . Allergic rhinitis 09/24/2014  . 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 Cheilitis 09/24/2014  . Abnormal prostate specific antigen 09/24/2014  . Erectile dysfunction associated with type 2 diabetes mellitus ([place]) 09/24/2014  . 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 Essential (primary) hypertension 09/24/2014  . Gastroesophageal reflux disease 09/24/2014  . Glaucoma 09/24/2014  . Inguinal hernia 09/24/2014  . Hypercholesteremia 09/24/2014  . Detached retina 09/24/2014  . Type 2 diabetes mellitus with diabetic neuropathic arthropathy ([place]) 09/24/2014   Past Medical History:  Diagnosis Date  . Allergy   . Detached retina    RIGHT  . Diabetes mellitus without complication (HCC)    ORAL MED  . Dupuytren's contracture   . ED (  erectile dysfunction)   . GERD (gastroesophageal reflux disease)   . Glaucoma   . Hyperlipidemia   . Hypertension    Past Surgical History:  Procedure Laterality Date  . CATARACT EXTRACTION     right  . COLONOSCOPY WITH PROPOFOL N/A 01/03/2015   Procedure: COLONOSCOPY WITH PROPOFOL;  Surgeon: Lucilla Lame, MD;   Location: [facility];  Service: Endoscopy;  Laterality: N/A;  DIABETIC-ORAL MEDS  . COLONOSCOPY WITH PROPOFOL N/A 10/20/2019   Procedure: COLONOSCOPY WITH PROPOFOL;  Surgeon: Lucilla Lame, MD;  Location[facility] ENDOSCOPY;  Service: Endoscopy;  Laterality: N/A;  . EYE SURGERY    . HERNIA REPAIR    . LOOP RECORDER INSERTION N/A 11/15/2016   Procedure: Loop Recorder Insertion;  Surgeon: Deboraha Sprang, MD;  Location: [facility];  Service: Cardiovascular;  Laterality: N/A;  . POLYPECTOMY N/A 01/03/2015   Procedure: POLYPECTOMY;  Surgeon: Lucilla Lame, MD;  Location: [place] [place];  Service: Endoscopy;  Laterality: N/A;  Sigmoid polyp  . RETINAL DETACHMENT SURGERY     right eye, wrong size lense, secondary surgery to replace it      Medications: Outpatient Medications Prior to Visit  Medication Sig  . brimonidine (ALPHAGAN) 0.2 % ophthalmic solution Place 1 drop into both eyes 2 (two) times daily.  . cholecalciferol (VITAMIN D) 1000 units tablet Take 1,000 Units by mouth daily.  Marland Kitchen diltiazem (CARDIZEM CD) 180 MG 24 hr capsule Take 180 mg by mouth daily.   . dorzolamide-timolol (COSOPT) 22.3-6.8 MG/ML ophthalmic solution Place 1 drop into both eyes 2 (two) times daily.  Marland Kitchen ELIQUIS 5 MG TABS tablet TAKE 1 TABLET BY MOUTH TWICE A DAY  . glucose blood test strip Use with meter to test daily and as needfed  . losartan (COZAAR) 100 MG tablet TAKE 1 TABLET (100 MG TOTAL) BY MOUTH DAILY IN THE MORNING  . Multiple Vitamin (MULTIVITAMIN WITH MINERALS) TABS tablet Take 1 tablet by mouth daily.  Marland Kitchen neomycin-polymyxin b-dexamethasone (MAXITROL) 3.5-10000-0.1 SUSP 1 drop 3 (three) times daily.   . Omega-3 Fatty Acids (FISH OIL BURP-LESS) 1200 MG CAPS Take 2 capsules by mouth every evening.   . pioglitazone (ACTOS) 30 MG tablet TAKE 1 TABLET BY MOUTH EVERY DAY IN THE MORNING  . simvastatin (ZOCOR) 20 MG tablet TAKE 1 TABLET BY MOUTH EVERYDAY AT BEDTIME  . [DISCONTINUED] omeprazole (PRILOSEC)  20 MG capsule TAKE 1 CAPSULE BY MOUTH EVERY DAY  . Cetirizine HCl 10 MG CAPS Take 10 mg by mouth daily.  (Patient not taking: Reported on 04/19/2020)  . diltiazem (TIAZAC) 180 MG 24 hr capsule Take 180 mg daily by mouth. (Patient not taking: Reported on 04/19/2020)   No facility-administered medications prior to visit.    Review of Systems  Respiratory: Positive for cough.   Gastrointestinal: Positive for heartburn.    Last CBC Lab Results  Component Value Date   WBC 6.6 04/19/2020   HGB 16.3 04/19/2020   HCT 49.1 04/19/2020   MCV 89.6 04/19/2020   MCH 29.7 04/19/2020   RDW 13.2 04/19/2020   PLT 169 09/47/0962   Last metabolic panel Lab Results  Component Value Date   GLUCOSE 126 (H) 04/19/2020   NA 141 04/19/2020   K 4.6 04/19/2020   CL 106 04/19/2020   CO2 28 04/19/2020   BUN 17 04/19/2020   CREATININE 0.76 04/19/2020   GFRNONAA >60 04/19/2020   GFRAA 96 03/17/2019   CALCIUM 9.4 04/19/2020   PROT 7.3 04/19/2020   ALBUMIN 4.2  04/19/2020   LABGLOB 2.4 03/17/2019   AGRATIO 1.7 03/17/2019   BILITOT 0.9 04/19/2020   ALKPHOS 60 04/19/2020   AST 27 04/19/2020   ALT 38 04/19/2020   ANIONGAP 7 04/19/2020      Objective    BP 140/64   Pulse (!) 57   Temp 98.1 F (36.7 C) (Oral)   Resp 15   Wt 223 lb 6.4 oz (101.3 kg)   SpO2 98%   BMI 28.68 kg/m  BP Readings from Last 3 Encounters:  04/19/20 140/64  10/20/19 (!) 154/126  09/14/19 114/69   Wt Readings from Last 3 Encounters:  04/19/20 223 lb 6.4 oz (101.3 kg)  10/20/19 196 lb 6.5 oz (89.1 kg)  09/14/19 215 lb 9.6 oz (97.8 kg)    no vitals available.    Patient is alert and oriented and responsive to questions Engages in conversation with provider. Speaks in full sentences without any pauses without any shortness of breath or distress.   Assessment & Plan     Gastroesophageal reflux disease, unspecified whether esophagitis present - Plan: CBC with Differential/Platelet, Comprehensive Metabolic Panel  (CMET)  Cough - Plan: DG Chest 2 View  Orders Placed This Encounter  Procedures  . DG Chest 2 View  . CBC with Differential/Platelet  . Comprehensive Metabolic Panel (CMET)    Meds ordered this encounter  Medications  . omeprazole (PRILOSEC) 40 MG capsule    Sig: Take 1 capsule (40 mg total) by mouth daily.    Dispense:  42 capsule    Refill:  0  will increase Prilosec to 40 mg qd for 3 to 4 weeks, was taking 20 mg qd.   Return in about 3 weeks (around 05/10/2020), or if symptoms worsen or fail to improve, for at any time for any worsening symptoms, Go to Emergency room/ urgent care if worse.    I discussed the assessment and treatment plan with the patient. The patient was provided an opportunity to ask questions and all were answered. The patient agreed with the plan and demonstrated an understanding of the instructions.   The patient was advised to call back or seek an in-person evaluation if the symptoms worsen or if the condition fails to improve as anticipated.   I discussed the limitations of evaluation and management by telemedicine and the availability of in person appointments. The patient expressed understanding and agreed to proceed.   . I spent 25 minutes dedicated to the care of this patient on  the date of this encounter to include pre-visit review of records,telephone  time with the  patient discussing The primary encounter diagnosis was Gastroesophageal reflux disease, unspecified whether esophagitis present. A diagnosis of Cough was also pertinent to this visit.  and post  Telephone visit ordering of testing.  Marcille Buffy, [place] [phone] (phone) [phone] (fax)  [place]

## 2020-04-19 NOTE — Progress Notes (Signed)
No acute findings in labs, CBC within normal limits.  CMP within normal limits except glucose elevated, patient likely not fasting- verify ?

## 2020-04-19 NOTE — Progress Notes (Signed)
Chest x ray within normal limits,loop recorder seen.

## 2020-05-09 ENCOUNTER — Ambulatory Visit (INDEPENDENT_AMBULATORY_CARE_PROVIDER_SITE_OTHER): Payer: PPO

## 2020-05-09 DIAGNOSIS — R55 Syncope and collapse: Secondary | ICD-10-CM | POA: Diagnosis not present

## 2020-05-10 ENCOUNTER — Ambulatory Visit (INDEPENDENT_AMBULATORY_CARE_PROVIDER_SITE_OTHER): Payer: PPO | Admitting: Family Medicine

## 2020-05-10 ENCOUNTER — Ambulatory Visit
Admission: RE | Admit: 2020-05-10 | Discharge: 2020-05-10 | Disposition: A | Discharge status: Home / Self Care | Payer: PPO | Source: Ambulatory Visit | Attending: Family Medicine | Admitting: Family Medicine

## 2020-05-10 ENCOUNTER — Other Ambulatory Visit: Payer: Self-pay | Admitting: Family Medicine

## 2020-05-10 ENCOUNTER — Encounter: Payer: Self-pay | Admitting: Family Medicine

## 2020-05-10 ENCOUNTER — Ambulatory Visit
Admission: RE | Admit: 2020-05-10 | Discharge: 2020-05-10 | Disposition: A | Discharge status: Home / Self Care | Payer: PPO | Attending: Family Medicine | Admitting: Family Medicine

## 2020-05-10 ENCOUNTER — Other Ambulatory Visit: Payer: Self-pay

## 2020-05-10 VITALS — BP 129/68 | HR 60 | Temp 98.1°F | Ht 74.0 in | Wt 219.0 lb

## 2020-05-10 DIAGNOSIS — G8929 Other chronic pain: Secondary | ICD-10-CM

## 2020-05-10 DIAGNOSIS — M25512 Pain in left shoulder: Secondary | ICD-10-CM | POA: Insufficient documentation

## 2020-05-10 DIAGNOSIS — M19012 Primary osteoarthritis, left shoulder: Secondary | ICD-10-CM | POA: Diagnosis not present

## 2020-05-10 LAB — CUP PACEART REMOTE DEVICE CHECK
Date Time Interrogation Session: 20211224233709
Implantable Pulse Generator Implant Date: 20180705

## 2020-05-10 NOTE — Patient Instructions (Signed)
Shoulder Pain Many things can cause shoulder pain, including:  An injury to the shoulder.  Overuse of the shoulder.  Arthritis. The source of the pain can be:  Inflammation.  An injury to the shoulder joint.  An injury to a tendon, ligament, or bone. Follow these instructions at home: Pay attention to changes in your symptoms. Let your health care provider know about them. Follow these instructions to relieve your pain. If you have a sling:  Wear the sling as told by your health care provider. Remove it only as told by your health care provider.  Loosen the sling if your fingers tingle, become numb, or turn cold and blue.  Keep the sling clean.  If the sling is not waterproof: ? Do not let it get wet. Remove it to shower or bathe.  Move your arm as little as possible, but keep your hand moving to prevent swelling. Managing pain, stiffness, and swelling   If directed, put ice on the painful area: ? Put ice in a plastic bag. ? Place a towel between your skin and the bag. ? Leave the ice on for 20 minutes, 2-3 times per day. Stop applying ice if it does not help with the pain.  Squeeze a soft ball or a foam pad as much as possible. This helps to keep the shoulder from swelling. It also helps to strengthen the arm. General instructions  Take over-the-counter and prescription medicines only as told by your health care provider.  Keep all follow-up visits as told by your health care provider. This is important. Contact a health care provider if:  Your pain gets worse.  Your pain is not relieved with medicines.  New pain develops in your arm, hand, or fingers. Get help right away if:  Your arm, hand, or fingers: ? Tingle. ? Become numb. ? Become swollen. ? Become painful. ? Turn white or blue. Summary  Shoulder pain can be caused by an injury, overuse, or arthritis.  Pay attention to changes in your symptoms. Let your health care provider know about  them.  This condition may be treated with a sling, ice, and pain medicines.  Contact your health care provider if the pain gets worse or new pain develops. Get help right away if your arm, hand, or fingers tingle or become numb, swollen, or painful.  Keep all follow-up visits as told by your health care provider. This is important. This information is not intended to replace advice given to you by your health care provider. Make sure you discuss any questions you have with your health care provider. Document Revised: 11/12/2017 Document Reviewed: 11/12/2017 Elsevier Patient Education  2020 Elsevier Inc.  

## 2020-05-10 NOTE — Progress Notes (Signed)
Established patient visit   Patient: Roberto Leonard   DOB: 10/20/1940   79 y.o. Male  MRN: [id number] Visit Date: 05/10/2020  Today's healthcare provider: Dortha Kern, PA-C   HPI Pt presents with left shoulder pain. Pt reports that he does not recall an injury but says his pain has been going on for 2 weeks. Pt reports pain when lifting arm superiorly and laterally.  Past Medical History:  Diagnosis Date   Allergy    Detached retina    RIGHT   Diabetes mellitus without complication (HCC)    ORAL MED   Dupuytren's contracture    ED (erectile dysfunction)    GERD (gastroesophageal reflux disease)    Glaucoma    Hyperlipidemia    Hypertension    Past Surgical History:  Procedure Laterality Date   CATARACT EXTRACTION     right   COLONOSCOPY WITH PROPOFOL N/A 01/03/2015   Procedure: COLONOSCOPY WITH PROPOFOL;  Surgeon: Midge Minium, MD;  Location: [facility];  Service: Endoscopy;  Laterality: N/A;  DIABETIC-ORAL MEDS   COLONOSCOPY WITH PROPOFOL N/A 10/20/2019   Procedure: COLONOSCOPY WITH PROPOFOL;  Surgeon: Midge Minium, MD;  Location: [facility] END[facility];  Service: Endoscopy;  Laterality: N/A;   EYE SURGERY     HERNIA REPAIR     LOOP RECORDER INSERTION N/A 11/15/2016   Procedure: Loop Recorder Insertion;  Surgeon: Duke Salvia, MD;  Location: [facility] INVASIVE CV LAB;  Service: Cardiovascular;  Laterality: N/A;   POLYPECTOMY N/A 01/03/2015   Procedure: POLYPECTOMY;  Surgeon: Midge Minium, MD;  Location: [facility];  Service: Endoscopy;  Laterality: N/A;  Sigmoid polyp   RETINAL DETACHMENT SURGERY     right eye, wrong size lense, secondary surgery to replace it   Social History   Tobacco Use   Smoking status: Former Smoker    Packs/day: 0.50    Years: 2.00    Pack years: 1.00    Types: Cigarettes    Quit date: 05/14/1963    Years since quitting: 57.0   Smokeless tobacco: Never Used  Vaping Use   Vaping Use: Never used  Substance  Use Topics   Alcohol use: No    Alcohol/week: 0.0 standard drinks   Drug use: No   Family History  Problem Relation Age of Onset   Heart disease Mother        Died from CHF   Diabetes Mother    Diabetes Father    Hypertension Father    Heart disease Father        plaque build up   Diabetes Brother    Diabetes Daughter        type 1   Allergies  Allergen Reactions   Sulfa Antibiotics Other (See Comments)    SWEATS, bad headache SWEATS, bad headache       Medications: Outpatient Medications Prior to Visit  Medication Sig   brimonidine (ALPHAGAN) 0.2 % ophthalmic solution Place 1 drop into both eyes 2 (two) times daily.   Cetirizine HCl 10 MG CAPS Take 10 mg by mouth daily.  (Patient not taking: Reported on 04/19/2020)   cholecalciferol (VITAMIN D) 1000 units tablet Take 1,000 Units by mouth daily.   diltiazem (CARDIZEM CD) 180 MG 24 hr capsule Take 180 mg by mouth daily.    diltiazem (TIAZAC) 180 MG 24 hr capsule Take 180 mg daily by mouth. (Patient not taking: Reported on 04/19/2020)   dorzolamide-timolol (COSOPT) 22.3-6.8 MG/ML ophthalmic solution Place 1 drop into both  eyes 2 (two) times daily.   ELIQUIS 5 MG TABS tablet TAKE 1 TABLET BY MOUTH TWICE A DAY   glucose blood test strip Use with meter to test daily and as needfed   losartan (COZAAR) 100 MG tablet TAKE 1 TABLET (100 MG TOTAL) BY MOUTH DAILY IN THE MORNING   Multiple Vitamin (MULTIVITAMIN WITH MINERALS) TABS tablet Take 1 tablet by mouth daily.   neomycin-polymyxin b-dexamethasone (MAXITROL) 3.5-10000-0.1 SUSP 1 drop 3 (three) times daily.    Omega-3 Fatty Acids (FISH OIL BURP-LESS) 1200 MG CAPS Take 2 capsules by mouth every evening.    omeprazole (PRILOSEC) 40 MG capsule Take 1 capsule (40 mg total) by mouth daily.   pioglitazone (ACTOS) 30 MG tablet TAKE 1 TABLET BY MOUTH EVERY DAY IN THE MORNING   simvastatin (ZOCOR) 20 MG tablet TAKE 1 TABLET BY MOUTH EVERYDAY AT BEDTIME   No  facility-administered medications prior to visit.    Review of Systems  Constitutional: Negative.   HENT: Negative.   Respiratory: Negative.   Musculoskeletal: Positive for arthralgias.       Pain in the left shoulder.      Objective    BP 129/68 (BP Location: Left Arm, Patient Position: Sitting, Cuff Size: Large)    Pulse 60    Temp 98.1 F (36.7 C) (Oral)    Ht 6\' 2"  (1.88 m)    Wt 219 lb (99.3 kg)    SpO2 97%    BMI 28.12 kg/m    Physical Exam Constitutional:      General: He is not in acute distress.    Appearance: He is well-developed and well-nourished.  HENT:     Head: Normocephalic and atraumatic.     Right Ear: Hearing normal.     Left Ear: Hearing normal.     Nose: Nose normal.  Eyes:     General: Lids are normal. No scleral icterus.       Right eye: No discharge.        Left eye: No discharge.     Conjunctiva/sclera: Conjunctivae normal.  Pulmonary:     Effort: Pulmonary effort is normal. No respiratory distress.  Musculoskeletal:     Comments: Sharp pain in the right upper lateral arm to reach above shoulder level or behind back. No pain at rest.  Skin:    General: Skin is intact.     Findings: No lesion or rash.  Neurological:     Mental Status: He is alert and oriented to person, place, and time.  Psychiatric:        Mood and Affect: Mood and affect normal.        Speech: Speech normal.        Behavior: Behavior normal.        Thought Content: Thought content normal.       No results found for any visits on 05/10/20.  Assessment & Plan     1. Chronic left shoulder pain Pain in the left lateral shoulder to reach over head or behind back for the past 2 weeks. No known injury. Has not tried any NSAID's with his history of GERD. Apply Aspercreme with Lidocaine and will get x-ray evaluation. May need referral to orthopedist to evaluate for possible rotator cuff injury/tear. - DG Shoulder Left   No follow-ups on file.      I, Coco Sharpnack,  PA-C, have reviewed all documentation for this visit. The documentation on 05/10/20 for the exam, diagnosis, procedures, and orders are all  accurate and complete.    Vernie Murders, PA-C  [employer] [phone] (phone) [phone] (fax)  [place]

## 2020-05-11 ENCOUNTER — Encounter: Payer: Self-pay | Admitting: Family Medicine

## 2020-05-12 ENCOUNTER — Other Ambulatory Visit: Payer: Self-pay | Admitting: Family Medicine

## 2020-05-12 DIAGNOSIS — M25512 Pain in left shoulder: Secondary | ICD-10-CM

## 2020-05-12 DIAGNOSIS — G8929 Other chronic pain: Secondary | ICD-10-CM

## 2020-05-18 DIAGNOSIS — M19012 Primary osteoarthritis, left shoulder: Secondary | ICD-10-CM | POA: Diagnosis not present

## 2020-05-18 DIAGNOSIS — E119 Type 2 diabetes mellitus without complications: Secondary | ICD-10-CM | POA: Diagnosis not present

## 2020-05-18 DIAGNOSIS — M7552 Bursitis of left shoulder: Secondary | ICD-10-CM | POA: Diagnosis not present

## 2020-05-18 DIAGNOSIS — M7542 Impingement syndrome of left shoulder: Secondary | ICD-10-CM | POA: Diagnosis not present

## 2020-05-18 DIAGNOSIS — M25512 Pain in left shoulder: Secondary | ICD-10-CM | POA: Diagnosis not present

## 2020-05-20 NOTE — Progress Notes (Signed)
Carelink Summary Report / Loop Recorder 

## 2020-05-23 ENCOUNTER — Other Ambulatory Visit: Payer: Self-pay | Admitting: Adult Health

## 2020-05-23 DIAGNOSIS — K219 Gastro-esophageal reflux disease without esophagitis: Secondary | ICD-10-CM | POA: Diagnosis not present

## 2020-05-23 DIAGNOSIS — E119 Type 2 diabetes mellitus without complications: Secondary | ICD-10-CM | POA: Diagnosis not present

## 2020-05-23 DIAGNOSIS — H409 Unspecified glaucoma: Secondary | ICD-10-CM | POA: Diagnosis not present

## 2020-05-23 DIAGNOSIS — D6869 Other thrombophilia: Secondary | ICD-10-CM | POA: Diagnosis not present

## 2020-05-23 DIAGNOSIS — E785 Hyperlipidemia, unspecified: Secondary | ICD-10-CM | POA: Diagnosis not present

## 2020-05-23 DIAGNOSIS — I10 Essential (primary) hypertension: Secondary | ICD-10-CM | POA: Diagnosis not present

## 2020-05-23 DIAGNOSIS — I4891 Unspecified atrial fibrillation: Secondary | ICD-10-CM | POA: Diagnosis not present

## 2020-05-23 DIAGNOSIS — E663 Overweight: Secondary | ICD-10-CM | POA: Diagnosis not present

## 2020-05-23 DIAGNOSIS — J309 Allergic rhinitis, unspecified: Secondary | ICD-10-CM | POA: Diagnosis not present

## 2020-06-01 ENCOUNTER — Other Ambulatory Visit: Payer: Self-pay | Admitting: Family Medicine

## 2020-06-01 NOTE — Telephone Encounter (Signed)
Requested medication (s) are due for refill today: yes  Requested medication (s) are on the active medication list:yes  Last refill:  05/10/20  #30  0 refills  Future visit scheduled: yes 07/15/19  Notes to clinic:  last fill was courtesy refill     Requested Prescriptions  Pending Prescriptions Disp Refills   losartan (COZAAR) 100 MG tablet [Pharmacy Med Name: LOSARTAN POTASSIUM 100 MG TAB] 30 tablet 0    Sig: TAKE 1 TABLET (100 MG TOTAL) BY MOUTH DAILY IN THE MORNING (NEED OFFICE VISIT)      Cardiovascular:  Angiotensin Receptor Blockers Passed - 06/01/2020 10:32 AM      Passed - Cr in normal range and within 180 days    Creatinine, Ser  Date Value Ref Range Status  04/19/2020 0.76 0.61 - 1.24 mg/dL Final          Passed - K in normal range and within 180 days    Potassium  Date Value Ref Range Status  04/19/2020 4.6 3.5 - 5.1 mmol/L Final          Passed - Patient is not pregnant      Passed - Last BP in normal range    BP Readings from Last 1 Encounters:  05/10/20 129/68          Passed - Valid encounter within last 6 months    Recent Outpatient Visits           3 weeks ago Chronic left shoulder pain   [facility], Vickki Muff, PA-C   1 month ago Gastroesophageal reflux disease, unspecified whether esophagitis present   [facility], Kelby Aline, FNP   8 months ago Type 2 diabetes mellitus with diabetic neuropathic arthropathy, without long-term current use of insulin [facility])   [facility] [name]., MD   1 year ago Annual physical exam   [facility] [name]., MD   1 year ago Type 2 diabetes mellitus with diabetic neuropathic arthropathy, without long-term current use of insulin [facility])   [facility] [name]., MD       Future Appointments             In 1 month [name]., MD [facility]

## 2020-06-09 LAB — CUP PACEART REMOTE DEVICE CHECK
Date Time Interrogation Session: 20220126234220
Implantable Pulse Generator Implant Date: 20180705

## 2020-06-13 ENCOUNTER — Ambulatory Visit (INDEPENDENT_AMBULATORY_CARE_PROVIDER_SITE_OTHER): Payer: Medicare PPO

## 2020-06-13 DIAGNOSIS — R55 Syncope and collapse: Secondary | ICD-10-CM | POA: Diagnosis not present

## 2020-06-22 NOTE — Progress Notes (Signed)
Carelink Summary Report / Loop Recorder 

## 2020-06-29 ENCOUNTER — Other Ambulatory Visit: Payer: Self-pay | Admitting: Family Medicine

## 2020-07-05 ENCOUNTER — Other Ambulatory Visit: Payer: Self-pay | Admitting: Adult Health

## 2020-07-05 NOTE — Telephone Encounter (Signed)
Requested Prescriptions  Pending Prescriptions Disp Refills  . omeprazole (PRILOSEC) 40 MG capsule [Pharmacy Med Name: OMEPRAZOLE DR 40 MG CAPSULE] 42 capsule 0    Sig: TAKE 1 CAPSULE BY MOUTH EVERY DAY     Gastroenterology: Proton Pump Inhibitors Passed - 07/05/2020  1:23 AM      Passed - Valid encounter within last 12 months    Recent Outpatient Visits          1 month ago Chronic left shoulder pain   [facility], PA-C   2 months ago Gastroesophageal reflux disease, unspecified whether esophagitis present   [facility], Kelby Aline, FNP   9 months ago Type 2 diabetes mellitus with diabetic neuropathic arthropathy, without long-term current use of insulin [facility])   [facility] [name]., MD   1 year ago Annual physical exam   [facility] [name]., MD   1 year ago Type 2 diabetes mellitus with diabetic neuropathic arthropathy, without long-term current use of insulin [facility])   [facility] [name]., MD      Future Appointments            In 1 week [name]., MD [facility], [facility]

## 2020-07-13 NOTE — Progress Notes (Signed)
I,[name],acting as a scribe for [name], MD.,have documented all relevant documentation on the behalf of [name], MD,as directed by  [name], MD while in the presence of [name], MD.   Complete physical exam   Patient: Roberto Leonard   DOB: 25-Dec-1940   79 y.o. Male  MRN: [id number] Visit Date: 07/14/2020  Today's healthcare provider: [name], MD   Chief Complaint  Patient presents with  . Annual Exam   Subjective    Roberto Leonard is a 80 y.o. male who presents today for a complete physical exam.  He reports consuming a general diet. Home exercise routine includes walks 2 miles daily. He generally feels well. He reports sleeping well. He does not have additional problems to discuss today.  HPI  Patient had AWV with [facility] on 01/04/2020. Patient has several concerns today.  His ongoing left shoulder pain for which a steroid injection helped in the past.  He has seen orthopedics. He also has chronic reflux issues and has some clear sputum that he associates with his reflux. He asked what to do if he gets COVID.  He has all his vaccines. He wants a PSA drawn although I advised against it. He has a low vitamin D by history and is taking 5000 units a day but has read that a high vitamin D could prevent him from getting COVID Glaucoma followed by oncology.  Past Medical History:  Diagnosis Date  . Allergy   . Detached retina    RIGHT  . Diabetes mellitus without complication (HCC)    ORAL MED  . Dupuytren's contracture   . ED (erectile dysfunction)   . GERD (gastroesophageal reflux disease)   . Glaucoma   . Hyperlipidemia   . Hypertension    Past Surgical History:  Procedure Laterality Date  . CATARACT EXTRACTION     right  . COLONOSCOPY WITH PROPOFOL N/A 01/03/2015   Procedure: COLONOSCOPY WITH PROPOFOL;  Surgeon: Lucilla Lame, MD;  Location: [place];  Service: Endoscopy;  Laterality: N/A;   DIABETIC-ORAL MEDS  . COLONOSCOPY WITH PROPOFOL N/A 10/20/2019   Procedure: COLONOSCOPY WITH PROPOFOL;  Surgeon: Lucilla Lame, MD;  Location: [facility] END[facility];  Service: Endoscopy;  Laterality: N/A;  . EYE SURGERY    . HERNIA REPAIR    . LOOP RECORDER INSERTION N/A 11/15/2016   Procedure: Loop Recorder Insertion;  Surgeon: Deboraha Sprang, MD;  Location: [place] CV LAB;  Service: Cardiovascular;  Laterality: N/A;  . POLYPECTOMY N/A 01/03/2015   Procedure: POLYPECTOMY;  Surgeon: Lucilla Lame, MD;  Location: [place];  Service: Endoscopy;  Laterality: N/A;  Sigmoid polyp  . RETINAL DETACHMENT SURGERY     right eye, wrong size lense, secondary surgery to replace it   Social History   Socioeconomic History  . Marital status: Married    Spouse name: Pricilla  . Number of children: 3  . Years of education: college  . Highest education level: Bachelor's degree (e.g., BA, AB, BS)  Occupational History  . Occupation: Retired  . 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 Smoking status: Former Smoker    Packs/day: 0.50    Years: 2.00    Pack years: 1.00    Types: Cigarettes    Quit date: 05/14/1963    Years since quitting: 57.2  . Smokeless tobacco: Never Used  Vaping Use  . Vaping Use: Never used  Substance and Sexual Activity  . Alcohol use: No    Alcohol/week: 0.0 standard drinks  . Drug use: No  . Sexual activity: Not on file  Other Topics Concern  . Not on file  Social History Narrative  . Not on file   Social Determinants of Health   Financial Resource Strain: Low Risk   . Difficulty of Paying Living Expenses: Not hard at all  Food Insecurity: No Food Insecurity  . Worried About Charity fundraiser in the Last Year: Never true  . Ran Out of Food in the Last Year: Never true  Transportation Needs: No Transportation Needs  . Lack of Transportation (Medical): No  . Lack of  Transportation (Non-Medical): No  Physical Activity: Sufficiently Active  . Days of Exercise per Week: 5 days  . Minutes of Exercise per Session: 30 min  Stress: No Stress Concern Present  . Feeling of Stress : Not at all  Social Connections: Moderately Integrated  . Frequency of Communication with Friends and Family: Three times a week  . Frequency of Social Gatherings with Friends and Family: More than three times a week  . Attends Religious Services: More than 4 times per year  . Active Member of Clubs or Organizations: No  . Attends Archivist Meetings: Never  . Marital Status: Married  Human resources officer Violence: Not At Risk  . Fear of Current or Ex-Partner: No  . Emotionally Abused: No  . Physically Abused: No  . Sexually Abused: No   Family Status  Relation Name Status  . Mother  Deceased  . Father  Deceased  . Brother  Alive  . Daughter  Alive   Family History  Problem Relation Age of Onset  . Heart disease Mother        Died from CHF  . Diabetes Mother   . Diabetes Father   . Hypertension Father   . Heart disease Father        plaque build up  . Diabetes Brother   . Diabetes Daughter        type 1   Allergies  Allergen Reactions  . Sulfa Antibiotics Other (See Comments)    SWEATS, bad headache SWEATS, bad headache    Patient Care Team: [name]., MD as PCP - General (Family Medicine) Deboraha Sprang, MD as Consulting Physician (Cardiology) Isaias Cowman, MD as Consulting Physician (Cardiology) Dingeldein, Remo Lipps, MD (Ophthalmology)   Medications: Outpatient Medications Prior to Visit  Medication Sig  . brimonidine (ALPHAGAN) 0.2 % ophthalmic solution Place 1 drop into both eyes 2 (two) times daily.  . Cetirizine HCl 10 MG CAPS Take 10 mg by mouth daily.  . cholecalciferol (VITAMIN D) 1000 units tablet Take 1,000 Units by mouth daily.  Marland Kitchen diltiazem (CARDIZEM CD) 180 MG 24 hr capsule Take 180 mg by mouth daily.   .  dorzolamide-timolol (COSOPT) 22.3-6.8 MG/ML ophthalmic solution Place 1 drop into both eyes 2 (two) times daily.  Marland Kitchen ELIQUIS 5 MG TABS tablet TAKE 1 TABLET BY MOUTH TWICE A DAY  . glucose blood test strip Use with meter to test daily and as needfed  . losartan (COZAAR) 100 MG tablet TAKE 1 TABLET (100 MG TOTAL) BY MOUTH DAILY IN THE MORNING (NEED OFFICE VISIT)  . Multiple Vitamin (MULTIVITAMIN WITH MINERALS) TABS tablet Take 1 tablet by mouth daily.  Marland Kitchen neomycin-polymyxin b-dexamethasone (MAXITROL) 3.5-10000-0.1 SUSP 1 drop 3 (three) times daily.   Marland Kitchen  Omega-3 Fatty Acids (FISH OIL BURP-LESS) 1200 MG CAPS Take 2 capsules by mouth every evening.   Marland Kitchen omeprazole (PRILOSEC) 40 MG capsule TAKE 1 CAPSULE BY MOUTH EVERY DAY  . pioglitazone (ACTOS) 30 MG tablet TAKE 1 TABLET BY MOUTH EVERY DAY IN THE MORNING  . simvastatin (ZOCOR) 20 MG tablet TAKE 1 TABLET BY MOUTH EVERYDAY AT BEDTIME  . diltiazem (TIAZAC) 180 MG 24 hr capsule Take 180 mg daily by mouth. (Patient not taking: No sig reported)   No facility-administered medications prior to visit.    Review of Systems  Genitourinary: Positive for urgency.  Musculoskeletal: Positive for arthralgias.  Hematological: Bruises/bleeds easily.  All other systems reviewed and are negative.      Objective    BP 119/73 (BP Location: Right Arm, Patient Position: Sitting, Cuff Size: Large)   Pulse 61   Temp 97.6 F (36.4 C) (Oral)   Resp 18   Ht 6\' 2"  (1.88 m)   Wt 219 lb (99.3 kg)   SpO2 97%   BMI 28.12 kg/m  BP Readings from Last 3 Encounters:  07/14/20 119/73  05/10/20 129/68  04/19/20 140/64   Wt Readings from Last 3 Encounters:  07/14/20 219 lb (99.3 kg)  05/10/20 219 lb (99.3 kg)  04/19/20 223 lb 6.4 oz (101.3 kg)      Physical Exam Vitals reviewed.  Constitutional:      Appearance: Normal appearance.     Comments: Patient appears younger than his age .  HENT:     Head: Normocephalic and atraumatic.     Right Ear: External ear  normal.     Left Ear: External ear normal.     Nose: Nose normal.     Mouth/Throat:     Pharynx: Oropharynx is clear.  Eyes:     General: No scleral icterus.    Conjunctiva/sclera: Conjunctivae normal.  Cardiovascular:     Rate and Rhythm: Normal rate and regular rhythm.     Pulses: Normal pulses.     Heart sounds: Normal heart sounds.  Pulmonary:     Effort: Pulmonary effort is normal.     Breath sounds: Normal breath sounds.  Abdominal:     Palpations: Abdomen is soft.  Musculoskeletal:     Right lower leg: No edema.     Left lower leg: No edema.  Lymphadenopathy:     Cervical: No cervical adenopathy.  Skin:    General: Skin is warm and dry.  Neurological:     Mental Status: He is alert and oriented to person, place, and time. Mental status is at baseline.  Psychiatric:        Mood and Affect: Mood normal.        Behavior: Behavior normal.        Thought Content: Thought content normal.        Judgment: Judgment normal.       Last depression screening scores PHQ 2/9 Scores 07/14/2020 05/10/2020 01/04/2020  PHQ - 2 Score 0 0 0  PHQ- 9 Score 6 0 -   Last fall risk screening Fall Risk  07/14/2020  Falls in the past year? 0  Number falls in past yr: 0  Injury with Fall? 0  Follow up Falls evaluation completed   Last Audit-C alcohol use screening Alcohol Use Disorder Test (AUDIT) 07/14/2020  1. How often do you have a drink containing alcohol? 0  2. How many drinks containing alcohol do you have on a typical day when you are drinking? 0  3. How often do you have six or more drinks on one occasion? 0  AUDIT-C Score 0  Alcohol Brief Interventions/Follow-up AUDIT Score <7 follow-up not indicated   A score of 3 or more in women, and 4 or more in men indicates increased risk for alcohol abuse, EXCEPT if all of the points are from question 1   No results found for any visits on 07/14/20.  Assessment & Plan    Routine Health Maintenance and Physical Exam  Exercise  Activities and Dietary recommendations Goals    . DIET - INCREASE WATER INTAKE     Recommend increasing water intake to 4-6 glasses a day.        Immunization History  Administered Date(s) Administered  . Hepatitis B, adult 05/16/2016, 08/29/2016, 11/19/2016  . Influenza Split 01/27/2016  . Influenza, High Dose Seasonal PF 02/10/2015, 01/15/2018, 12/25/2018  . Influenza-Unspecified 01/29/2017, 01/15/2018, 12/25/2018  . PFIZER(Purple Top)SARS-COV-2 Vaccination 05/18/2019, 06/08/2019  . Pneumococcal Conjugate-13 01/12/2014  . Pneumococcal Polysaccharide-23 06/23/1998, 03/08/2005  . Td 05/21/2006, 09/16/2018  . Zoster 09/12/2005  . Zoster Recombinat (Shingrix) 08/30/2016, 11/10/2016    Health Maintenance  Topic Date Due  . FOOT EXAM  05/16/2017  . HEMOGLOBIN A1C  03/16/2020  . OPHTHALMOLOGY EXAM  06/11/2020  . COLONOSCOPY (Pts 45-65yrs Insurance coverage will need to be confirmed)  10/19/2024  . TETANUS/TDAP  09/15/2028  . INFLUENZA VACCINE  Completed  . COVID-19 Vaccine  Completed  . PNA vac Low Risk Adult  Completed  . HPV VACCINES  Aged Out    Discussed health benefits of physical activity, and encouraged him to engage in regular exercise appropriate for his age and condition.  1. Annual physical exam Patient has had Covid vaccines plus booster  2. Type 2 diabetes mellitus with diabetic neuropathic arthropathy, without long-term current use of insulin (HCC) Check A1c.  This is been well controlled. - Hemoglobin A1c  3. Essential (primary) hypertension Excellent control - CBC with Differential/Platelet - Comprehensive metabolic panel - TSH  4. Hypercholesteremia On simvastatin. - Lipid panel  5. Elevated PSA PSA today.  It is stable would not order another one. - PSA  6. Exposure to COVID-19 virus Patient request antibodies.  Is fully vaccinated. - SARS-CoV-2 Semi-Quantitative Total Antibody, Spike  7. Gastroesophageal reflux disease, unspecified whether  esophagitis present Omeprazole 40 mg daily   8. Paroxysmal atrial fibrillation (HCC) On Eliquis  9.  Low vitamin D   No follow-ups on file.     I, [name], MD, have reviewed all documentation for this visit. The documentation on 07/16/20 for the exam, diagnosis, procedures, and orders are all accurate and complete.    Emine Lopata Cranford Mon, MD  [facility] [phone] (phone) [phone] (fax)  [place]

## 2020-07-14 ENCOUNTER — Encounter: Payer: Self-pay | Admitting: Family Medicine

## 2020-07-14 ENCOUNTER — Ambulatory Visit (INDEPENDENT_AMBULATORY_CARE_PROVIDER_SITE_OTHER): Payer: Medicare PPO | Admitting: Family Medicine

## 2020-07-14 ENCOUNTER — Other Ambulatory Visit: Payer: Self-pay

## 2020-07-14 VITALS — BP 119/73 | HR 61 | Temp 97.6°F | Resp 18 | Ht 74.0 in | Wt 219.0 lb

## 2020-07-14 DIAGNOSIS — Z20822 Contact with and (suspected) exposure to covid-19: Secondary | ICD-10-CM

## 2020-07-14 DIAGNOSIS — Z0000 Encounter for general adult medical examination without abnormal findings: Secondary | ICD-10-CM

## 2020-07-14 DIAGNOSIS — E11610 Type 2 diabetes mellitus with diabetic neuropathic arthropathy: Secondary | ICD-10-CM | POA: Diagnosis not present

## 2020-07-14 DIAGNOSIS — I480 Paroxysmal atrial fibrillation: Secondary | ICD-10-CM | POA: Diagnosis not present

## 2020-07-14 DIAGNOSIS — R972 Elevated prostate specific antigen [PSA]: Secondary | ICD-10-CM | POA: Diagnosis not present

## 2020-07-14 DIAGNOSIS — K219 Gastro-esophageal reflux disease without esophagitis: Secondary | ICD-10-CM | POA: Diagnosis not present

## 2020-07-14 DIAGNOSIS — I10 Essential (primary) hypertension: Secondary | ICD-10-CM | POA: Diagnosis not present

## 2020-07-14 DIAGNOSIS — E7800 Pure hypercholesterolemia, unspecified: Secondary | ICD-10-CM | POA: Diagnosis not present

## 2020-07-14 NOTE — Patient Instructions (Signed)
Start Vitamin D, Zinc, Vitamin C daily over the counter daily.   Try Robitussin OTC twice daily.

## 2020-07-15 DIAGNOSIS — R972 Elevated prostate specific antigen [PSA]: Secondary | ICD-10-CM | POA: Diagnosis not present

## 2020-07-15 DIAGNOSIS — Z20822 Contact with and (suspected) exposure to covid-19: Secondary | ICD-10-CM | POA: Diagnosis not present

## 2020-07-15 DIAGNOSIS — E11610 Type 2 diabetes mellitus with diabetic neuropathic arthropathy: Secondary | ICD-10-CM | POA: Diagnosis not present

## 2020-07-15 DIAGNOSIS — E7800 Pure hypercholesterolemia, unspecified: Secondary | ICD-10-CM | POA: Diagnosis not present

## 2020-07-15 DIAGNOSIS — I10 Essential (primary) hypertension: Secondary | ICD-10-CM | POA: Diagnosis not present

## 2020-07-16 LAB — COMPREHENSIVE METABOLIC PANEL
ALT: 46 IU/L — ABNORMAL HIGH (ref 0–44)
AST: 31 IU/L (ref 0–40)
Albumin/Globulin Ratio: 1.8 (ref 1.2–2.2)
Albumin: 4.4 g/dL (ref 3.7–4.7)
Alkaline Phosphatase: 71 IU/L (ref 44–121)
BUN/Creatinine Ratio: 16 (ref 10–24)
BUN: 15 mg/dL (ref 8–27)
Bilirubin Total: 0.5 mg/dL (ref 0.0–1.2)
CO2: 22 mmol/L (ref 20–29)
Calcium: 9.5 mg/dL (ref 8.6–10.2)
Chloride: 105 mmol/L (ref 96–106)
Creatinine, Ser: 0.91 mg/dL (ref 0.76–1.27)
Globulin, Total: 2.5 g/dL (ref 1.5–4.5)
Glucose: 125 mg/dL — ABNORMAL HIGH (ref 65–99)
Potassium: 4.7 mmol/L (ref 3.5–5.2)
Sodium: 142 mmol/L (ref 134–144)
Total Protein: 6.9 g/dL (ref 6.0–8.5)
eGFR: 85 mL/min/{1.73_m2} (ref >59)

## 2020-07-16 LAB — CBC WITH DIFFERENTIAL/PLATELET
Basophils Absolute: 0.1 10*3/uL (ref 0.0–0.2)
Basos: 1 %
EOS (ABSOLUTE): 0.3 10*3/uL (ref 0.0–0.4)
Eos: 5 %
Hematocrit: 49.9 % (ref 37.5–51.0)
Hemoglobin: 16.1 g/dL (ref 13.0–17.7)
Immature Grans (Abs): 0 10*3/uL (ref 0.0–0.1)
Immature Granulocytes: 0 %
Lymphocytes Absolute: 1.8 10*3/uL (ref 0.7–3.1)
Lymphs: 29 %
MCH: 28.9 pg (ref 26.6–33.0)
MCHC: 32.3 g/dL (ref 31.5–35.7)
MCV: 89 fL (ref 79–97)
Monocytes Absolute: 0.6 10*3/uL (ref 0.1–0.9)
Monocytes: 10 %
Neutrophils Absolute: 3.3 10*3/uL (ref 1.4–7.0)
Neutrophils: 55 %
Platelets: 176 10*3/uL (ref 150–450)
RBC: 5.58 x10E6/uL (ref 4.14–5.80)
RDW: 12.9 % (ref 11.6–15.4)
WBC: 6.1 10*3/uL (ref 3.4–10.8)

## 2020-07-16 LAB — LIPID PANEL
Chol/HDL Ratio: 4.3 ratio (ref 0.0–5.0)
Cholesterol, Total: 177 mg/dL (ref 100–199)
HDL: 41 mg/dL (ref >39)
LDL Chol Calc (NIH): 108 mg/dL — ABNORMAL HIGH (ref 0–99)
Triglycerides: 158 mg/dL — ABNORMAL HIGH (ref 0–149)
VLDL Cholesterol Cal: 28 mg/dL (ref 5–40)

## 2020-07-16 LAB — PSA: Prostate Specific Ag, Serum: 6.6 ng/mL — ABNORMAL HIGH (ref 0.0–4.0)

## 2020-07-16 LAB — HEMOGLOBIN A1C
Est. average glucose Bld gHb Est-mCnc: 140 mg/dL
Hgb A1c MFr Bld: 6.5 % — ABNORMAL HIGH (ref 4.8–5.6)

## 2020-07-16 LAB — TSH: TSH: 1.15 u[IU]/mL (ref 0.450–4.500)

## 2020-07-16 LAB — SARS-COV-2 SEMI-QUANTITATIVE TOTAL ANTIBODY, SPIKE
SARS-CoV-2 Semi-Quant Total Ab: 1824 U/mL (ref <0.8)
SARS-CoV-2 Spike Ab Interp: POSITIVE

## 2020-07-18 ENCOUNTER — Ambulatory Visit (INDEPENDENT_AMBULATORY_CARE_PROVIDER_SITE_OTHER): Payer: Medicare PPO

## 2020-07-18 DIAGNOSIS — H40003 Preglaucoma, unspecified, bilateral: Secondary | ICD-10-CM | POA: Diagnosis not present

## 2020-07-18 DIAGNOSIS — R55 Syncope and collapse: Secondary | ICD-10-CM

## 2020-07-20 ENCOUNTER — Telehealth: Payer: Self-pay

## 2020-07-20 ENCOUNTER — Other Ambulatory Visit: Payer: Self-pay

## 2020-07-20 LAB — CUP PACEART REMOTE DEVICE CHECK
Date Time Interrogation Session: 20220228235321
Implantable Pulse Generator Implant Date: 20180705

## 2020-07-20 MED ORDER — OMEPRAZOLE 40 MG PO CPDR: DELAYED_RELEASE_CAPSULE | ORAL | 1 refills | Status: DC

## 2020-07-20 NOTE — Telephone Encounter (Signed)
Patient called requesting a 90 day supply of Omeprazole.

## 2020-07-20 NOTE — Telephone Encounter (Signed)
Ok

## 2020-07-20 NOTE — Telephone Encounter (Signed)
Please see response below. KW

## 2020-07-20 NOTE — Telephone Encounter (Signed)
FYI

## 2020-07-20 NOTE — Telephone Encounter (Signed)
Pt given lab results per notes of Dr. Rosanna Randy on 07/19/20. Pt verbalized understanding. He says he has gained weight over the last 3 months and has started a diet since Sunday. He says he wants to lose 12 lbs over the next 3 months, then he would like his A1C and Cholesterol levels rechecked. He says he will stay on his current medication Simvastatin 20 mg over these next 3 months. He says after he has the rechecks done and if his numbers are not back to where they were 3 months ago, he will do what is recommended by Dr. Rosanna Randy. He says that a few years back when Dr. Rosanna Randy sent him to [facility] to see a urologist, he said he was told that as long as the level is not above 8.0 per [facility] guidelines, there is no need for evaluation. He says he will go if his level reaches 8.0.  He also says that he asked about a Vitamin D level and wanted to know if it was included in his lab draw. I advised it was not. He says he would like to have that done. I advised Dr. Rosanna Randy would have to put the order in and someone will notify him to come in for the labs. He says he can be notified via [place]. He asked if he could get a 90 day supply of Omeprazole. I advised it will be sent in to the pharmacy. Advised I will send this to Dr. Rosanna Randy for review.   Roberto Leonard Pro., MD  07/19/2020 12:35 PM EST      Labs stable but cholesterol minimally elevated. Will change simvastatin 20 to rosuvastatin 20 mg daily. PSA higher than last visit but as I told the patient at 79 this could mean anything. Would be happy to refer to urology.

## 2020-07-25 DIAGNOSIS — E119 Type 2 diabetes mellitus without complications: Secondary | ICD-10-CM | POA: Diagnosis not present

## 2020-07-25 LAB — HM DIABETES EYE EXAM

## 2020-07-27 NOTE — Progress Notes (Signed)
Carelink Summary Report / Loop Recorder 

## 2020-08-01 ENCOUNTER — Telehealth: Payer: Self-pay

## 2020-08-01 NOTE — Telephone Encounter (Signed)
Carelink alert received for Linq reaching RRT 07/31/20. Spoke with patient who is requesting removal and re-implantation of device. Explained to patient this would be a decision made by EP and not EP RN. 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 Patient is agreeable with unplugging linq1 monitor and will return via return kit. Remote transmission appointments cancelled in epic. Carelink and [facility] updated. Will route note to EP MD for consideration and follow up with patient as indicated.

## 2020-08-02 NOTE — Telephone Encounter (Signed)
P  this is a conversation best had in the office   We can see him [facility]  Thanks SK

## 2020-08-04 NOTE — Telephone Encounter (Signed)
Will forward to [facility] scheduling to please reach out to the patient and see if he can come in on:  Thursday 4/7 @ 8:40 am Thursday 4/14 @ 8:40 am  To discuss the current status of his loop recorder and is re-implantation is needed at this time.

## 2020-08-04 NOTE — Telephone Encounter (Signed)
Scheduled 04/07

## 2020-08-18 ENCOUNTER — Encounter: Payer: Self-pay | Admitting: Internal Medicine

## 2020-08-18 ENCOUNTER — Other Ambulatory Visit: Payer: Self-pay

## 2020-08-18 ENCOUNTER — Ambulatory Visit (INDEPENDENT_AMBULATORY_CARE_PROVIDER_SITE_OTHER): Payer: Medicare PPO | Admitting: Internal Medicine

## 2020-08-18 VITALS — BP 112/78 | HR 54 | Ht 74.0 in | Wt 218.0 lb

## 2020-08-18 DIAGNOSIS — I480 Paroxysmal atrial fibrillation: Secondary | ICD-10-CM | POA: Diagnosis not present

## 2020-08-18 DIAGNOSIS — R55 Syncope and collapse: Secondary | ICD-10-CM

## 2020-08-18 NOTE — Patient Instructions (Signed)
Medication Instructions:  - Your physician recommends that you continue on your current medications as directed. Please refer to the Current Medication list given to you today.  *If you need a refill on your cardiac medications before your next appointment, please call your pharmacy*   Lab Work: - none ordered  If you have labs (blood work) drawn today and your tests are completely normal, you will receive your results only by: Marland Kitchen MyChart Message (if you have MyChart) OR . A paper copy in the mail If you have any lab test that is abnormal or we need to change your treatment, we will call you to review the results.   Testing/Procedures: - none ordered   Follow-Up: At [facility], you and your health needs are our priority.  As part of our continuing mission to provide you with exceptional heart care, we have created designated Provider Care Teams.  These Care Teams include your primary Cardiologist (physician) and Advanced Practice Providers (APPs -  Physician Assistants and Nurse Practitioners) who all work together to provide you with the care you need, when you need it.  We recommend signing up for the patient portal called "MyChart".  Sign up information is provided on this After Visit Summary.  MyChart is used to connect with patients for Virtual Visits (Telemedicine).  Patients are able to view lab/test results, encounter notes, upcoming appointments, etc.  Non-urgent messages can be sent to your provider as well.   To learn more about what you can do with MyChart, go to [email or web address].    Your next appointment:   As needed    The format for your next appointment:   n/a  Provider:   Virl Axe, MD   Other Instructions

## 2020-08-18 NOTE — Progress Notes (Signed)
Patient Care Team: [name]., MD as PCP - General (Family Medicine) Deboraha Sprang, MD as Consulting Physician (Cardiology) Isaias Cowman, MD as Consulting Physician (Cardiology) Dingeldein, Remo Lipps, MD (Ophthalmology)   HPI  Roberto Leonard is a 80 y.o. male Seen following a hospitalization over the weekend because of syncope. He was noted at that time to have a right bundle branch block. He underwent echocardiogram 7/18 demonstrated normal LV function with mild LAE.  In-hospital telemetry demonstrated no arrhythmia  After his last visit and the recognition of a yellow-green as part of the prodrome which turned out to be well described in neurally mediated events we elected to proceed with implantable loop recorder as opposed to pacing.  He had interval syncope in the context of atrial fibrillation but without specific arrhythmia  He was started on apixoban; no clinical bleeding followed by Dr. Kermit Balo No interval syncope  The question for him as as his Linq recorder reaches EOS should the battery to be replaced.   Records and Results Reviewed As above  Thromboembolic risk factors ( age  -2, HTN-1, DM-1 ) for a CHADSVASc Score of 4 Past Medical History:  Diagnosis Date  . Allergy   . Detached retina    RIGHT  . Diabetes mellitus without complication (HCC)    ORAL MED  . Dupuytren's contracture   . ED (erectile dysfunction)   . GERD (gastroesophageal reflux disease)   . Glaucoma   . Hyperlipidemia   . Hypertension     Past Surgical History:  Procedure Laterality Date  . CATARACT EXTRACTION     right  . COLONOSCOPY WITH PROPOFOL N/A 01/03/2015   Procedure: COLONOSCOPY WITH PROPOFOL;  Surgeon: Lucilla Lame, MD;  Location: [place];  Service: Endoscopy;  Laterality: N/A;  DIABETIC-ORAL MEDS  . COLONOSCOPY WITH PROPOFOL N/A 10/20/2019   Procedure: COLONOSCOPY WITH PROPOFOL;  Surgeon: Lucilla Lame, MD;  Location: [facility] ENDOSCOPY;  Service:  Endoscopy;  Laterality: N/A;  . EYE SURGERY    . HERNIA REPAIR    . LOOP RECORDER INSERTION N/A 11/15/2016   Procedure: Loop Recorder Insertion;  Surgeon: Deboraha Sprang, MD;  Location: [place] CV LAB;  Service: Cardiovascular;  Laterality: N/A;  . POLYPECTOMY N/A 01/03/2015   Procedure: POLYPECTOMY;  Surgeon: Lucilla Lame, MD;  Location: [place];  Service: Endoscopy;  Laterality: N/A;  Sigmoid polyp  . RETINAL DETACHMENT SURGERY     right eye, wrong size lense, secondary surgery to replace it    Current Outpatient Medications  Medication Sig Dispense Refill  . brimonidine (ALPHAGAN) 0.2 % ophthalmic solution Place 1 drop into both eyes 2 (two) times daily.  3  . Cetirizine HCl 10 MG CAPS Take 10 mg by mouth daily.    . cholecalciferol (VITAMIN D) 1000 units tablet Take 1,000 Units by mouth daily.    Marland Kitchen diltiazem (CARDIZEM CD) 180 MG 24 hr capsule Take 180 mg by mouth daily.     . dorzolamide-timolol (COSOPT) 22.3-6.8 MG/ML ophthalmic solution Place 1 drop into both eyes 2 (two) times daily.    Marland Kitchen ELIQUIS 5 MG TABS tablet TAKE 1 TABLET BY MOUTH TWICE A DAY 180 tablet 3  . glucose blood test strip Use with meter to test daily and as needfed 100 each 12  . losartan (COZAAR) 100 MG tablet TAKE 1 TABLET (100 MG TOTAL) BY MOUTH DAILY IN THE MORNING (NEED OFFICE VISIT) 30 tablet 3  . Multiple Vitamin (MULTIVITAMIN WITH  MINERALS) TABS tablet Take 1 tablet by mouth daily.    Marland Kitchen neomycin-polymyxin b-dexamethasone (MAXITROL) 3.5-10000-0.1 SUSP 1 drop 3 (three) times daily.     . Omega-3 Fatty Acids (FISH OIL BURP-LESS) 1200 MG CAPS Take 2 capsules by mouth every evening.     Marland Kitchen omeprazole (PRILOSEC) 40 MG capsule TAKE 1 CAPSULE BY MOUTH EVERY DAY 90 capsule 1  . pioglitazone (ACTOS) 30 MG tablet TAKE 1 TABLET BY MOUTH EVERY DAY IN THE MORNING 90 tablet 3  . simvastatin (ZOCOR) 20 MG tablet TAKE 1 TABLET BY MOUTH EVERYDAY AT BEDTIME 90 tablet 0   No current facility-administered  medications for this visit.    Allergies  Allergen Reactions  . Sulfa Antibiotics Other (See Comments)    SWEATS, bad headache SWEATS, bad headache      Review of Systems negative except from HPI and PMH  Physical Exam BP 112/78   Pulse (!) 54   Ht 6\' 2"  (1.88 m)   Wt 218 lb (98.9 kg)   BMI 27.99 kg/m  Well developed and nourished in no acute distress HENT normal Neck supple with JVP-  Clear Regular rate and rhythm, no murmurs or gallops Abd-soft with active BS No Clubbing cyanosis edema Skin-warm and dry A & Oriented  Grossly normal sensory and motor function  ECG sinus@ 54 17/14/42    Assessment and  Plan  Syncope    Right bundle branch block  Sinus bradycardia  Implantable Loop recorder   Atrial fibrillation-paroxysmal  CHADSVASc 4     No interval syncope  The discussion was as to whether to remove the Linq or not.  I suggested there is no role for removal and reimplantation and no way to replace the battery; his inclination was to not replace the battery and when offered the option of removal or abandonment, he has chosen the latter.  It is a decision with which I agree.  No bleeding on Eliquis.  Hemoglobin was stable 3/22  We will see him as needed

## 2020-09-01 ENCOUNTER — Other Ambulatory Visit: Payer: Self-pay | Admitting: Family Medicine

## 2020-09-01 NOTE — Telephone Encounter (Signed)
Requested Prescriptions  Pending Prescriptions Disp Refills  . losartan (COZAAR) 100 MG tablet [Pharmacy Med Name: LOSARTAN POTASSIUM 100 MG TAB] 90 tablet 1    Sig: TAKE 1 TABLET (100 MG TOTAL) BY MOUTH DAILY IN THE MORNING (NEED OFFICE VISIT)     Cardiovascular:  Angiotensin Receptor Blockers Passed - 09/01/2020  1:25 AM      Passed - Cr in normal range and within 180 days    Creatinine, Ser  Date Value Ref Range Status  07/15/2020 0.91 0.76 - 1.27 mg/dL Final         Passed - K in normal range and within 180 days    Potassium  Date Value Ref Range Status  07/15/2020 4.7 3.5 - 5.2 mmol/L Final         Passed - Patient is not pregnant      Passed - Last BP in normal range    BP Readings from Last 1 Encounters:  08/18/20 112/78         Passed - Valid encounter within last 6 months    Recent Outpatient Visits          1 month ago Annual physical exam   [facility] [name]., MD   3 months ago Chronic left shoulder pain   [facility] Chrismon, Vickki Muff, PA-C   4 months ago Gastroesophageal reflux disease, unspecified whether esophagitis present   [facility] Flinchum, Kelby Aline, FNP   11 months ago Type 2 diabetes mellitus with diabetic neuropathic arthropathy, without long-term current use of insulin [facility])   [facility] [name]., MD   1 year ago Annual physical exam   [facility] [name]., MD      Future Appointments            In 4 months [name]., MD [facility], [facility]

## 2020-09-21 DIAGNOSIS — I10 Essential (primary) hypertension: Secondary | ICD-10-CM | POA: Diagnosis not present

## 2020-09-21 DIAGNOSIS — R001 Bradycardia, unspecified: Secondary | ICD-10-CM | POA: Diagnosis not present

## 2020-09-21 DIAGNOSIS — I4510 Unspecified right bundle-branch block: Secondary | ICD-10-CM | POA: Diagnosis not present

## 2020-09-21 DIAGNOSIS — R55 Syncope and collapse: Secondary | ICD-10-CM | POA: Diagnosis not present

## 2020-09-21 DIAGNOSIS — I480 Paroxysmal atrial fibrillation: Secondary | ICD-10-CM | POA: Diagnosis not present

## 2020-09-21 DIAGNOSIS — E782 Mixed hyperlipidemia: Secondary | ICD-10-CM | POA: Diagnosis not present

## 2020-09-21 DIAGNOSIS — E119 Type 2 diabetes mellitus without complications: Secondary | ICD-10-CM | POA: Diagnosis not present

## 2020-09-22 ENCOUNTER — Other Ambulatory Visit: Payer: Self-pay | Admitting: Family Medicine

## 2020-09-22 NOTE — Telephone Encounter (Signed)
Requested Prescriptions  Pending Prescriptions Disp Refills  . simvastatin (ZOCOR) 20 MG tablet [Pharmacy Med Name: SIMVASTATIN 20 MG TABLET] 90 tablet 3    Sig: TAKE 1 TABLET BY MOUTH EVERYDAY AT BEDTIME     Cardiovascular:  Antilipid - Statins Failed - 09/22/2020  2:13 AM      Failed - LDL in normal range and within 360 days    LDL Cholesterol (Calc)  Date Value Ref Range Status  03/29/2017 91 mg/dL (calc) Final    Comment:    Reference range: <100 . Desirable range <100 mg/dL for primary prevention;   <70 mg/dL for patients with CHD or diabetic patients  with > or = 2 CHD risk factors. Marland Kitchen LDL-C is now calculated using the Martin-Hopkins  calculation, which is a validated novel method providing  better accuracy than the Friedewald equation in the  estimation of LDL-C.  Cresenciano Genre et al. Annamaria Helling. 6433;295(18): 2061-2068  (http://education.Quest[employer]ostics.com/faq/FAQ164)    LDL Chol Calc (NIH)  Date Value Ref Range Status  07/15/2020 108 (H) 0 - 99 mg/dL Final         Failed - Triglycerides in normal range and within 360 days    Triglycerides  Date Value Ref Range Status  07/15/2020 158 (H) 0 - 149 mg/dL Final         Passed - Total Cholesterol in normal range and within 360 days    Cholesterol, Total  Date Value Ref Range Status  07/15/2020 177 100 - 199 mg/dL Final         Passed - HDL in normal range and within 360 days    HDL  Date Value Ref Range Status  07/15/2020 41 >39 mg/dL Final         Passed - Patient is not pregnant      Passed - Valid encounter within last 12 months    Recent Outpatient Visits          2 months ago Annual physical exam   [facility] [name]., MD   4 months ago Chronic left shoulder pain   [place], Vickki Muff, PA-C   5 months ago Gastroesophageal reflux disease, unspecified whether esophagitis present   [facility] Flinchum, Kelby Aline, FNP   1 year ago Type 2  diabetes mellitus with diabetic neuropathic arthropathy, without long-term current use of insulin [facility])   [facility] [name]., MD   1 year ago Annual physical exam   [facility] [name]., MD      Future Appointments            In 3 months [name]., MD [facility], [place]

## 2020-10-09 ENCOUNTER — Other Ambulatory Visit: Payer: Self-pay | Admitting: Family Medicine

## 2020-10-09 DIAGNOSIS — E11610 Type 2 diabetes mellitus with diabetic neuropathic arthropathy: Secondary | ICD-10-CM

## 2020-10-09 NOTE — Telephone Encounter (Signed)
Requested Prescriptions  Pending Prescriptions Disp Refills  . pioglitazone (ACTOS) 30 MG tablet [Pharmacy Med Name: PIOGLITAZONE HCL 30 MG TABLET] 90 tablet 0    Sig: TAKE 1 TABLET BY MOUTH EVERY DAY IN THE MORNING     Endocrinology:  Diabetes - Glitazones - pioglitazone Passed - 10/09/2020 12:50 AM      Passed - HBA1C is between 0 and 7.9 and within 180 days    Hgb A1c MFr Bld  Date Value Ref Range Status  07/15/2020 6.5 (H) 4.8 - 5.6 % Final    Comment:             Prediabetes: 5.7 - 6.4          Diabetes: >6.4          Glycemic control for adults with diabetes: <7.0          Passed - Valid encounter within last 6 months    Recent Outpatient Visits          2 months ago Annual physical exam   [facility] [name]., MD   5 months ago Chronic left shoulder pain   [place], Vickki Muff, PA-C   5 months ago Gastroesophageal reflux disease, unspecified whether esophagitis present   [facility], Kelby Aline, FNP   1 year ago Type 2 diabetes mellitus with diabetic neuropathic arthropathy, without long-term current use of insulin [facility])   [facility] [name]., MD   1 year ago Annual physical exam   [facility] [name]., MD      Future Appointments            In 3 months [name]., MD [facility], [place]

## 2020-10-12 ENCOUNTER — Telehealth: Payer: Self-pay

## 2020-10-12 DIAGNOSIS — E11610 Type 2 diabetes mellitus with diabetic neuropathic arthropathy: Secondary | ICD-10-CM

## 2020-10-12 NOTE — Telephone Encounter (Signed)
Last HcbA1c was 07/2020 and was 6.5. Patient has a f/u in 01/2021 to recheck. Did you want to order labs sooner? Please advise. Thanks!

## 2020-10-12 NOTE — Telephone Encounter (Signed)
Copied from [place] [phone]. Topic: General - Other >> Oct 12, 2020 11:32 AM Pawlus, Brayton Layman A wrote: Reason for CRM: Pt wanted to re-check his A1Cs, pt wanted to know if Dr Rosanna Randy would place lab orders to re-check since he has gone on a diet / changed his lifestyle.

## 2020-10-18 DIAGNOSIS — E11610 Type 2 diabetes mellitus with diabetic neuropathic arthropathy: Secondary | ICD-10-CM | POA: Diagnosis not present

## 2020-10-18 NOTE — Telephone Encounter (Signed)
Labs ordered.

## 2020-10-19 LAB — HEMOGLOBIN A1C
Est. average glucose Bld gHb Est-mCnc: 123 mg/dL
Hgb A1c MFr Bld: 5.9 % — ABNORMAL HIGH (ref 4.8–5.6)

## 2020-11-06 ENCOUNTER — Other Ambulatory Visit: Payer: Self-pay | Admitting: Family Medicine

## 2020-11-06 NOTE — Telephone Encounter (Signed)
Requested Prescriptions  Pending Prescriptions Disp Refills  . ELIQUIS 5 MG TABS tablet [Pharmacy Med Name: ELIQUIS 5 MG TABLET] 180 tablet 2    Sig: TAKE 1 TABLET BY MOUTH TWICE A DAY     Hematology:  Anticoagulants Passed - 11/06/2020 12:46 AM      Passed - HGB in normal range and within 360 days    Hemoglobin  Date Value Ref Range Status  07/15/2020 16.1 13.0 - 17.7 g/dL Final         Passed - PLT in normal range and within 360 days    Platelets  Date Value Ref Range Status  07/15/2020 176 150 - 450 x10E3/uL Final         Passed - HCT in normal range and within 360 days    Hematocrit  Date Value Ref Range Status  07/15/2020 49.9 37.5 - 51.0 % Final         Passed - Cr in normal range and within 360 days    Creatinine, Ser  Date Value Ref Range Status  07/15/2020 0.91 0.76 - 1.27 mg/dL Final         Passed - Valid encounter within last 12 months    Recent Outpatient Visits          3 months ago Annual physical exam   [facility] [name]., MD   6 months ago Chronic left shoulder pain   [facility] Chrismon, Vickki Muff, PA-C   6 months ago Gastroesophageal reflux disease, unspecified whether esophagitis present   [facility], Kelby Aline, FNP   1 year ago Type 2 diabetes mellitus with diabetic neuropathic arthropathy, without long-term current use of insulin [facility])   [facility] [name]., MD   1 year ago Annual physical exam   [facility] [name]., MD      Future Appointments            In 2 months [name]., MD [facility], [place]

## 2021-01-05 ENCOUNTER — Other Ambulatory Visit: Payer: Self-pay | Admitting: Family Medicine

## 2021-01-05 DIAGNOSIS — E11610 Type 2 diabetes mellitus with diabetic neuropathic arthropathy: Secondary | ICD-10-CM

## 2021-01-05 NOTE — Telephone Encounter (Signed)
Requested Prescriptions  Pending Prescriptions Disp Refills  . pioglitazone (ACTOS) 30 MG tablet [Pharmacy Med Name: PIOGLITAZONE HCL 30 MG TABLET] 90 tablet 0    Sig: TAKE 1 TABLET BY MOUTH EVERY DAY IN THE MORNING     Endocrinology:  Diabetes - Glitazones - pioglitazone Passed - 01/05/2021  1:21 AM      Passed - HBA1C is between 0 and 7.9 and within 180 days    Hgb A1c MFr Bld  Date Value Ref Range Status  10/18/2020 5.9 (H) 4.8 - 5.6 % Final    Comment:             Prediabetes: 5.7 - 6.4          Diabetes: >6.4          Glycemic control for adults with diabetes: <7.0          Passed - Valid encounter within last 6 months    Recent Outpatient Visits          5 months ago Annual physical exam   [facility] [name]., MD   8 months ago Chronic left shoulder pain   [facility] Chrismon, Vickki Muff, PA-C   8 months ago Gastroesophageal reflux disease, unspecified whether esophagitis present   [facility] Flinchum, Kelby Aline, FNP   1 year ago Type 2 diabetes mellitus with diabetic neuropathic arthropathy, without long-term current use of insulin [facility])   [facility] [name]., MD   1 year ago Annual physical exam   [facility] [name]., MD      Future Appointments            In 2 weeks [name]., MD [facility]

## 2021-01-12 ENCOUNTER — Other Ambulatory Visit: Payer: Self-pay | Admitting: Family Medicine

## 2021-01-12 NOTE — Telephone Encounter (Signed)
Requested Prescriptions  Pending Prescriptions Disp Refills  . omeprazole (PRILOSEC) 40 MG capsule [Pharmacy Med Name: OMEPRAZOLE DR 40 MG CAPSULE] 90 capsule 1    Sig: TAKE 1 CAPSULE BY MOUTH EVERY DAY     Gastroenterology: Proton Pump Inhibitors Passed - 01/12/2021  1:26 AM      Passed - Valid encounter within last 12 months    Recent Outpatient Visits          6 months ago Annual physical exam   [facility] [name]., MD   8 months ago Chronic left shoulder pain   [facility] Chrismon, Vickki Muff, PA-C   8 months ago Gastroesophageal reflux disease, unspecified whether esophagitis present   [facility] Flinchum, Kelby Aline, FNP   1 year ago Type 2 diabetes mellitus with diabetic neuropathic arthropathy, without long-term current use of insulin [facility])   [facility] [name]., MD   1 year ago Annual physical exam   [facility] [name]., MD      Future Appointments            In 1 week [name]., MD [facility], [place]

## 2021-01-17 DIAGNOSIS — D2262 Melanocytic nevi of left upper limb, including shoulder: Secondary | ICD-10-CM | POA: Diagnosis not present

## 2021-01-17 DIAGNOSIS — Z85828 Personal history of other malignant neoplasm of skin: Secondary | ICD-10-CM | POA: Diagnosis not present

## 2021-01-17 DIAGNOSIS — D2261 Melanocytic nevi of right upper limb, including shoulder: Secondary | ICD-10-CM | POA: Diagnosis not present

## 2021-01-17 DIAGNOSIS — D2272 Melanocytic nevi of left lower limb, including hip: Secondary | ICD-10-CM | POA: Diagnosis not present

## 2021-01-17 DIAGNOSIS — L570 Actinic keratosis: Secondary | ICD-10-CM | POA: Diagnosis not present

## 2021-01-17 DIAGNOSIS — S70362A Insect bite (nonvenomous), left thigh, initial encounter: Secondary | ICD-10-CM | POA: Diagnosis not present

## 2021-01-19 ENCOUNTER — Ambulatory Visit: Payer: Medicare PPO | Admitting: Family Medicine

## 2021-01-19 ENCOUNTER — Other Ambulatory Visit: Payer: Self-pay

## 2021-01-19 ENCOUNTER — Encounter: Payer: Self-pay | Admitting: Family Medicine

## 2021-01-19 VITALS — BP 127/63 | HR 58 | Temp 97.9°F | Resp 16 | Ht 74.0 in | Wt 210.0 lb

## 2021-01-19 DIAGNOSIS — E11610 Type 2 diabetes mellitus with diabetic neuropathic arthropathy: Secondary | ICD-10-CM

## 2021-01-19 DIAGNOSIS — R972 Elevated prostate specific antigen [PSA]: Secondary | ICD-10-CM | POA: Diagnosis not present

## 2021-01-19 DIAGNOSIS — Z23 Encounter for immunization: Secondary | ICD-10-CM

## 2021-01-19 DIAGNOSIS — E7800 Pure hypercholesterolemia, unspecified: Secondary | ICD-10-CM

## 2021-01-19 DIAGNOSIS — R059 Cough, unspecified: Secondary | ICD-10-CM | POA: Diagnosis not present

## 2021-01-19 DIAGNOSIS — I482 Chronic atrial fibrillation, unspecified: Secondary | ICD-10-CM

## 2021-01-19 NOTE — Progress Notes (Signed)
I,Roberto Leonard,acting as a scribe for Roberto Durie, MD.,have documented all relevant documentation on the behalf of Roberto Durie, MD,as directed by  Roberto Durie, MD while in the presence of Roberto Durie, MD.   Established patient visit   Patient: Roberto Leonard   DOB: Mar 23, 1941   80 y.o. Male  MRN: [id number] Visit Date: 01/19/2021  Today's healthcare provider: [name], MD   Chief Complaint  Patient presents with   Follow-up   Diabetes   Hypertension   Hyperlipidemia   Subjective    HPI  Patient comes in today for follow-up glycemia hyperlipidemia atrial fibrillation and mild elevation of PSA. Diabetes Mellitus Type II, follow-up  Lab Results  Component Value Date   HGBA1C 5.9 (H) 10/18/2020   HGBA1C 6.5 (H) 07/15/2020   HGBA1C 6.0 (A) 09/14/2019   Last seen for diabetes 6 months ago.  Management since then includes continuing the same treatment. He reports good compliance with treatment. He is not having side effects. none  Home blood sugar records: fasting range: not checking  Episodes of hypoglycemia? No none   Current insulin regiment: n/a Most Recent Eye Exam: 07/25/2020  --------------------------------------------------------------------------------------------------- Hypertension, follow-up  BP Readings from Last 3 Encounters:  01/19/21 127/63  08/18/20 112/78  07/14/20 119/73   Wt Readings from Last 3 Encounters:  01/19/21 210 lb (95.3 kg)  08/18/20 218 lb (98.9 kg)  07/14/20 219 lb (99.3 kg)     He was last seen for hypertension 6 months ago.  BP at that visit was 119/73. Management since that visit includes; Excellent control. He reports good compliance with treatment. He is not having side effects. none He is exercising. He is not adherent to low salt diet.   Outside blood pressures are not checking.  He does not smoke.  Use of agents associated with hypertension: none.    --------------------------------------------------------------------------------------------------- Lipid/Cholesterol, follow-up  Last Lipid Panel: Lab Results  Component Value Date   CHOL 177 07/15/2020   LDLCALC 108 (H) 07/15/2020   HDL 41 07/15/2020   TRIG 158 (H) 07/15/2020    He was last seen for this 6 months ago.  Management since that visit includes; on simvastatin.  He reports good compliance with treatment. He is not having side effects. none  He is following a Regular diet. Current exercise: walking  Last metabolic panel Lab Results  Component Value Date   GLUCOSE 125 (H) 07/15/2020   NA 142 07/15/2020   K 4.7 07/15/2020   BUN 15 07/15/2020   CREATININE 0.91 07/15/2020   GFRNONAA >60 04/19/2020   GFRAA 96 03/17/2019   CALCIUM 9.5 07/15/2020   AST 31 07/15/2020   ALT 46 (H) 07/15/2020   The ASCVD Risk score (Arnett DK, et al., 2019) failed to calculate for the following reasons:   The 2019 ASCVD risk score is only valid for ages 46 to 11  ---------------------------------------------------------------------------------------------------     Medications: Outpatient Medications Prior to Visit  Medication Sig   brimonidine (ALPHAGAN) 0.2 % ophthalmic solution Place 1 drop into both eyes 2 (two) times daily.   Cetirizine HCl 10 MG CAPS Take 10 mg by mouth daily.   cholecalciferol (VITAMIN D) 1000 units tablet Take 1,000 Units by mouth daily.   diltiazem (CARDIZEM CD) 180 MG 24 hr capsule Take 180 mg by mouth daily.    dorzolamide-timolol (COSOPT) 22.3-6.8 MG/ML ophthalmic solution Place 1 drop into both eyes 2 (two) times daily.   ELIQUIS 5 MG  TABS tablet TAKE 1 TABLET BY MOUTH TWICE A DAY   glucose blood test strip Use with meter to test daily and as needfed   losartan (COZAAR) 100 MG tablet TAKE 1 TABLET (100 MG TOTAL) BY MOUTH DAILY IN THE MORNING (NEED OFFICE VISIT)   Multiple Vitamin (MULTIVITAMIN WITH MINERALS) TABS tablet Take 1 tablet by mouth  daily.   neomycin-polymyxin b-dexamethasone (MAXITROL) 3.5-10000-0.1 SUSP 1 drop 3 (three) times daily.    Omega-3 Fatty Acids (FISH OIL BURP-LESS) 1200 MG CAPS Take 2 capsules by mouth every evening.    omeprazole (PRILOSEC) 40 MG capsule TAKE 1 CAPSULE BY MOUTH EVERY DAY   pioglitazone (ACTOS) 30 MG tablet TAKE 1 TABLET BY MOUTH EVERY DAY IN THE MORNING   simvastatin (ZOCOR) 20 MG tablet TAKE 1 TABLET BY MOUTH EVERYDAY AT BEDTIME   No facility-administered medications prior to visit.    Review of Systems  Constitutional:  Negative for appetite change, chills and fever.  Respiratory:  Negative for chest tightness, shortness of breath and wheezing.   Cardiovascular:  Negative for chest pain and palpitations.  Gastrointestinal:  Negative for abdominal pain, nausea and vomiting.       Objective    BP 127/63 (BP Location: Left Arm, Patient Position: Sitting, Cuff Size: Large)   Pulse (!) 58   Temp 97.9 F (36.6 C) (Temporal)   Resp 16   Ht '6\' 2"'$  (1.88 m)   Wt 210 lb (95.3 kg)   SpO2 96%   BMI 26.96 kg/m  BP Readings from Last 3 Encounters:  01/19/21 127/63  08/18/20 112/78  07/14/20 119/73   Wt Readings from Last 3 Encounters:  01/19/21 210 lb (95.3 kg)  08/18/20 218 lb (98.9 kg)  07/14/20 219 lb (99.3 kg)      Physical Exam Vitals reviewed.  Constitutional:      Appearance: Normal appearance.  Eyes:     General: No scleral icterus.    Conjunctiva/sclera: Conjunctivae normal.  Neck:     Vascular: No carotid bruit.  Cardiovascular:     Rate and Rhythm: Normal rate and regular rhythm.     Pulses: Normal pulses.     Heart sounds: Normal heart sounds.  Pulmonary:     Effort: Pulmonary effort is normal.     Breath sounds: Normal breath sounds.  Musculoskeletal:     Right lower leg: No edema.     Left lower leg: No edema.  Skin:    General: Skin is warm and dry.  Neurological:     Mental Status: He is alert.  Psychiatric:        Mood and Affect: Mood normal.         Behavior: Behavior normal.      No results found for any visits on 01/19/21.  Assessment & Plan     1. Type 2 diabetes mellitus with diabetic neuropathic arthropathy, without long-term current use of insulin (HCC) Actos. - Lipid panel - Comprehensive metabolic panel  2. Hypercholesteremia Zocor. - Hemoglobin A1c  3. Elevated PSA If it jumps will refer but if stable will not follow unless pt symptomatic. - PSA  4. Flu vaccine need  - Flu Vaccine QUAD High Dose(Fluad)  5. Chronic atrial fibrillation (HCC) Eliquis  6. Cough Robitussin until clear. Improving.   No follow-ups on file.      I, Roberto Durie, MD, have reviewed all documentation for this visit. The documentation on 01/22/21 for the exam, diagnosis, procedures, and orders are all accurate and  complete.    Jaquanda Wickersham Cranford Mon, MD  [facility] [phone] (phone) [phone] (fax)  [place]

## 2021-01-20 LAB — COMPREHENSIVE METABOLIC PANEL
ALT: 25 IU/L (ref 0–44)
AST: 24 IU/L (ref 0–40)
Albumin/Globulin Ratio: 2 (ref 1.2–2.2)
Albumin: 4.3 g/dL (ref 3.6–4.6)
Alkaline Phosphatase: 78 IU/L (ref 44–121)
BUN/Creatinine Ratio: 22 (ref 10–24)
BUN: 17 mg/dL (ref 8–27)
Bilirubin Total: 0.4 mg/dL (ref 0.0–1.2)
CO2: 22 mmol/L (ref 20–29)
Calcium: 9.2 mg/dL (ref 8.6–10.2)
Chloride: 107 mmol/L — ABNORMAL HIGH (ref 96–106)
Creatinine, Ser: 0.77 mg/dL (ref 0.76–1.27)
Globulin, Total: 2.1 g/dL (ref 1.5–4.5)
Glucose: 114 mg/dL — ABNORMAL HIGH (ref 65–99)
Potassium: 4.5 mmol/L (ref 3.5–5.2)
Sodium: 143 mmol/L (ref 134–144)
Total Protein: 6.4 g/dL (ref 6.0–8.5)
eGFR: 90 mL/min/{1.73_m2} (ref >59)

## 2021-01-20 LAB — HEMOGLOBIN A1C
Est. average glucose Bld gHb Est-mCnc: 126 mg/dL
Hgb A1c MFr Bld: 6 % — ABNORMAL HIGH (ref 4.8–5.6)

## 2021-01-20 LAB — LIPID PANEL
Chol/HDL Ratio: 3.7 ratio (ref 0.0–5.0)
Cholesterol, Total: 157 mg/dL (ref 100–199)
HDL: 42 mg/dL (ref >39)
LDL Chol Calc (NIH): 93 mg/dL (ref 0–99)
Triglycerides: 122 mg/dL (ref 0–149)
VLDL Cholesterol Cal: 22 mg/dL (ref 5–40)

## 2021-01-20 LAB — PSA: Prostate Specific Ag, Serum: 5.1 ng/mL — ABNORMAL HIGH (ref 0.0–4.0)

## 2021-01-24 DIAGNOSIS — H40003 Preglaucoma, unspecified, bilateral: Secondary | ICD-10-CM | POA: Diagnosis not present

## 2021-02-09 ENCOUNTER — Telehealth: Payer: Self-pay

## 2021-02-09 NOTE — Progress Notes (Signed)
Left message for patient to call back and schedule Medicare Annual Wellness Visit (AWV).    Please offer to do virtually or by telephone sometime this week    Last AWV: 01/04/2020    Please schedule at anytime with [facility] Health Advisor.    If any questions, please contact me at [place], [place], [place], [place] [address] Direct Dial: [phone] [email or web address]

## 2021-03-27 DIAGNOSIS — E119 Type 2 diabetes mellitus without complications: Secondary | ICD-10-CM | POA: Diagnosis not present

## 2021-03-27 DIAGNOSIS — E782 Mixed hyperlipidemia: Secondary | ICD-10-CM | POA: Diagnosis not present

## 2021-03-27 DIAGNOSIS — I10 Essential (primary) hypertension: Secondary | ICD-10-CM | POA: Diagnosis not present

## 2021-03-27 DIAGNOSIS — R55 Syncope and collapse: Secondary | ICD-10-CM | POA: Diagnosis not present

## 2021-03-27 DIAGNOSIS — I480 Paroxysmal atrial fibrillation: Secondary | ICD-10-CM | POA: Diagnosis not present

## 2021-03-27 DIAGNOSIS — R001 Bradycardia, unspecified: Secondary | ICD-10-CM | POA: Diagnosis not present

## 2021-03-27 DIAGNOSIS — I4510 Unspecified right bundle-branch block: Secondary | ICD-10-CM | POA: Diagnosis not present

## 2021-04-21 ENCOUNTER — Other Ambulatory Visit: Payer: Self-pay | Admitting: Family Medicine

## 2021-04-21 NOTE — Telephone Encounter (Signed)
Requested Prescriptions  Pending Prescriptions Disp Refills  . losartan (COZAAR) 100 MG tablet [Pharmacy Med Name: LOSARTAN POTASSIUM 100 MG TAB] 90 tablet 0    Sig: TAKE 1 TABLET (100 MG TOTAL) BY MOUTH DAILY IN THE MORNING (NEED OFFICE VISIT)     Cardiovascular:  Angiotensin Receptor Blockers Passed - 04/21/2021  1:24 AM      Passed - Cr in normal range and within 180 days    Creatinine, Ser  Date Value Ref Range Status  01/19/2021 0.77 0.76 - 1.27 mg/dL Final         Passed - K in normal range and within 180 days    Potassium  Date Value Ref Range Status  01/19/2021 4.5 3.5 - 5.2 mmol/L Final         Passed - Patient is not pregnant      Passed - Last BP in normal range    BP Readings from Last 1 Encounters:  01/19/21 127/63         Passed - Valid encounter within last 6 months    Recent Outpatient Visits          3 months ago Type 2 diabetes mellitus with diabetic neuropathic arthropathy, without long-term current use of insulin [facility])   [facility] [name]., MD   9 months ago Annual physical exam   [facility] [name]., MD   11 months ago Chronic left shoulder pain   [place], Vickki Muff, PA-C   1 year ago Gastroesophageal reflux disease, unspecified whether esophagitis present   [facility] [place], Kelby Aline, FNP   1 year ago Type 2 diabetes mellitus with diabetic neuropathic arthropathy, without long-term current use of insulin [facility])   [facility] [name]., MD      Future Appointments            In 1 month [place], Jake Church, DO [facility] [place]

## 2021-05-10 ENCOUNTER — Ambulatory Visit: Payer: Self-pay

## 2021-05-10 ENCOUNTER — Telehealth: Payer: Medicare PPO | Admitting: Physician Assistant

## 2021-05-10 DIAGNOSIS — J111 Influenza due to unidentified influenza virus with other respiratory manifestations: Secondary | ICD-10-CM

## 2021-05-10 MED ORDER — OSELTAMIVIR PHOSPHATE 75 MG PO CAPS: 75.0000 mg | ORAL_CAPSULE | Freq: Two times a day (BID) | ORAL | 0 refills | Status: DC

## 2021-05-10 NOTE — Telephone Encounter (Signed)
Patient called to triage, as I asked him about the cough, he says he did an e-visit and just received a text message that he was being prescribed Tamiflu. He says thank you for your call.    Summary: cough   Pt has developed and cough and feels like he is losing his voice and didn't get much sleep last nigh / pt asked if he could described his symptoms can a nurse give him advice since no appt was open until Friday / please advise      Answer Assessment - Initial Assessment Questions 1. ONSET: "When did the cough begin?"      Yesterday 2. SEVERITY: "How bad is the cough today?"      Gotten worse 3. SPUTUM: "Describe the color of your sputum" (none, dry cough; clear, white, yellow, green)     N/A 4. HEMOPTYSIS: "Are you coughing up any blood?" If so ask: "How much?" (flecks, streaks, tablespoons, etc.)     N/A 5. DIFFICULTY BREATHING: "Are you having difficulty breathing?" If Yes, ask: "How bad is it?" (e.g., mild, moderate, severe)    - MILD: No SOB at rest, mild SOB with walking, speaks normally in sentences, can lie down, no retractions, pulse < 100.    - MODERATE: SOB at rest, SOB with minimal exertion and prefers to sit, cannot lie down flat, speaks in phrases, mild retractions, audible wheezing, pulse 100-120.    - SEVERE: Very SOB at rest, speaks in single words, struggling to breathe, sitting hunched forward, retractions, pulse > 120      N/A 6. FEVER: "Do you have a fever?" If Yes, ask: "What is your temperature, how was it measured, and when did it start?"     N/A 7. CARDIAC HISTORY: "Do you have any history of heart disease?" (e.g., heart attack, congestive heart failure)      N/A 8. LUNG HISTORY: "Do you have any history of lung disease?"  (e.g., pulmonary embolus, asthma, emphysema)     N/A 9. PE RISK FACTORS: "Do you have a history of blood clots?" (or: recent major surgery, recent prolonged travel, bedridden)     N/A 10. OTHER SYMPTOMS: "Do you have any other symptoms?"  (e.g., runny nose, wheezing, chest pain)       N/A  Protocols used: Cough - Acute Non-Productive-A-AH

## 2021-05-10 NOTE — Progress Notes (Signed)
E visit for Flu like symptoms °  °We are sorry that you are not feeling well.  Here is how we plan to help! °Based on what you have shared with me it looks like you may have a respiratory virus that may be influenza. ° °Influenza or “the flu” is   an infection caused by a respiratory virus. The flu virus is highly contagious and persons who did not receive their yearly flu vaccination may “catch” the flu from close contact. ° °We have anti-viral medications to treat the viruses that cause this infection. They are not a “cure” and only shorten the course of the infection. These prescriptions are most effective when they are given within the first 2 days of “flu” symptoms. Antiviral medication are indicated if you have a high risk of complications from the flu. You should  also consider an antiviral medication if you are in close contact with someone who is at risk. These medications can help patients avoid complications from the flu  but have side effects that you should know. Possible side effects from Tamiflu or oseltamivir include nausea, vomiting, diarrhea, dizziness, headaches, eye redness, sleep problems or other respiratory symptoms. °You should not take Tamiflu if you have an allergy to oseltamivir or any to the ingredients in Tamiflu. ° °Based upon your symptoms and potential risk factors I have prescribed Oseltamivir (Tamiflu).  It has been sent to your designated pharmacy.  You will take one 75 mg capsule orally twice a day for the next 5 days. ° °ANYONE WHO HAS FLU SYMPTOMS SHOULD: °Stay home. The flu is highly contagious and going out or to work exposes others! °Be sure to drink plenty of fluids. Water is fine as well as fruit juices, sodas and electrolyte beverages. You may want to stay away from caffeine or alcohol. If you are nauseated, try taking small sips of liquids. How do you know if you are getting enough fluid? Your urine should be a pale yellow or almost colorless. °Get rest. °Taking a steamy  shower or using a humidifier may help nasal congestion and ease sore throat pain. Using a saline nasal spray works much the same way. °Cough drops, hard candies and sore throat lozenges may ease your cough. °Line up a caregiver. Have someone check on you regularly. ° ° °GET HELP RIGHT AWAY IF: °You cannot keep down liquids or your medications. °You become short of breath °Your fell like you are going to pass out or loose consciousness. °Your symptoms persist after you have completed your treatment plan °MAKE SURE YOU  °Understand these instructions. °Will watch your condition. °Will get help right away if you are not doing well or get worse. ° °Your e-visit answers were reviewed by a board certified advanced clinical practitioner to complete your personal care plan.  Depending on the condition, your plan could have included both over the counter or prescription medications. ° °If there is a problem please reply  once you have received a response from your provider. ° °Your safety is important to us.  If you have drug allergies check your prescription carefully.   ° °You can use MyChart to ask questions about today’s visit, request a non-urgent call back, or ask for a work or school excuse for 24 hours related to this e-Visit. If it has been greater than 24 hours you will need to follow up with your provider, or enter a new e-Visit to address those concerns. ° °You will get an e-mail in the next   two days asking about your experience.  I hope that your e-visit has been valuable and will speed your recovery. Thank you for using e-visits.  I provided 5 minutes of non face-to-face time during this encounter for chart review and documentation.

## 2021-05-11 ENCOUNTER — Telehealth: Payer: Medicare PPO | Admitting: Physician Assistant

## 2021-05-11 ENCOUNTER — Other Ambulatory Visit: Payer: Self-pay | Admitting: Family Medicine

## 2021-05-11 ENCOUNTER — Other Ambulatory Visit: Payer: Self-pay

## 2021-05-11 ENCOUNTER — Telehealth: Payer: Self-pay | Admitting: Family Medicine

## 2021-05-11 DIAGNOSIS — U071 COVID-19: Secondary | ICD-10-CM

## 2021-05-11 MED ORDER — PAXLOVID (300/100) 20 X 150 MG & 10 X 100MG PO TBPK
3.0000 | ORAL_TABLET | Freq: Two times a day (BID) | ORAL | 0 refills | Status: DC
  Filled 2021-05-11: qty 30, 5d supply, fill #0

## 2021-05-11 MED ORDER — NIRMATRELVIR/RITONAVIR (PAXLOVID)TABLET: 3.0000 | ORAL_TABLET | Freq: Two times a day (BID) | ORAL | 0 refills | Status: AC

## 2021-05-11 NOTE — Telephone Encounter (Signed)
Pam from the pharmacy called to report that there is a drug interference with the Paxlovid. She recommends switching the Rx to [facility]

## 2021-05-11 NOTE — Telephone Encounter (Signed)
Patient checking on the status of message below. Informed patient awaiting a response from NP. Patient would like this taken care prior to [place] closing. Patient did request a follow up call.

## 2021-05-11 NOTE — Telephone Encounter (Signed)
Gave phone number to [place] to talk to pharmacist there for possible antiviral treatment. Patient to call back with any questions or concerns.

## 2021-05-11 NOTE — Telephone Encounter (Signed)
Please review and advise. Thanks.  

## 2021-05-11 NOTE — Progress Notes (Signed)
Based on the information that you have shared in the e-Visit Questionnaire, we recommend that you convert this visit to a video visit in order for the provider to better assess what is going on.  The provider will be able to give you a more accurate diagnosis and treatment plan if we can more freely discuss your symptoms and with the addition of a virtual examination.   We do require a Video Visit for prescribing the antiviral medications.   If you convert to a video visit, we will bill your insurance (similar to an office visit) and you will not be charged for this e-Visit. You will be able to stay at home and speak with the first available [facility] advanced practice provider. The link to do a video visit is in the drop down Menu tab of your Welcome screen in [place].

## 2021-05-11 NOTE — Telephone Encounter (Signed)
Pt. Tested positive for COVID 19 last night. States he needs "to be on camera to be prescribed the anti-viral, but I can't do that." Requesting anti-viral be sent to pharmacy. States "I don't want Paxlovid, I want the other one." Please advise pt.

## 2021-05-12 ENCOUNTER — Ambulatory Visit: Payer: Self-pay

## 2021-05-12 NOTE — Telephone Encounter (Signed)
Patient advised as below.  

## 2021-05-25 ENCOUNTER — Ambulatory Visit: Payer: Medicare PPO | Admitting: Family Medicine

## 2021-05-30 ENCOUNTER — Ambulatory Visit: Payer: Medicare PPO | Admitting: Family Medicine

## 2021-05-30 ENCOUNTER — Encounter: Payer: Self-pay | Admitting: Family Medicine

## 2021-05-30 ENCOUNTER — Other Ambulatory Visit: Payer: Self-pay

## 2021-05-30 VITALS — BP 124/72 | HR 67 | Temp 98.3°F | Resp 16 | Ht 74.0 in | Wt 215.0 lb

## 2021-05-30 DIAGNOSIS — E7800 Pure hypercholesterolemia, unspecified: Secondary | ICD-10-CM | POA: Diagnosis not present

## 2021-05-30 DIAGNOSIS — K219 Gastro-esophageal reflux disease without esophagitis: Secondary | ICD-10-CM | POA: Diagnosis not present

## 2021-05-30 DIAGNOSIS — I480 Paroxysmal atrial fibrillation: Secondary | ICD-10-CM

## 2021-05-30 DIAGNOSIS — E119 Type 2 diabetes mellitus without complications: Secondary | ICD-10-CM

## 2021-05-30 DIAGNOSIS — E11610 Type 2 diabetes mellitus with diabetic neuropathic arthropathy: Secondary | ICD-10-CM

## 2021-05-30 DIAGNOSIS — I10 Essential (primary) hypertension: Secondary | ICD-10-CM

## 2021-05-30 DIAGNOSIS — R5383 Other fatigue: Secondary | ICD-10-CM | POA: Insufficient documentation

## 2021-05-30 DIAGNOSIS — R972 Elevated prostate specific antigen [PSA]: Secondary | ICD-10-CM | POA: Diagnosis not present

## 2021-05-30 LAB — POCT GLYCOSYLATED HEMOGLOBIN (HGB A1C)
Est. average glucose Bld gHb Est-mCnc: 126
Hemoglobin A1C: 6 % — AB (ref 4.0–5.6)

## 2021-05-30 NOTE — Assessment & Plan Note (Signed)
Tolerant of statin, continue. 

## 2021-05-30 NOTE — Assessment & Plan Note (Signed)
a1c at goal, no changes. Foot exam wnl today.

## 2021-05-30 NOTE — Assessment & Plan Note (Signed)
At goal. No changes. Obtaining labs.

## 2021-05-30 NOTE — Assessment & Plan Note (Signed)
Doing well on current regimen, encouraged prn use given potential harms of long term PPI use.

## 2021-05-30 NOTE — Assessment & Plan Note (Signed)
Reg rhythm today, rate controlled. Continue eliquis.

## 2021-05-30 NOTE — Progress Notes (Signed)
° °  SUBJECTIVE:   CHIEF COMPLAINT / HPI:   Hypertension, Afib, RBBB: - Medications: losartan, cardizem, eliquis - Compliance: good - Checking BP at home: yes, 130s SBP - Denies any SOB, CP, vision changes, LE edema, medication SEs, or symptoms of hypotension - no blood in urine, stool, nose bleeds.   Diabetes, Type 2 - Last A1c 6.0 01/2021 - Medications: actos - Compliance: good - Checking BG at home: not often.  - Eye exam: UTD - Foot exam: due - Statin: yes - PNA vaccine: yes - Denies symptoms of hypoglycemia, polyuria, polydipsia, numbness extremities, foot ulcers/trauma  HLD - medications: simvastatin - compliance: good - medication SEs: none  GERD - Meds: omeprazole daily - Symptoms:  belching.  -  denies dysphagia has not lost weight denies melena, hematochezia, hematemesis, and coffee ground emesis.   Recent COVID - symptom onset 12/28. Did well with paxlovid. Some ongoing fatigue. Sleeping ok at night. Snores at night. Reports prior sleep study ~20 years ago, could not tolerate CPAP.   Elevated PSA - previously evaluated by [facility], years ago. Urinating ok, some dribbling in morning with good stream afterwards. No nocturia. Declines intervention.  OBJECTIVE:   BP 124/72 (BP Location: Left Arm, Patient Position: Sitting, Cuff Size: Large)    Pulse 67    Temp 98.3 F (36.8 C) (Temporal)    Resp 16    Ht 6\' 2"  (1.88 m)    Wt 215 lb (97.5 kg)    SpO2 96%    BMI 27.60 kg/m   Gen: well appearing, in NAD Card: RRR Lungs: CTAB Ext: WWP, no edema   ASSESSMENT/PLAN:   Essential (primary) hypertension At goal. No changes. Obtaining labs.  Paroxysmal atrial fibrillation (HCC) Reg rhythm today, rate controlled. Continue eliquis.  Gastroesophageal reflux disease Doing well on current regimen, encouraged prn use given potential harms of long term PPI use.   Type 2 diabetes mellitus with diabetic neuropathic arthropathy a1c at goal, no changes. Foot exam wnl  today.  Abnormal prostate specific antigen Recheck PSA  Fatigue Persistent s/p COVID 3 weeks ago, with daytime sleepiness. Will obtain labs to assess for other contributors. If labs unrevealing and persistence >1 month out from COVID, recommend sleep study to assess for OSA given snoring history, STOP-BANG 5 points, high risk for OSA.   Hyperlipidemia, mixed Tolerant of statin, continue.     Myles Gip, DO

## 2021-05-30 NOTE — Assessment & Plan Note (Signed)
Persistent s/p COVID 3 weeks ago, with daytime sleepiness. Will obtain labs to assess for other contributors. If labs unrevealing and persistence >1 month out from COVID, recommend sleep study to assess for OSA given snoring history, STOP-BANG 5 points, high risk for OSA.

## 2021-05-30 NOTE — Assessment & Plan Note (Signed)
Recheck PSA 

## 2021-05-31 LAB — CBC WITH DIFFERENTIAL/PLATELET
Basophils Absolute: 0.1 10*3/uL (ref 0.0–0.2)
Basos: 1 %
EOS (ABSOLUTE): 0.1 10*3/uL (ref 0.0–0.4)
Eos: 1 %
Hematocrit: 48.1 % (ref 37.5–51.0)
Hemoglobin: 15.6 g/dL (ref 13.0–17.7)
Immature Grans (Abs): 0 10*3/uL (ref 0.0–0.1)
Immature Granulocytes: 0 %
Lymphocytes Absolute: 2.2 10*3/uL (ref 0.7–3.1)
Lymphs: 29 %
MCH: 29.3 pg (ref 26.6–33.0)
MCHC: 32.4 g/dL (ref 31.5–35.7)
MCV: 90 fL (ref 79–97)
Monocytes Absolute: 0.7 10*3/uL (ref 0.1–0.9)
Monocytes: 10 %
Neutrophils Absolute: 4.6 10*3/uL (ref 1.4–7.0)
Neutrophils: 59 %
Platelets: 189 10*3/uL (ref 150–450)
RBC: 5.33 x10E6/uL (ref 4.14–5.80)
RDW: 12.4 % (ref 11.6–15.4)
WBC: 7.8 10*3/uL (ref 3.4–10.8)

## 2021-05-31 LAB — BASIC METABOLIC PANEL
BUN/Creatinine Ratio: 17 (ref 10–24)
BUN: 13 mg/dL (ref 8–27)
CO2: 26 mmol/L (ref 20–29)
Calcium: 9.3 mg/dL (ref 8.6–10.2)
Chloride: 106 mmol/L (ref 96–106)
Creatinine, Ser: 0.75 mg/dL — ABNORMAL LOW (ref 0.76–1.27)
Glucose: 128 mg/dL — ABNORMAL HIGH (ref 70–99)
Potassium: 4.5 mmol/L (ref 3.5–5.2)
Sodium: 144 mmol/L (ref 134–144)
eGFR: 91 mL/min/{1.73_m2} (ref >59)

## 2021-05-31 LAB — VITAMIN B12: Vitamin B-12: 348 pg/mL (ref 232–1245)

## 2021-05-31 LAB — PSA: Prostate Specific Ag, Serum: 5.7 ng/mL — ABNORMAL HIGH (ref 0.0–4.0)

## 2021-05-31 LAB — TSH: TSH: 0.71 u[IU]/mL (ref 0.450–4.500)

## 2021-07-16 ENCOUNTER — Other Ambulatory Visit: Payer: Self-pay | Admitting: Family Medicine

## 2021-07-26 DIAGNOSIS — H40003 Preglaucoma, unspecified, bilateral: Secondary | ICD-10-CM | POA: Diagnosis not present

## 2021-07-26 LAB — HM DIABETES EYE EXAM

## 2021-07-27 ENCOUNTER — Other Ambulatory Visit: Payer: Self-pay | Admitting: Family Medicine

## 2021-07-31 ENCOUNTER — Other Ambulatory Visit: Payer: Self-pay | Admitting: Family Medicine

## 2021-08-21 ENCOUNTER — Other Ambulatory Visit: Payer: Self-pay | Admitting: Family Medicine

## 2021-09-01 ENCOUNTER — Other Ambulatory Visit: Payer: Self-pay | Admitting: Family Medicine

## 2021-09-01 DIAGNOSIS — E11610 Type 2 diabetes mellitus with diabetic neuropathic arthropathy: Secondary | ICD-10-CM

## 2021-09-01 NOTE — Telephone Encounter (Signed)
Requested Prescriptions  ?Pending Prescriptions Disp Refills  ?? pioglitazone (ACTOS) 30 MG tablet [Pharmacy Med Name: PIOGLITAZONE HCL 30 MG TABLET] 90 tablet 0  ?  Sig: TAKE 1 TABLET BY MOUTH EVERY DAY IN THE MORNING  ?  ? Endocrinology:  Diabetes - Glitazones - pioglitazone Passed - 09/01/2021  2:52 AM  ?  ?  Passed - HBA1C is between 0 and 7.9 and within 180 days  ?  Hemoglobin A1C  ?Date Value Ref Range Status  ?05/30/2021 6.0 (A) 4.0 - 5.6 % Final  ? ?Hgb A1c MFr Bld  ?Date Value Ref Range Status  ?01/19/2021 6.0 (H) 4.8 - 5.6 % Final  ?  Comment:  ?           Prediabetes: 5.7 - 6.4 ?         Diabetes: >6.4 ?         Glycemic control for adults with diabetes: <7.0 ?  ?   ?  ?  Passed - Valid encounter within last 6 months  ?  Recent Outpatient Visits   ?      ? 3 months ago Type 2 diabetes mellitus with diabetic neuropathic arthropathy, without long-term current use of insulin ([place])  ? [place], DO  ? 7 months ago Type 2 diabetes mellitus with diabetic neuropathic arthropathy, without long-term current use of insulin ([place])  ? [facility] [name]., MD  ? 1 year ago Annual physical exam  ? [facility] [name]., MD  ? 1 year ago Chronic left shoulder pain  ? [place], PA-C  ? 1 year ago Gastroesophageal reflux disease, unspecified whether esophagitis present  ? [place], FNP  ?  ?  ? ?  ?  ?  ? ? ?

## 2021-09-08 ENCOUNTER — Other Ambulatory Visit: Payer: Self-pay | Admitting: Family Medicine

## 2021-09-22 DIAGNOSIS — K219 Gastro-esophageal reflux disease without esophagitis: Secondary | ICD-10-CM | POA: Diagnosis not present

## 2021-09-22 DIAGNOSIS — I10 Essential (primary) hypertension: Secondary | ICD-10-CM | POA: Diagnosis not present

## 2021-09-22 DIAGNOSIS — E119 Type 2 diabetes mellitus without complications: Secondary | ICD-10-CM | POA: Diagnosis not present

## 2021-09-22 DIAGNOSIS — D6869 Other thrombophilia: Secondary | ICD-10-CM | POA: Diagnosis not present

## 2021-09-22 DIAGNOSIS — M1990 Unspecified osteoarthritis, unspecified site: Secondary | ICD-10-CM | POA: Diagnosis not present

## 2021-09-22 DIAGNOSIS — I4891 Unspecified atrial fibrillation: Secondary | ICD-10-CM | POA: Diagnosis not present

## 2021-09-22 DIAGNOSIS — E785 Hyperlipidemia, unspecified: Secondary | ICD-10-CM | POA: Diagnosis not present

## 2021-09-22 DIAGNOSIS — E663 Overweight: Secondary | ICD-10-CM | POA: Diagnosis not present

## 2021-09-22 DIAGNOSIS — H409 Unspecified glaucoma: Secondary | ICD-10-CM | POA: Diagnosis not present

## 2021-09-25 DIAGNOSIS — I480 Paroxysmal atrial fibrillation: Secondary | ICD-10-CM | POA: Diagnosis not present

## 2021-09-25 DIAGNOSIS — E119 Type 2 diabetes mellitus without complications: Secondary | ICD-10-CM | POA: Diagnosis not present

## 2021-09-25 DIAGNOSIS — I10 Essential (primary) hypertension: Secondary | ICD-10-CM | POA: Diagnosis not present

## 2021-09-25 DIAGNOSIS — R55 Syncope and collapse: Secondary | ICD-10-CM | POA: Diagnosis not present

## 2021-09-25 DIAGNOSIS — R001 Bradycardia, unspecified: Secondary | ICD-10-CM | POA: Diagnosis not present

## 2021-09-25 DIAGNOSIS — I4510 Unspecified right bundle-branch block: Secondary | ICD-10-CM | POA: Diagnosis not present

## 2021-09-25 DIAGNOSIS — E782 Mixed hyperlipidemia: Secondary | ICD-10-CM | POA: Diagnosis not present

## 2021-10-24 NOTE — Progress Notes (Signed)
Annual Wellness Visit    I,Roberto Leonard,acting as a scribe for Roberto Durie, MD.,have documented all relevant documentation on the behalf of Roberto Durie, MD,as directed by  Roberto Durie, MD while in the presence of Roberto Durie, MD.   Patient: Roberto Leonard, Male    DOB: 1940-12-25, 81 y.o.   MRN: [id number] Visit Date: 10/25/2021  Today's Provider: [name], MD   Chief Complaint  Patient presents with   Medicare Wellness   Subjective    SAED Roberto Leonard is a 81 y.o. male who presents today for his Annual Wellness Visit.Annual Physical. He reports consuming a general diet. Home exercise routine includes walking. He generally feels well. He reports sleeping well. He does not have additional problems to discuss today.   HPI He complains of left shoulder left knee and left thumb pain.  He developed some discomfort in his right flank after washing windows this morning. He has had some mild nocturnal hot flashes for the past year.    Medications: Outpatient Medications Prior to Visit  Medication Sig   brimonidine (ALPHAGAN) 0.2 % ophthalmic solution Place 1 drop into both eyes 2 (two) times daily.   Cetirizine HCl 10 MG CAPS Take 10 mg by mouth daily.   cholecalciferol (VITAMIN D) 1000 units tablet Take 1,000 Units by mouth daily.   diltiazem (CARDIZEM CD) 180 MG 24 hr capsule Take 180 mg by mouth daily.    dorzolamide-timolol (COSOPT) 22.3-6.8 MG/ML ophthalmic solution INSTILL 1 DROP INTO BOTH EYES TWICE A DAY   ELIQUIS 5 MG TABS tablet TAKE 1 TABLET BY MOUTH TWICE A DAY   glucose blood test strip Use with meter to test daily and as needfed   losartan (COZAAR) 100 MG tablet TAKE 1 TABLET (100 MG TOTAL) BY MOUTH DAILY IN THE MORNING (NEED OFFICE VISIT)   Multiple Vitamin (MULTIVITAMIN WITH MINERALS) TABS tablet Take 1 tablet by mouth daily.   Omega-3 Fatty Acids (FISH OIL BURP-LESS) 1200 MG CAPS Take 2 capsules by mouth every evening.     omeprazole (PRILOSEC) 40 MG capsule TAKE 1 CAPSULE BY MOUTH EVERY DAY   pioglitazone (ACTOS) 30 MG tablet TAKE 1 TABLET BY MOUTH EVERY DAY IN THE MORNING   simvastatin (ZOCOR) 20 MG tablet TAKE 1 TABLET BY MOUTH EVERYDAY AT BEDTIME   [DISCONTINUED] neomycin-polymyxin b-dexamethasone (MAXITROL) 3.5-10000-0.1 SUSP 1 drop 3 (three) times daily.    [DISCONTINUED] oseltamivir (TAMIFLU) 75 MG capsule Take 1 capsule (75 mg total) by mouth 2 (two) times daily.   No facility-administered medications prior to visit.    Allergies  Allergen Reactions   Sulfa Antibiotics Other (See Comments)    SWEATS, bad headache SWEATS, bad headache    Patient Care Team: Roberto Leonard., MD as PCP - General (Family Medicine) Roberto Sprang, MD as Consulting Physician (Cardiology) Roberto Cowman, MD as Consulting Physician (Cardiology) Dingeldein, Remo Lipps, MD (Ophthalmology)  Review of Systems  HENT:  Positive for dental problem and hearing loss.   Genitourinary:  Positive for urgency.  Allergic/Immunologic: Positive for environmental allergies.  Hematological:  Bruises/bleeds easily.  All other systems reviewed and are negative.   Last hemoglobin A1c Lab Results  Component Value Date   HGBA1C 6.5 (H) 10/26/2021        Objective    Vitals: BP (!) 142/80 (BP Location: Right Arm, Patient Position: Sitting, Cuff Size: Normal)   Pulse 66   Resp 16   Ht '6\' 2"'$  (1.88  m)   Wt 215 lb (97.5 kg)   SpO2 96%   BMI 27.60 kg/m  BP Readings from Last 3 Encounters:  10/25/21 (!) 142/80  05/30/21 124/72  01/19/21 127/63   Wt Readings from Last 3 Encounters:  10/25/21 215 lb (97.5 kg)  05/30/21 215 lb (97.5 kg)  01/19/21 210 lb (95.3 kg)       Physical Exam Vitals reviewed.  Constitutional:      Appearance: Normal appearance.     Comments: Patient appears younger than his age .  HENT:     Head: Normocephalic and atraumatic.     Right Ear: External ear normal.     Left Ear: External  ear normal.     Nose: Nose normal.     Mouth/Throat:     Pharynx: Oropharynx is clear.  Eyes:     General: No scleral icterus.    Conjunctiva/sclera: Conjunctivae normal.  Cardiovascular:     Rate and Rhythm: Normal rate and regular rhythm.     Pulses: Normal pulses.     Heart sounds: Normal heart sounds.  Pulmonary:     Effort: Pulmonary effort is normal.     Breath sounds: Normal breath sounds.  Abdominal:     Palpations: Abdomen is soft.  Musculoskeletal:     Right lower leg: No edema.     Left lower leg: No edema.  Lymphadenopathy:     Cervical: No cervical adenopathy.  Skin:    General: Skin is warm and dry.     Comments: Fair skin.  Neurological:     Mental Status: He is alert and oriented to person, place, and time. Mental status is at baseline.  Psychiatric:        Mood and Affect: Mood normal.        Behavior: Behavior normal.        Thought Content: Thought content normal.        Judgment: Judgment normal.      Most recent functional status assessment:    10/25/2021    2:03 PM  In your present state of health, do you have any difficulty performing the following activities:  Hearing? 1  Vision? 0  Difficulty concentrating or making decisions? 1  Walking or climbing stairs? 0  Dressing or bathing? 0  Doing errands, shopping? 0   Most recent fall risk assessment:    10/25/2021    2:02 PM  Fall Risk   Falls in the past year? 0  Number falls in past yr: 0  Injury with Fall? 0  Risk for fall due to : No Fall Risks  Follow up Falls evaluation completed    Most recent depression screenings:    10/25/2021    2:02 PM 05/30/2021    1:33 PM  PHQ 2/9 Scores  PHQ - 2 Score 0 0  PHQ- 9 Score 1 0   Most recent cognitive screening:    12/31/2018   10:36 AM  6CIT Screen  What Year? 0 points  What month? 0 points  What time? 0 points  Count back from 20 0 points  Months in reverse 0 points  Repeat phrase 0 points  Total Score 0 points   Most recent  Audit-C alcohol use screening    10/25/2021    2:02 PM  Alcohol Use Disorder Test (AUDIT)  1. How often do you have a drink containing alcohol? 0  2. How many drinks containing alcohol do you have on a typical day when you are  drinking? 0  3. How often do you have six or more drinks on one occasion? 0  AUDIT-C Score 0   A score of 3 or more in women, and 4 or more in men indicates increased risk for alcohol abuse, EXCEPT if all of the points are from question 1   No results found for any visits on 10/25/21.  Assessment & Plan     Annual wellness visit done today including the all of the following: Reviewed patient's Family Medical History Reviewed and updated list of patient's medical providers Assessment of cognitive impairment was done Assessed patient's functional ability Established a written schedule for health screening [place] Completed and Reviewed  Exercise Activities and Dietary recommendations  Goals      DIET - INCREASE WATER INTAKE     Recommend increasing water intake to 4-6 glasses a day.         Immunization History  Administered Date(s) Administered   Fluad Quad(high Dose 65+) 01/19/2021   Hepatitis B, adult 05/16/2016, 08/29/2016, 11/19/2016   Influenza Split 01/27/2016   Influenza, High Dose Seasonal PF 02/10/2015, 01/15/2018, 12/25/2018, 01/12/2020   Influenza-Unspecified 01/29/2017, 01/15/2018, 12/25/2018, 01/11/2020   PFIZER Comirnaty(Gray Top)Covid-19 Tri-Sucrose Vaccine 08/12/2020   PFIZER(Purple Top)SARS-COV-2 Vaccination 05/18/2019, 06/08/2019, 01/26/2020   Pneumococcal Conjugate-13 01/12/2014   Pneumococcal Polysaccharide-23 06/23/1998, 03/08/2005   Td 05/21/2006, 09/16/2018   Zoster Recombinat (Shingrix) 08/30/2016, 11/10/2016   Zoster, Live 09/12/2005    Health Maintenance  Topic Date Due   COVID-19 Vaccine (5 - Pfizer series) 10/07/2020   HEMOGLOBIN A1C  11/27/2021   INFLUENZA VACCINE  12/12/2021   FOOT EXAM   05/30/2022   OPHTHALMOLOGY EXAM  07/27/2022   COLONOSCOPY (Pts 45-62yr Insurance coverage will need to be confirmed)  10/19/2024   TETANUS/TDAP  09/15/2028   Pneumonia Vaccine 81 Years old  Completed   Zoster Vaccines- Shingrix  Completed   HPV VACCINES  Aged Out     Discussed health benefits of physical activity, and encouraged him to engage in regular exercise appropriate for his age and condition.   1. Encounter for Medicare annual wellness exam  - CBC w/Diff/Platelet - Comprehensive Metabolic Panel (CMET) - Lipid panel - Hemoglobin A1c  2. Annual physical exam   3. Type 2 diabetes mellitus with diabetic neuropathic arthropathy, without long-term current use of insulin ([place] Continue to work on lifestyle - CBC w/Diff/Platelet - Comprehensive Metabolic Panel (CMET) - Lipid panel - Hemoglobin A1c  4. Essential (primary) hypertension  - CBC w/Diff/Platelet - Comprehensive Metabolic Panel (CMET) - Lipid panel - Hemoglobin A1c  5. Hypercholesteremia  - CBC w/Diff/Platelet - Comprehensive Metabolic Panel (CMET) - Lipid panel - Hemoglobin A1c  6. Chronic atrial fibrillation (HCC)  - CBC w/Diff/Platelet - Comprehensive Metabolic Panel (CMET) - Lipid panel - Hemoglobin A1c  7. Elevated PSA  - PSA  8. Gastroesophageal reflux disease, unspecified whether esophagitis present  - CBC w/Diff/Platelet - Comprehensive Metabolic Panel (CMET) - Lipid panel - Hemoglobin A1c  9. Osteoarthritis, unspecified osteoarthritis type, unspecified site Over-the-counter turmeric daily - CBC w/Diff/Platelet - Comprehensive Metabolic Panel (CMET) - Lipid panel - Hemoglobin A1c  10. Paroxysmal atrial fibrillation (HCC)   11. Dupuytren's disease of palm   12. Abnormal prostate specific antigen   13. Chronic left shoulder pain    Return in about 6 months (around 04/26/2022).     I, RWilhemena Durie MD, have reviewed all documentation for this visit. The  documentation on 10/31/21 for the exam, diagnosis, procedures, and  orders are all accurate and complete.    Maryetta Shafer Cranford Mon, MD  [facility] [phone] (phone) [phone] (fax)  [place]

## 2021-10-25 ENCOUNTER — Encounter: Payer: Self-pay | Admitting: Family Medicine

## 2021-10-25 ENCOUNTER — Ambulatory Visit (INDEPENDENT_AMBULATORY_CARE_PROVIDER_SITE_OTHER): Payer: Medicare PPO | Admitting: Family Medicine

## 2021-10-25 VITALS — BP 142/80 | HR 66 | Resp 16 | Ht 74.0 in | Wt 215.0 lb

## 2021-10-25 DIAGNOSIS — I480 Paroxysmal atrial fibrillation: Secondary | ICD-10-CM

## 2021-10-25 DIAGNOSIS — R972 Elevated prostate specific antigen [PSA]: Secondary | ICD-10-CM | POA: Diagnosis not present

## 2021-10-25 DIAGNOSIS — K219 Gastro-esophageal reflux disease without esophagitis: Secondary | ICD-10-CM | POA: Diagnosis not present

## 2021-10-25 DIAGNOSIS — G8929 Other chronic pain: Secondary | ICD-10-CM

## 2021-10-25 DIAGNOSIS — M720 Palmar fascial fibromatosis [Dupuytren]: Secondary | ICD-10-CM

## 2021-10-25 DIAGNOSIS — M25512 Pain in left shoulder: Secondary | ICD-10-CM

## 2021-10-25 DIAGNOSIS — I10 Essential (primary) hypertension: Secondary | ICD-10-CM | POA: Diagnosis not present

## 2021-10-25 DIAGNOSIS — E7800 Pure hypercholesterolemia, unspecified: Secondary | ICD-10-CM | POA: Diagnosis not present

## 2021-10-25 DIAGNOSIS — E11610 Type 2 diabetes mellitus with diabetic neuropathic arthropathy: Secondary | ICD-10-CM | POA: Diagnosis not present

## 2021-10-25 DIAGNOSIS — Z0000 Encounter for general adult medical examination without abnormal findings: Secondary | ICD-10-CM | POA: Diagnosis not present

## 2021-10-25 DIAGNOSIS — I482 Chronic atrial fibrillation, unspecified: Secondary | ICD-10-CM | POA: Diagnosis not present

## 2021-10-25 DIAGNOSIS — M1990 Unspecified osteoarthritis, unspecified site: Secondary | ICD-10-CM

## 2021-10-25 NOTE — Patient Instructions (Signed)
TRY OVER-THE-COUNTER TURMERIC DAILY.

## 2021-10-26 DIAGNOSIS — E11610 Type 2 diabetes mellitus with diabetic neuropathic arthropathy: Secondary | ICD-10-CM | POA: Diagnosis not present

## 2021-10-26 DIAGNOSIS — K219 Gastro-esophageal reflux disease without esophagitis: Secondary | ICD-10-CM | POA: Diagnosis not present

## 2021-10-26 DIAGNOSIS — E7800 Pure hypercholesterolemia, unspecified: Secondary | ICD-10-CM | POA: Diagnosis not present

## 2021-10-26 DIAGNOSIS — M1990 Unspecified osteoarthritis, unspecified site: Secondary | ICD-10-CM | POA: Diagnosis not present

## 2021-10-26 DIAGNOSIS — I482 Chronic atrial fibrillation, unspecified: Secondary | ICD-10-CM | POA: Diagnosis not present

## 2021-10-26 DIAGNOSIS — R972 Elevated prostate specific antigen [PSA]: Secondary | ICD-10-CM | POA: Diagnosis not present

## 2021-10-26 DIAGNOSIS — I10 Essential (primary) hypertension: Secondary | ICD-10-CM | POA: Diagnosis not present

## 2021-10-26 DIAGNOSIS — Z0000 Encounter for general adult medical examination without abnormal findings: Secondary | ICD-10-CM | POA: Diagnosis not present

## 2021-10-27 LAB — COMPREHENSIVE METABOLIC PANEL
ALT: 44 IU/L (ref 0–44)
AST: 30 IU/L (ref 0–40)
Albumin/Globulin Ratio: 1.9 (ref 1.2–2.2)
Albumin: 4 g/dL (ref 3.6–4.6)
Alkaline Phosphatase: 78 IU/L (ref 44–121)
BUN/Creatinine Ratio: 24 (ref 10–24)
BUN: 19 mg/dL (ref 8–27)
Bilirubin Total: 0.5 mg/dL (ref 0.0–1.2)
CO2: 22 mmol/L (ref 20–29)
Calcium: 9 mg/dL (ref 8.6–10.2)
Chloride: 105 mmol/L (ref 96–106)
Creatinine, Ser: 0.78 mg/dL (ref 0.76–1.27)
Globulin, Total: 2.1 g/dL (ref 1.5–4.5)
Glucose: 129 mg/dL — ABNORMAL HIGH (ref 70–99)
Potassium: 4.4 mmol/L (ref 3.5–5.2)
Sodium: 141 mmol/L (ref 134–144)
Total Protein: 6.1 g/dL (ref 6.0–8.5)
eGFR: 89 mL/min/{1.73_m2} (ref >59)

## 2021-10-27 LAB — CBC WITH DIFFERENTIAL/PLATELET
Basophils Absolute: 0.1 10*3/uL (ref 0.0–0.2)
Basos: 1 %
EOS (ABSOLUTE): 0.4 10*3/uL (ref 0.0–0.4)
Eos: 5 %
Hematocrit: 45.9 % (ref 37.5–51.0)
Hemoglobin: 15.6 g/dL (ref 13.0–17.7)
Immature Grans (Abs): 0 10*3/uL (ref 0.0–0.1)
Immature Granulocytes: 0 %
Lymphocytes Absolute: 1.7 10*3/uL (ref 0.7–3.1)
Lymphs: 23 %
MCH: 29.9 pg (ref 26.6–33.0)
MCHC: 34 g/dL (ref 31.5–35.7)
MCV: 88 fL (ref 79–97)
Monocytes Absolute: 0.8 10*3/uL (ref 0.1–0.9)
Monocytes: 11 %
Neutrophils Absolute: 4.3 10*3/uL (ref 1.4–7.0)
Neutrophils: 60 %
Platelets: 180 10*3/uL (ref 150–450)
RBC: 5.22 x10E6/uL (ref 4.14–5.80)
RDW: 12.2 % (ref 11.6–15.4)
WBC: 7.3 10*3/uL (ref 3.4–10.8)

## 2021-10-27 LAB — LIPID PANEL
Chol/HDL Ratio: 3.7 ratio (ref 0.0–5.0)
Cholesterol, Total: 137 mg/dL (ref 100–199)
HDL: 37 mg/dL — ABNORMAL LOW (ref >39)
LDL Chol Calc (NIH): 79 mg/dL (ref 0–99)
Triglycerides: 116 mg/dL (ref 0–149)
VLDL Cholesterol Cal: 21 mg/dL (ref 5–40)

## 2021-10-27 LAB — HEMOGLOBIN A1C
Est. average glucose Bld gHb Est-mCnc: 140 mg/dL
Hgb A1c MFr Bld: 6.5 % — ABNORMAL HIGH (ref 4.8–5.6)

## 2021-10-27 LAB — PSA: Prostate Specific Ag, Serum: 5.6 ng/mL — ABNORMAL HIGH (ref 0.0–4.0)

## 2021-11-29 ENCOUNTER — Other Ambulatory Visit: Payer: Self-pay | Admitting: Family Medicine

## 2021-11-29 DIAGNOSIS — E11610 Type 2 diabetes mellitus with diabetic neuropathic arthropathy: Secondary | ICD-10-CM

## 2022-01-18 ENCOUNTER — Other Ambulatory Visit: Payer: Self-pay | Admitting: Family Medicine

## 2022-01-18 DIAGNOSIS — D485 Neoplasm of uncertain behavior of skin: Secondary | ICD-10-CM | POA: Diagnosis not present

## 2022-01-18 DIAGNOSIS — Z85828 Personal history of other malignant neoplasm of skin: Secondary | ICD-10-CM | POA: Diagnosis not present

## 2022-01-18 DIAGNOSIS — X32XXXA Exposure to sunlight, initial encounter: Secondary | ICD-10-CM | POA: Diagnosis not present

## 2022-01-18 DIAGNOSIS — L820 Inflamed seborrheic keratosis: Secondary | ICD-10-CM | POA: Diagnosis not present

## 2022-01-18 DIAGNOSIS — D2261 Melanocytic nevi of right upper limb, including shoulder: Secondary | ICD-10-CM | POA: Diagnosis not present

## 2022-01-18 DIAGNOSIS — D2271 Melanocytic nevi of right lower limb, including hip: Secondary | ICD-10-CM | POA: Diagnosis not present

## 2022-01-18 DIAGNOSIS — L814 Other melanin hyperpigmentation: Secondary | ICD-10-CM | POA: Diagnosis not present

## 2022-01-18 DIAGNOSIS — D2339 Other benign neoplasm of skin of other parts of face: Secondary | ICD-10-CM | POA: Diagnosis not present

## 2022-01-18 DIAGNOSIS — L570 Actinic keratosis: Secondary | ICD-10-CM | POA: Diagnosis not present

## 2022-01-18 DIAGNOSIS — R208 Other disturbances of skin sensation: Secondary | ICD-10-CM | POA: Diagnosis not present

## 2022-01-18 DIAGNOSIS — D2262 Melanocytic nevi of left upper limb, including shoulder: Secondary | ICD-10-CM | POA: Diagnosis not present

## 2022-01-22 DIAGNOSIS — H40003 Preglaucoma, unspecified, bilateral: Secondary | ICD-10-CM | POA: Diagnosis not present

## 2022-04-18 ENCOUNTER — Ambulatory Visit: Payer: Medicare PPO | Admitting: Family Medicine

## 2022-07-11 ENCOUNTER — Encounter: Payer: Self-pay | Admitting: Urology

## 2022-07-11 ENCOUNTER — Ambulatory Visit: Payer: Medicare PPO | Admitting: Urology

## 2022-07-11 VITALS — BP 132/80 | HR 67 | Ht 74.0 in | Wt 215.0 lb

## 2022-07-11 DIAGNOSIS — R972 Elevated prostate specific antigen [PSA]: Secondary | ICD-10-CM

## 2022-07-11 NOTE — Progress Notes (Signed)
07/11/2022 2:42 PM   Al Corpus November 09, 1940 SU:82  Referring provider: Eulas Post, MD [address] [place],  [place] [id number]  Chief Complaint  Patient presents with   New Patient (Initial Visit)   Elevated PSA    HPI: Roberto Leonard is a 82 y.o. male referred for evaluation of an elevated PSA.    States PSA was in the 2 range for years and he saw Dr. Darcus Austin when it rose above 4.  I do not have the notes at that visit but he states he was told his PSA can be monitored until it rose above 7.  His PSA has been in the 5 range since 2016 PSA 06/07/2022 was 7.33 Patient presents today stating he wanted to schedule a biopsy    PMH: Past Medical History:  Diagnosis Date   Allergy    Detached retina    RIGHT   Diabetes mellitus without complication ([place])    ORAL MED   Dupuytren's contracture    ED (erectile dysfunction)    GERD (gastroesophageal reflux disease)    Glaucoma    Hyperlipidemia    Hypertension     Surgical History: Past Surgical History:  Procedure Laterality Date   CATARACT EXTRACTION     right   COLONOSCOPY WITH PROPOFOL N/A 01/03/2015   Procedure: COLONOSCOPY WITH PROPOFOL;  Surgeon: Lucilla Lame, MD;  Location: [facility];  Service: Endoscopy;  Laterality: N/A;  DIABETIC-ORAL MEDS   COLONOSCOPY WITH PROPOFOL N/A 10/20/2019   Procedure: COLONOSCOPY WITH PROPOFOL;  Surgeon: Lucilla Lame, MD;  Location: [facility] ENDOSCOPY;  Service: Endoscopy;  Laterality: N/A;   EYE SURGERY     HERNIA REPAIR     LOOP RECORDER INSERTION N/A 11/15/2016   Procedure: Loop Recorder Insertion;  Surgeon: Deboraha Sprang, MD;  Location: [facility] CV LAB;  Service: Cardiovascular;  Laterality: N/A;   POLYPECTOMY N/A 01/03/2015   Procedure: POLYPECTOMY;  Surgeon: Lucilla Lame, MD;  Location: [facility];  Service: Endoscopy;  Laterality: N/A;  Sigmoid polyp   RETINAL DETACHMENT SURGERY     right eye, wrong size lense, secondary surgery to replace  it    Home Medications:  Allergies as of 07/11/2022       Reactions   Sulfa Antibiotics Other (See Comments)   SWEATS, bad headache SWEATS, bad headache        Medication List        Accurate as of July 11, 2022  2:42 PM. If you have any questions, ask your nurse or doctor.          brimonidine 0.2 % ophthalmic solution Commonly known as: ALPHAGAN Place 1 drop into both eyes 2 (two) times daily.   Cetirizine HCl 10 MG Caps Take 10 mg by mouth daily.   cholecalciferol 1000 units tablet Commonly known as: VITAMIN D Take 1,000 Units by mouth daily.   diltiazem 180 MG 24 hr capsule Commonly known as: CARDIZEM CD Take 180 mg by mouth daily.   dorzolamide-timolol 2-0.5 % ophthalmic solution Commonly known as: COSOPT INSTILL 1 DROP INTO BOTH EYES TWICE A DAY   Eliquis 5 MG Tabs tablet Generic drug: apixaban TAKE 1 TABLET BY MOUTH TWICE A DAY   Fish Oil Burp-Less 1200 MG Caps Take 2 capsules by mouth every evening.   glucose blood test strip Use with meter to test daily and as needfed   losartan 100 MG tablet Commonly known as: COZAAR TAKE 1 TABLET (100 MG TOTAL) BY  MOUTH DAILY IN THE MORNING (NEED OFFICE VISIT)   multivitamin with minerals Tabs tablet Take 1 tablet by mouth daily.   omeprazole 40 MG capsule Commonly known as: PRILOSEC TAKE 1 CAPSULE BY MOUTH EVERY DAY   pioglitazone 30 MG tablet Commonly known as: ACTOS TAKE 1 TABLET BY MOUTH EVERY DAY IN THE MORNING   simvastatin 20 MG tablet Commonly known as: ZOCOR TAKE 1 TABLET BY MOUTH EVERYDAY AT BEDTIME        Allergies:  Allergies  Allergen Reactions   Sulfa Antibiotics Other (See Comments)    SWEATS, bad headache SWEATS, bad headache    Family History: Family History  Problem Relation Age of Onset   Heart disease Mother        Died from CHF   Diabetes Mother    Diabetes Father    Hypertension Father    Heart disease Father        plaque build up   Diabetes Brother     Diabetes Daughter        type 1    Social History:  reports that he quit smoking about 83 years ago. His smoking use included cigarettes. He has a 1.00 pack-year smoking history. He has been exposed to tobacco smoke. He has never used smokeless tobacco. He reports that he does not drink alcohol and does not use drugs.   Physical Exam: BP 132/80   Pulse 67   Ht '6\' 2"'$  (1.88 m)   Wt 215 lb (97.5 kg)   BMI 27.60 kg/m   Constitutional:  Alert and oriented, No acute distress. HEENT: [place] AT, moist mucus membranes.  Trachea midline, no masses. Cardiovascular: No clubbing, cyanosis, or edema. Respiratory: Normal respiratory effort, no increased work of breathing. GI: Abdomen is soft, nontender, nondistended, no abdominal masses GU: No CVA tenderness Skin: No rashes, bruises or suspicious lesions. Neurologic: Grossly intact, no focal deficits, moving all 4 extremities. Psychiatric: Normal mood and affect.  Laboratory Data: Lab Results  Component Value Date   WBC 7.3 10/26/2021   HGB 15.6 10/26/2021   HCT 45.9 10/26/2021   MCV 88 10/26/2021   PLT 180 10/26/2021    Lab Results  Component Value Date   CREATININE 0.78 10/26/2021    Lab Results  Component Value Date   PSA 3.9 03/29/2017   PSA 4.4 01/13/2014    No results found for: "TESTOSTERONE"  Lab Results  Component Value Date   HGBA1C 6.5 (H) 10/26/2021    Urinalysis    Component Value Date/Time   COLORURINE STRAW (A) 11/11/2016 1811   APPEARANCEUR CLEAR (A) 11/11/2016 1811   LABSPEC 1.003 (L) 11/11/2016 1811   PHURINE 6.0 11/11/2016 1811   GLUCOSEU >=500 (A) 11/11/2016 1811   HGBUR NEGATIVE 11/11/2016 1811   BILIRUBINUR NEGATIVE 11/11/2016 1811   KETONESUR 5 (A) 11/11/2016 1811   PROTEINUR NEGATIVE 11/11/2016 1811   NITRITE NEGATIVE 11/11/2016 1811   LEUKOCYTESUR NEGATIVE 11/11/2016 1811    Lab Results  Component Value Date   BACTERIA NONE SEEN 11/11/2016    Pertinent Imaging: *** No results found for  this or any previous visit.  No results found for this or any previous visit.  No results found for this or any previous visit.  No results found for this or any previous visit.  No results found for this or any previous visit.  No valid procedures specified. No results found for this or any previous visit.  No results found for this or any previous visit.  Assessment & Plan:    There are no diagnoses linked to this encounter.  No follow-ups on file.  Abbie Sons, [place] [address], [place], [place] [address] [phone]

## 2022-07-12 ENCOUNTER — Encounter: Payer: Self-pay | Admitting: Urology

## 2022-08-09 ENCOUNTER — Other Ambulatory Visit: Payer: Medicare PPO

## 2022-08-09 DIAGNOSIS — R972 Elevated prostate specific antigen [PSA]: Secondary | ICD-10-CM

## 2022-08-10 LAB — PSA: Prostate Specific Ag, Serum: 6.3 ng/mL — ABNORMAL HIGH (ref 0.0–4.0)

## 2022-08-13 ENCOUNTER — Telehealth: Payer: Self-pay | Admitting: *Deleted

## 2022-08-13 DIAGNOSIS — R972 Elevated prostate specific antigen [PSA]: Secondary | ICD-10-CM

## 2022-08-13 NOTE — Telephone Encounter (Signed)
-----   Message from Abbie Sons, MD sent at 08/13/2022  7:30 AM EDT ----- PSA level has decreased and was 6.3.  Management options at this point would be to continue to follow the PSA versus obtaining a prostate MRI to assess for any evidence of high-grade prostate cancer.  Please let me know if he has any questions

## 2022-08-15 NOTE — Telephone Encounter (Signed)
Notified patient as instructed, patient would like to do the Prostate mri . I order it

## 2022-08-23 ENCOUNTER — Ambulatory Visit
Admission: RE | Admit: 2022-08-23 | Discharge: 2022-08-23 | Disposition: A | Discharge status: Home / Self Care | Payer: Medicare PPO | Source: Ambulatory Visit | Attending: Urology | Admitting: Urology

## 2022-08-23 DIAGNOSIS — R972 Elevated prostate specific antigen [PSA]: Secondary | ICD-10-CM | POA: Diagnosis present

## 2022-08-23 MED ORDER — GADOBUTROL 1 MMOL/ML IV SOLN
9.0000 mL | Freq: Once | INTRAVENOUS | Status: AC | PRN
  Administered 2022-08-23: 9 mL via INTRAVENOUS

## 2022-08-28 ENCOUNTER — Encounter: Payer: Self-pay | Admitting: Urology

## 2022-09-03 ENCOUNTER — Telehealth: Payer: Self-pay | Admitting: Urology

## 2022-09-03 NOTE — Telephone Encounter (Signed)
Patient dropped by office this morning, and said he has not been contacted to schedule an appointment. He said he thought he was supposed to have an [place] or CT before having a biopsy. He would like someone to contact him regarding this.

## 2022-09-04 NOTE — Telephone Encounter (Signed)
Please see my previous 4/16 MyChart message.  I sent a message to schedule fusion biopsy.  He does not need ultrasound or CT because he had the MRI.  Will also need clearance to stop Eliquis prebiopsy

## 2022-09-05 NOTE — Telephone Encounter (Signed)
Notified patient as instructed, patient under what we need to get clearance first

## 2022-09-11 NOTE — Telephone Encounter (Signed)
Patient called in. Wanting to know update of status of being scheduled for Fusion Biopsy.

## 2022-09-12 ENCOUNTER — Telehealth: Payer: Self-pay

## 2022-09-12 NOTE — Telephone Encounter (Signed)
F/u appt- 10/10/22 at 1015.

## 2022-09-12 NOTE — Telephone Encounter (Signed)
Roberto Leonard 09/12/2022    Provider Requested: [facility] MRI date and location: 08/23/2022 [facility]   Date of fusion bx: 09/26/22 Time: 3:30pm   Blood Thinners- Yes- Eliquis ASA- n/a Nsaids- Stop 2 days prior. Pt aware.  Clearance needed- Yes. Recd- [id number]- See Media   Fleet's Enema OTC 2 hours prior - Pt aware.    Follow up appt: Instructions sent via my chart or mailed. CM

## 2022-09-26 ENCOUNTER — Encounter: Payer: Self-pay | Admitting: Urology

## 2022-09-26 ENCOUNTER — Ambulatory Visit: Payer: Medicare PPO | Admitting: Urology

## 2022-09-26 VITALS — BP 166/84 | HR 56

## 2022-09-26 DIAGNOSIS — Z2989 Encounter for other specified prophylactic measures: Secondary | ICD-10-CM

## 2022-09-26 DIAGNOSIS — R972 Elevated prostate specific antigen [PSA]: Secondary | ICD-10-CM | POA: Diagnosis not present

## 2022-09-26 DIAGNOSIS — C61 Malignant neoplasm of prostate: Secondary | ICD-10-CM

## 2022-09-26 MED ORDER — LEVOFLOXACIN 500 MG PO TABS
500.0000 mg | ORAL_TABLET | Freq: Once | ORAL | Status: AC
  Administered 2022-09-26: 500 mg via ORAL

## 2022-09-26 MED ORDER — GENTAMICIN SULFATE 40 MG/ML IJ SOLN
80.0000 mg | Freq: Once | INTRAMUSCULAR | Status: AC
  Administered 2022-09-26: 80 mg via INTRAMUSCULAR

## 2022-09-26 NOTE — Patient Instructions (Addendum)

## 2022-09-26 NOTE — Progress Notes (Signed)
   09/26/22  Indication: Elevated PSA 6.3, prostate MRI showing 68 g prostate with 3 different abnormal lesions   MRI Fusion Prostate Biopsy Procedure   Informed consent was obtained, and we discussed the risks of bleeding and infection/sepsis. A time out was performed to ensure correct patient identity.  Pre-Procedure: - Last PSA Level: 6.3 - Gentamicin and levaquin given for antibiotic prophylaxis - Prostate measured 68 g on MRI, PSA density 0.09 - Small median lobe  Procedure: - Prostate block performed using 10 cc 1% lidocaine  - MRI fusion biopsy was performed, and biopsies were taken from the ROIs: #1: PI-RADS 5 lesion right peripheral zone(2) #2: PI-RADS 4 lesion left peripheral zone(2) #3: PI-RADS 3 lesion left peripheral zone(2)  - Standard biopsies taken from sextant areas, 12 under ultrasound guidance. - Total of 18 cores taken  Post-Procedure: - Patient tolerated the procedure well - He was counseled to seek immediate medical attention if experiences significant bleeding, fevers, or severe pain - Return in one week to discuss biopsy results  Assessment/ Plan: Will follow up in 1-2 weeks to discuss pathology with Dr. Mort Sawyers, MD 09/26/2022

## 2022-10-10 ENCOUNTER — Ambulatory Visit: Payer: Medicare PPO | Admitting: Urology

## 2022-10-12 ENCOUNTER — Ambulatory Visit: Payer: Medicare PPO | Admitting: Urology

## 2022-10-12 ENCOUNTER — Encounter: Payer: Self-pay | Admitting: Urology

## 2022-10-12 VITALS — BP 122/77 | HR 67 | Ht 68.0 in | Wt 215.0 lb

## 2022-10-12 DIAGNOSIS — C61 Malignant neoplasm of prostate: Secondary | ICD-10-CM | POA: Diagnosis not present

## 2022-10-12 NOTE — Progress Notes (Signed)
I, Roberto Leonard,acting as a scribe for Roberto Altes, MD.,have documented all relevant documentation on the behalf of Roberto Altes, MD,as directed by  Roberto Altes, MD while in the presence of Roberto Altes, MD.  10/12/2022 1:56 PM   Holland Falling 07-12-1940 [id number]  Referring provider: Bosie Clos, MD [address] [place],  [place] [address]  Chief Complaint  Patient presents with   Results   Urologic history:  1. Elevated PSA PSA 08/09/2022 was 6.3  HPI: 82 y.o. male follow-up visit for prostate biopsy results.   PSA 08/09/22 6.3 with prostate MRI showing PI-RADS 5 lesion right PZ, PI-RADS 4 lesion left PZ, and PI-RADS 3 lesion left PZ.  Standard 12-core biopsies were taken in addition to 2 cores of each ROI. Prostate volume was 68 grams.  The PI-RADS 5 lesion showed Gleason 3+3 adenocarcinoma involving 5% of the total biopsy length. 6/6 left-sided template biopsies were benign.  Right lateral mid-core shown Gleason 4+3 adenocarcinoma involving 27%; right apical biopsy Gleason 3+4 adenocarcinoma involving 62%; and the right lateral base Gleason 3+3 adenocarcinoma involving 37%. The PI-RADS 4 lesion showed a typical small acinar proliferation.  PSA trend  Prostate Specific Ag, Serum  Latest Ref Rng 0.0 - 4.0 ng/mL  09/16/2018 4.7 (H)   03/17/2019 4.8 (H)   07/15/2020 6.6 (H)   01/19/2021 5.1 (H)   05/30/2021 5.7 (H)   10/26/2021 5.6 (H)   08/09/2022 6.3 (H)      PMH: Past Medical History:  Diagnosis Date   Allergy    Detached retina    RIGHT   Diabetes mellitus without complication (HCC)    ORAL MED   Dupuytren's contracture    ED (erectile dysfunction)    GERD (gastroesophageal reflux disease)    Glaucoma    Hyperlipidemia    Hypertension     Surgical History: Past Surgical History:  Procedure Laterality Date   CATARACT EXTRACTION     right   COLONOSCOPY WITH PROPOFOL N/A 01/03/2015   Procedure: COLONOSCOPY WITH PROPOFOL;  Surgeon:  Midge Minium, MD;  Location: [facility];  Service: Endoscopy;  Laterality: N/A;  DIABETIC-ORAL MEDS   COLONOSCOPY WITH PROPOFOL N/A 10/20/2019   Procedure: COLONOSCOPY WITH PROPOFOL;  Surgeon: Midge Minium, MD;  Location: [facility] ENDOSCOPY;  Service: Endoscopy;  Laterality: N/A;   EYE SURGERY     HERNIA REPAIR     LOOP RECORDER INSERTION N/A 11/15/2016   Procedure: Loop Recorder Insertion;  Surgeon: Duke Salvia, MD;  Location: [facility] INVASIVE CV LAB;  Service: Cardiovascular;  Laterality: N/A;   POLYPECTOMY N/A 01/03/2015   Procedure: POLYPECTOMY;  Surgeon: Midge Minium, MD;  Location: [facility];  Service: Endoscopy;  Laterality: N/A;  Sigmoid polyp   RETINAL DETACHMENT SURGERY     right eye, wrong size lense, secondary surgery to replace it    Home Medications:  Allergies as of 10/12/2022       Reactions   Sulfa Antibiotics Other (See Comments)   SWEATS, bad headache SWEATS, bad headache        Medication List        Accurate as of Oct 12, 2022  1:56 PM. If you have any questions, ask your nurse or doctor.          brimonidine 0.2 % ophthalmic solution Commonly known as: ALPHAGAN Place 1 drop into both eyes 2 (two) times daily.   Cetirizine HCl 10 MG Caps Take 10 mg by mouth  daily.   cholecalciferol 1000 units tablet Commonly known as: VITAMIN D Take 1,000 Units by mouth daily.   diltiazem 180 MG 24 hr capsule Commonly known as: CARDIZEM CD Take 180 mg by mouth daily.   dorzolamide-timolol 2-0.5 % ophthalmic solution Commonly known as: COSOPT INSTILL 1 DROP INTO BOTH EYES TWICE A DAY   Eliquis 5 MG Tabs tablet Generic drug: apixaban TAKE 1 TABLET BY MOUTH TWICE A DAY   Fish Oil Burp-Less 1200 MG Caps Take 2 capsules by mouth every evening.   glucose blood test strip Use with meter to test daily and as needfed   losartan 100 MG tablet Commonly known as: COZAAR TAKE 1 TABLET (100 MG TOTAL) BY MOUTH DAILY IN THE MORNING (NEED OFFICE VISIT)    multivitamin with minerals Tabs tablet Take 1 tablet by mouth daily.   omeprazole 40 MG capsule Commonly known as: PRILOSEC TAKE 1 CAPSULE BY MOUTH EVERY DAY   pioglitazone 30 MG tablet Commonly known as: ACTOS TAKE 1 TABLET BY MOUTH EVERY DAY IN THE MORNING   simvastatin 20 MG tablet Commonly known as: ZOCOR TAKE 1 TABLET BY MOUTH EVERYDAY AT BEDTIME        Allergies:  Allergies  Allergen Reactions   Sulfa Antibiotics Other (See Comments)    SWEATS, bad headache SWEATS, bad headache    Family History: Family History  Problem Relation Age of Onset   Heart disease Mother        Died from CHF   Diabetes Mother    Diabetes Father    Hypertension Father    Heart disease Father        plaque build up   Diabetes Brother    Diabetes Daughter        type 1    Social History:  reports that he quit smoking about 59 years ago. His smoking use included cigarettes. He has a 1.00 pack-year smoking history. He has been exposed to tobacco smoke. He has never used smokeless tobacco. He reports that he does not drink alcohol and does not use drugs.   Physical Exam: BP 122/77   Pulse 67   Ht 5\' 8"  (1.727 m)   Wt 215 lb (97.5 kg)   BMI 32.69 kg/m   Constitutional:  Alert and oriented, No acute distress. HEENT: [place] AT Respiratory: Normal respiratory effort, no increased work of breathing. Psychiatric: Normal mood and affect.   Assessment & Plan:    1. T1c prostate cancer Risk stratification: intermediate unfavorable. Based on his life expectancy, we discussed management options for intermediate unfavorable risk prostate cancer, including radiation modalities of EBRT+ADT or EBRT+ brachytherapy +/- ADT and observation. He is active and healthy and has elected to pursue radiation therapy- radiation oncology referral placed.  Based on unfavorable intermediate risk disease, further imaging for staging is recommended. He has requested a PSMA/PET   I have reviewed the above  documentation for accuracy and completeness, and I agree with the above.   I spent 30 total minutes on the day of the encounter including pre-visit review of the medical record, face-to-face time with the patient, and post visit ordering of labs/imaging/tests.   Roberto Altes, MD  [facility] [address] [place], [place] [address] [phone]

## 2022-10-17 ENCOUNTER — Telehealth: Payer: Self-pay | Admitting: *Deleted

## 2022-10-17 DIAGNOSIS — C61 Malignant neoplasm of prostate: Secondary | ICD-10-CM

## 2022-10-17 NOTE — Telephone Encounter (Signed)
Dr. Rushie Chestnut , advised patient

## 2022-10-17 NOTE — Addendum Note (Signed)
Addended by: Riki Altes on: 10/17/2022 12:55 PM   Modules accepted: Orders

## 2022-10-17 NOTE — Telephone Encounter (Signed)
Patient called and ask who is radiation oncology he will be seeing.

## 2022-10-24 ENCOUNTER — Ambulatory Visit
Admission: RE | Admit: 2022-10-24 | Discharge: 2022-10-24 | Disposition: A | Discharge status: Home / Self Care | Payer: Medicare PPO | Source: Ambulatory Visit | Attending: Radiation Oncology | Admitting: Radiation Oncology

## 2022-10-24 ENCOUNTER — Encounter: Payer: Self-pay | Admitting: Radiation Oncology

## 2022-10-24 VITALS — BP 132/74 | HR 61 | Temp 98.5°F | Resp 16 | Ht 74.0 in | Wt 213.0 lb

## 2022-10-24 DIAGNOSIS — M720 Palmar fascial fibromatosis [Dupuytren]: Secondary | ICD-10-CM | POA: Diagnosis not present

## 2022-10-24 DIAGNOSIS — Z87891 Personal history of nicotine dependence: Secondary | ICD-10-CM | POA: Diagnosis not present

## 2022-10-24 DIAGNOSIS — E785 Hyperlipidemia, unspecified: Secondary | ICD-10-CM | POA: Insufficient documentation

## 2022-10-24 DIAGNOSIS — C61 Malignant neoplasm of prostate: Secondary | ICD-10-CM

## 2022-10-24 DIAGNOSIS — E119 Type 2 diabetes mellitus without complications: Secondary | ICD-10-CM | POA: Diagnosis not present

## 2022-10-24 DIAGNOSIS — R351 Nocturia: Secondary | ICD-10-CM | POA: Insufficient documentation

## 2022-10-24 DIAGNOSIS — I10 Essential (primary) hypertension: Secondary | ICD-10-CM | POA: Insufficient documentation

## 2022-10-24 DIAGNOSIS — Z7901 Long term (current) use of anticoagulants: Secondary | ICD-10-CM | POA: Diagnosis not present

## 2022-10-24 DIAGNOSIS — K219 Gastro-esophageal reflux disease without esophagitis: Secondary | ICD-10-CM | POA: Insufficient documentation

## 2022-10-24 DIAGNOSIS — Z7984 Long term (current) use of oral hypoglycemic drugs: Secondary | ICD-10-CM | POA: Diagnosis not present

## 2022-10-24 DIAGNOSIS — Z79899 Other long term (current) drug therapy: Secondary | ICD-10-CM | POA: Insufficient documentation

## 2022-10-24 DIAGNOSIS — N529 Male erectile dysfunction, unspecified: Secondary | ICD-10-CM | POA: Insufficient documentation

## 2022-10-24 NOTE — Consult Note (Signed)
NEW PATIENT EVALUATION  Name: Roberto Leonard  MRN: [id number]  Date:   10/24/2022     DOB: 28-Jun-1940   This 82 y.o. 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  6.3  REFERRING PHYSICIAN: Bosie Clos, MD  CHIEF COMPLAINT:  Chief Complaint  Patient presents with   Prostate Cancer    DIAGNOSIS: The encounter diagnosis was Prostate cancer (HCC).   PREVIOUS INVESTIGATIONS:  MRI scan reviewed PSMA PET scan has been ordered Clinical notes reviewed Pathology report reviewed  HPI: Patient is a 82 year old male whose had a elevated PSA for the past 4 years initially was 4.7 most recently 6.3.  He underwent prostate biopsy back in May showing 4 out of 15 cores positive for adenocarcinoma a mixture of Gleason 7 (3+4) as well as (4+3) as well as Gleason 6 adenocarcinoma.  MRI scan of his prostate showed a PI-RADS category 5 lesion in the right peripheral zone as well as a PI-RADS category 4 lesion in the left peripheral zone.  Although there was no extracapsular extension extension there was 1.5 cm of capsular contact.  No evidence of pelvic adenopathy his prostate volume was 68 cc.  Patient has a PSMA PET scan ordered for next week.  He is fairly asymptomatic does have nocturia x 3.  No bone pain no problems with his bowels.  He is seen today for radiation oncology opinion.  PLANNED TREATMENT REGIMEN: Image guided IMRT radiation therapy plus ADT therapy  PAST MEDICAL HISTORY:  has a past medical history of Allergy, Detached retina, Diabetes mellitus without complication (HCC), Dupuytren's contracture, ED (erectile dysfunction), GERD (gastroesophageal reflux disease), Glaucoma, Hyperlipidemia, and Hypertension.    PAST SURGICAL HISTORY:  Past Surgical History:  Procedure Laterality Date   CATARACT EXTRACTION     right   COLONOSCOPY WITH PROPOFOL N/A 01/03/2015   Procedure:  COLONOSCOPY WITH PROPOFOL;  Surgeon: Midge Minium, MD;  Location: [facility];  Service: Endoscopy;  Laterality: N/A;  DIABETIC-ORAL MEDS   COLONOSCOPY WITH PROPOFOL N/A 10/20/2019   Procedure: COLONOSCOPY WITH PROPOFOL;  Surgeon: Midge Minium, MD;  Location: [facility] ENDOSCOPY;  Service: Endoscopy;  Laterality: N/A;   EYE SURGERY     HERNIA REPAIR     LOOP RECORDER INSERTION N/A 11/15/2016   Procedure: Loop Recorder Insertion;  Surgeon: Duke Salvia, MD;  Location: [facility] INVASIVE CV LAB;  Service: Cardiovascular;  Laterality: N/A;   POLYPECTOMY N/A 01/03/2015   Procedure: POLYPECTOMY;  Surgeon: Midge Minium, MD;  Location: [facility];  Service: Endoscopy;  Laterality: N/A;  Sigmoid polyp   RETINAL DETACHMENT SURGERY     right eye, wrong size lense, secondary surgery to replace it    FAMILY HISTORY: family history includes Diabetes in his brother, daughter, father, and mother; Heart disease in his father and mother; Hypertension in his father.  SOCIAL HISTORY:  reports that he quit smoking about 59 years ago. His smoking use included cigarettes. He has a 1.00 pack-year smoking history. He has been exposed to tobacco smoke. He has never used smokeless tobacco. He reports that he does not drink alcohol and does not use drugs.  ALLERGIES: Sulfa antibiotics  MEDICATIONS:  Current Outpatient Medications  Medication Sig Dispense Refill   brimonidine (ALPHAGAN) 0.2 % ophthalmic solution Place 1 drop into both eyes 2 (two) times daily.  3   Cetirizine HCl 10 MG CAPS Take 10 mg by mouth daily.  cholecalciferol (VITAMIN D) 1000 units tablet Take 1,000 Units by mouth daily.     diltiazem (CARDIZEM CD) 180 MG 24 hr capsule Take 180 mg by mouth daily.      dorzolamide-timolol (COSOPT) 22.3-6.8 MG/ML ophthalmic solution INSTILL 1 DROP INTO BOTH EYES TWICE A DAY 20 mL 2   ELIQUIS 5 MG TABS tablet TAKE 1 TABLET BY MOUTH TWICE A DAY 180 tablet 2   glucose blood test strip Use with meter to test  daily and as needfed 100 each 12   losartan (COZAAR) 100 MG tablet TAKE 1 TABLET (100 MG TOTAL) BY MOUTH DAILY IN THE MORNING (NEED OFFICE VISIT) 90 tablet 3   Multiple Vitamin (MULTIVITAMIN WITH MINERALS) TABS tablet Take 1 tablet by mouth daily.     Omega-3 Fatty Acids (FISH OIL BURP-LESS) 1200 MG CAPS Take 2 capsules by mouth every evening.      omeprazole (PRILOSEC) 40 MG capsule TAKE 1 CAPSULE BY MOUTH EVERY DAY 90 capsule 1   pioglitazone (ACTOS) 30 MG tablet TAKE 1 TABLET BY MOUTH EVERY DAY IN THE MORNING 90 tablet 3   simvastatin (ZOCOR) 20 MG tablet TAKE 1 TABLET BY MOUTH EVERYDAY AT BEDTIME 90 tablet 3   No current facility-administered medications for this encounter.    ECOG PERFORMANCE STATUS:  0 - Asymptomatic  REVIEW OF SYSTEMS: Patient denies any weight loss, fatigue, weakness, fever, chills or night sweats. Patient denies any loss of vision, blurred vision. Patient denies any ringing  of the ears or hearing loss. No irregular heartbeat. Patient denies heart murmur or history of fainting. Patient denies any chest pain or pain radiating to her upper extremities. Patient denies any shortness of breath, difficulty breathing at night, cough or hemoptysis. Patient denies any swelling in the lower legs. Patient denies any nausea vomiting, vomiting of blood, or coffee ground material in the vomitus. Patient denies any stomach pain. Patient states has had normal bowel movements no significant constipation or diarrhea. Patient denies any dysuria, hematuria or significant nocturia. Patient denies any problems walking, swelling in the joints or loss of balance. Patient denies any skin changes, loss of hair or loss of weight. Patient denies any excessive worrying or anxiety or significant depression. Patient denies any problems with insomnia. Patient denies excessive thirst, polyuria, polydipsia. Patient denies any swollen glands, patient denies easy bruising or easy bleeding. Patient denies any  recent infections, allergies or URI. Patient "s visual fields have not changed significantly in recent time.   PHYSICAL EXAM: BP 132/74   Pulse 61   Temp 98.5 F (36.9 C)   Resp 16   Ht 6\' 2"  (1.88 m)   Wt 213 lb (96.6 kg)   BMI 27.35 kg/m  Well-developed well-nourished patient in NAD. HEENT reveals PERLA, EOMI, discs not visualized.  Oral cavity is clear. No oral mucosal lesions are identified. Neck is clear without evidence of cervical or supraclavicular adenopathy. Lungs are clear to A&P. Cardiac examination is essentially unremarkable with regular rate and rhythm without murmur rub or thrill. Abdomen is benign with no organomegaly or masses noted. Motor sensory and DTR levels are equal and symmetric in the upper and lower extremities. Cranial nerves II through XII are grossly intact. Proprioception is intact. No peripheral adenopathy or edema is identified. No motor or sensory levels are noted. Crude visual fields are within normal range.  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  Based on his age and prostate volume not a candidate for interstitial implant.  Will plan on delivering 80 Gray over 8 weeks to his prostate.  Will review his PSMA PET scan before simulation and should anything change on that study will alter our treatment plan.  Risks and benefits of treatment including increased lower urinary tract symptoms diarrhea fatigue alteration blood counts all were reviewed in detail with the patient and his wife.  I have asked Dr. Lonna Cobb to place fiducial markers in his prostate for daily image guided treatment.  Of also asked him to initiate Eligard therapy with a 50-month depot.   Patient comprehends my recommendations well.  I would like to take this opportunity to thank you for allowing me to participate in the care of your patient.Carmina Miller, MD

## 2022-10-30 ENCOUNTER — Ambulatory Visit
Admission: RE | Admit: 2022-10-30 | Discharge: 2022-10-30 | Disposition: A | Discharge status: Home / Self Care | Payer: Medicare PPO | Source: Ambulatory Visit | Attending: Urology | Admitting: Urology

## 2022-10-30 ENCOUNTER — Institutional Professional Consult (permissible substitution): Payer: Medicare PPO | Admitting: Radiation Oncology

## 2022-10-30 DIAGNOSIS — I700 Atherosclerosis of aorta: Secondary | ICD-10-CM | POA: Insufficient documentation

## 2022-10-30 DIAGNOSIS — C61 Malignant neoplasm of prostate: Secondary | ICD-10-CM | POA: Insufficient documentation

## 2022-10-30 DIAGNOSIS — I2510 Atherosclerotic heart disease of native coronary artery without angina pectoris: Secondary | ICD-10-CM | POA: Insufficient documentation

## 2022-10-30 DIAGNOSIS — K760 Fatty (change of) liver, not elsewhere classified: Secondary | ICD-10-CM | POA: Insufficient documentation

## 2022-10-30 MED ORDER — PIFLIFOLASTAT F 18 (PYLARIFY) INJECTION
9.0000 | Freq: Once | INTRAVENOUS | Status: AC
  Administered 2022-10-30: 9.8 via INTRAVENOUS

## 2022-10-31 ENCOUNTER — Encounter: Payer: Medicare PPO | Admitting: Family Medicine

## 2022-11-08 ENCOUNTER — Ambulatory Visit: Payer: Medicare PPO | Admitting: Urology

## 2022-11-08 VITALS — BP 138/83 | HR 57 | Ht 74.0 in | Wt 213.0 lb

## 2022-11-08 DIAGNOSIS — Z2989 Encounter for other specified prophylactic measures: Secondary | ICD-10-CM | POA: Diagnosis not present

## 2022-11-08 DIAGNOSIS — C61 Malignant neoplasm of prostate: Secondary | ICD-10-CM

## 2022-11-08 MED ORDER — GENTAMICIN SULFATE 40 MG/ML IJ SOLN
80.0000 mg | Freq: Once | INTRAMUSCULAR | Status: AC
Start: 2022-11-08 — End: 2022-11-08
  Administered 2022-11-08: 80 mg via INTRAMUSCULAR

## 2022-11-08 MED ORDER — LEVOFLOXACIN 500 MG PO TABS
500.0000 mg | ORAL_TABLET | Freq: Once | ORAL | Status: AC
Start: 2022-11-08 — End: 2022-11-08
  Administered 2022-11-08: 500 mg via ORAL

## 2022-11-08 MED ORDER — LEUPROLIDE ACETATE (6 MONTH) 45 MG ~~LOC~~ KIT
45.0000 mg | PACK | Freq: Once | SUBCUTANEOUS | Status: AC
Start: 2022-11-08 — End: 2022-11-08
  Administered 2022-11-08: 45 mg via SUBCUTANEOUS

## 2022-11-08 NOTE — Progress Notes (Signed)
Eligard SubQ Injection   Due to Prostate Cancer patient is present today for a Eligard Injection.  Medication: Eligard 6 month Dose: 45 mg  Location: [facility]  Lot: [id number] Exp: 01/2024  Patient tolerated well, no complications were noted  Performed by: Fionn Stracke RMA  Per Dr. Lonna Cobb patient is to get one dose of Eligard.  PA approval dates:  NO PA reqiured

## 2022-11-08 NOTE — Progress Notes (Signed)
11/08/22  CC: gold fiducial marker placement  HPI: 82 y.o. male with intermediate unfavorable risk prostate cancer who presents today for placement of fiducial seed markers in anticipation of his upcoming IMRT with Dr. Rushie Chestnut.  Prostate Gold fiducial Marker Placement Procedure   Informed consent was obtained after discussing risks/benefits of the procedure.  A time out was performed to ensure correct patient identity.  Pre-Procedure: - Gentamicin given prophylactically - PO Levaquin 500 mg also given today  Procedure: - Lidocaine jelly was administered per rectum - Rectal ultrasound probe was placed without difficulty and the prostate visualized - Prostatic block performed with 10 mL 1% Xylocaine - 3 fiducial gold seed markers placed, one at right base, one at left base, one at apex of prostate gland under transrectal ultrasound guidance  Post-Procedure: - Patient tolerated the procedure well - He was counseled to seek immediate medical attention if experiences any severe pain, significant bleeding, or fevers - 6 month leuprolide recommended by radiation oncology.  I discussed the side effects including hot flashes, tiredness, fatigue, decreased libido and ED - He also had questions regarding the incidental finding of coronary artery calcifications on his PET/CT.  He does see Dr. Darrold Junker and recommend he contact their office to see if he needs any further evaluation    Irineo Axon, MD

## 2022-11-20 ENCOUNTER — Ambulatory Visit
Admission: RE | Admit: 2022-11-20 | Discharge: 2022-11-20 | Disposition: A | Discharge status: Home / Self Care | Payer: Medicare PPO | Source: Ambulatory Visit | Attending: Radiation Oncology | Admitting: Radiation Oncology

## 2022-11-20 DIAGNOSIS — N529 Male erectile dysfunction, unspecified: Secondary | ICD-10-CM | POA: Insufficient documentation

## 2022-11-20 DIAGNOSIS — Z7984 Long term (current) use of oral hypoglycemic drugs: Secondary | ICD-10-CM | POA: Diagnosis not present

## 2022-11-20 DIAGNOSIS — K219 Gastro-esophageal reflux disease without esophagitis: Secondary | ICD-10-CM | POA: Diagnosis not present

## 2022-11-20 DIAGNOSIS — Z79899 Other long term (current) drug therapy: Secondary | ICD-10-CM | POA: Insufficient documentation

## 2022-11-20 DIAGNOSIS — Z87891 Personal history of nicotine dependence: Secondary | ICD-10-CM | POA: Diagnosis not present

## 2022-11-20 DIAGNOSIS — C61 Malignant neoplasm of prostate: Secondary | ICD-10-CM | POA: Diagnosis not present

## 2022-11-20 DIAGNOSIS — E785 Hyperlipidemia, unspecified: Secondary | ICD-10-CM | POA: Insufficient documentation

## 2022-11-20 DIAGNOSIS — E119 Type 2 diabetes mellitus without complications: Secondary | ICD-10-CM | POA: Insufficient documentation

## 2022-11-20 DIAGNOSIS — R351 Nocturia: Secondary | ICD-10-CM | POA: Insufficient documentation

## 2022-11-20 DIAGNOSIS — I10 Essential (primary) hypertension: Secondary | ICD-10-CM | POA: Diagnosis not present

## 2022-11-20 DIAGNOSIS — Z7901 Long term (current) use of anticoagulants: Secondary | ICD-10-CM | POA: Insufficient documentation

## 2022-11-20 DIAGNOSIS — M720 Palmar fascial fibromatosis [Dupuytren]: Secondary | ICD-10-CM | POA: Diagnosis not present

## 2022-11-23 ENCOUNTER — Other Ambulatory Visit: Payer: Self-pay | Admitting: *Deleted

## 2022-11-23 DIAGNOSIS — C61 Malignant neoplasm of prostate: Secondary | ICD-10-CM

## 2022-11-24 DIAGNOSIS — C61 Malignant neoplasm of prostate: Secondary | ICD-10-CM | POA: Diagnosis not present

## 2022-11-28 ENCOUNTER — Ambulatory Visit: Admission: RE | Admit: 2022-11-28 | Payer: Medicare PPO | Source: Ambulatory Visit

## 2022-11-28 ENCOUNTER — Other Ambulatory Visit: Payer: Self-pay | Admitting: *Deleted

## 2022-11-28 MED ORDER — TAMSULOSIN HCL 0.4 MG PO CAPS: 0.4000 mg | ORAL_CAPSULE | Freq: Every day | ORAL | 3 refills | Status: DC

## 2022-11-28 MED ORDER — CEFDINIR 300 MG PO CAPS: 300.0000 mg | ORAL_CAPSULE | Freq: Two times a day (BID) | ORAL | 0 refills | Status: DC

## 2022-11-29 ENCOUNTER — Other Ambulatory Visit: Payer: Self-pay

## 2022-11-29 ENCOUNTER — Ambulatory Visit
Admission: RE | Admit: 2022-11-29 | Discharge: 2022-11-29 | Disposition: A | Discharge status: Home / Self Care | Payer: Medicare PPO | Source: Ambulatory Visit | Attending: Radiation Oncology | Admitting: Radiation Oncology

## 2022-11-29 DIAGNOSIS — C61 Malignant neoplasm of prostate: Secondary | ICD-10-CM | POA: Diagnosis not present

## 2022-11-29 LAB — RAD ONC ARIA SESSION SUMMARY
Course Elapsed Days: 0
Plan Fractions Treated to Date: 1
Plan Prescribed Dose Per Fraction: 2 Gy
Plan Total Fractions Prescribed: 40
Plan Total Prescribed Dose: 80 Gy
Reference Point Dosage Given to Date: 2 Gy
Reference Point Session Dosage Given: 2 Gy
Session Number: 1

## 2022-11-30 ENCOUNTER — Ambulatory Visit: Payer: Medicare PPO

## 2022-12-03 ENCOUNTER — Other Ambulatory Visit: Payer: Self-pay

## 2022-12-03 ENCOUNTER — Ambulatory Visit
Admission: RE | Admit: 2022-12-03 | Discharge: 2022-12-03 | Disposition: A | Discharge status: Home / Self Care | Payer: Medicare PPO | Source: Ambulatory Visit | Attending: Radiation Oncology | Admitting: Radiation Oncology

## 2022-12-03 DIAGNOSIS — C61 Malignant neoplasm of prostate: Secondary | ICD-10-CM | POA: Diagnosis not present

## 2022-12-03 LAB — RAD ONC ARIA SESSION SUMMARY
Course Elapsed Days: 4
Plan Fractions Treated to Date: 2
Plan Prescribed Dose Per Fraction: 2 Gy
Plan Total Fractions Prescribed: 40
Plan Total Prescribed Dose: 80 Gy
Reference Point Dosage Given to Date: 4 Gy
Reference Point Session Dosage Given: 2 Gy
Session Number: 2

## 2022-12-04 ENCOUNTER — Ambulatory Visit: Admission: RE | Admit: 2022-12-04 | Payer: Medicare PPO | Source: Ambulatory Visit

## 2022-12-04 ENCOUNTER — Other Ambulatory Visit: Payer: Self-pay

## 2022-12-04 DIAGNOSIS — C61 Malignant neoplasm of prostate: Secondary | ICD-10-CM | POA: Diagnosis not present

## 2022-12-04 LAB — RAD ONC ARIA SESSION SUMMARY
Course Elapsed Days: 5
Plan Fractions Treated to Date: 3
Plan Prescribed Dose Per Fraction: 2 Gy
Plan Total Fractions Prescribed: 40
Plan Total Prescribed Dose: 80 Gy
Reference Point Dosage Given to Date: 6 Gy
Reference Point Session Dosage Given: 2 Gy
Session Number: 3

## 2022-12-05 ENCOUNTER — Other Ambulatory Visit: Payer: Self-pay

## 2022-12-05 ENCOUNTER — Inpatient Hospital Stay: Payer: Medicare PPO

## 2022-12-05 ENCOUNTER — Ambulatory Visit: Admission: RE | Admit: 2022-12-05 | Payer: Medicare PPO | Source: Ambulatory Visit

## 2022-12-05 DIAGNOSIS — C61 Malignant neoplasm of prostate: Secondary | ICD-10-CM | POA: Insufficient documentation

## 2022-12-05 LAB — RAD ONC ARIA SESSION SUMMARY
Course Elapsed Days: 6
Plan Fractions Treated to Date: 4
Plan Prescribed Dose Per Fraction: 2 Gy
Plan Total Fractions Prescribed: 40
Plan Total Prescribed Dose: 80 Gy
Reference Point Dosage Given to Date: 8 Gy
Reference Point Session Dosage Given: 2 Gy
Session Number: 4

## 2022-12-05 LAB — CBC (CANCER CENTER ONLY)
HCT: 48 % (ref 39.0–52.0)
Hemoglobin: 15.7 g/dL (ref 13.0–17.0)
MCH: 29 pg (ref 26.0–34.0)
MCHC: 32.7 g/dL (ref 30.0–36.0)
MCV: 88.6 fL (ref 80.0–100.0)
Platelet Count: 158 10*3/uL (ref 150–400)
RBC: 5.42 MIL/uL (ref 4.22–5.81)
RDW: 13 % (ref 11.5–15.5)
WBC Count: 8.6 10*3/uL (ref 4.0–10.5)
nRBC: 0 % (ref 0.0–0.2)

## 2022-12-06 ENCOUNTER — Other Ambulatory Visit: Payer: Self-pay

## 2022-12-06 ENCOUNTER — Ambulatory Visit
Admission: RE | Admit: 2022-12-06 | Discharge: 2022-12-06 | Disposition: A | Discharge status: Home / Self Care | Payer: Medicare PPO | Source: Ambulatory Visit | Attending: Radiation Oncology | Admitting: Radiation Oncology

## 2022-12-06 DIAGNOSIS — C61 Malignant neoplasm of prostate: Secondary | ICD-10-CM | POA: Diagnosis not present

## 2022-12-06 LAB — RAD ONC ARIA SESSION SUMMARY
Course Elapsed Days: 7
Plan Fractions Treated to Date: 5
Plan Prescribed Dose Per Fraction: 2 Gy
Plan Total Fractions Prescribed: 40
Plan Total Prescribed Dose: 80 Gy
Reference Point Dosage Given to Date: 10 Gy
Reference Point Session Dosage Given: 2 Gy
Session Number: 5

## 2022-12-07 ENCOUNTER — Other Ambulatory Visit: Payer: Self-pay

## 2022-12-07 ENCOUNTER — Ambulatory Visit: Admission: RE | Admit: 2022-12-07 | Payer: Medicare PPO | Source: Ambulatory Visit

## 2022-12-07 DIAGNOSIS — C61 Malignant neoplasm of prostate: Secondary | ICD-10-CM | POA: Diagnosis not present

## 2022-12-07 LAB — RAD ONC ARIA SESSION SUMMARY
Course Elapsed Days: 8
Plan Fractions Treated to Date: 6
Plan Prescribed Dose Per Fraction: 2 Gy
Plan Total Fractions Prescribed: 40
Plan Total Prescribed Dose: 80 Gy
Reference Point Dosage Given to Date: 12 Gy
Reference Point Session Dosage Given: 2 Gy
Session Number: 6

## 2022-12-10 ENCOUNTER — Other Ambulatory Visit: Payer: Self-pay

## 2022-12-10 ENCOUNTER — Ambulatory Visit
Admission: RE | Admit: 2022-12-10 | Discharge: 2022-12-10 | Disposition: A | Discharge status: Home / Self Care | Payer: Medicare PPO | Source: Ambulatory Visit | Attending: Radiation Oncology | Admitting: Radiation Oncology

## 2022-12-10 DIAGNOSIS — C61 Malignant neoplasm of prostate: Secondary | ICD-10-CM | POA: Diagnosis not present

## 2022-12-10 LAB — RAD ONC ARIA SESSION SUMMARY
Course Elapsed Days: 11
Plan Fractions Treated to Date: 7
Plan Prescribed Dose Per Fraction: 2 Gy
Plan Total Fractions Prescribed: 40
Plan Total Prescribed Dose: 80 Gy
Reference Point Dosage Given to Date: 14 Gy
Reference Point Session Dosage Given: 2 Gy
Session Number: 7

## 2022-12-11 ENCOUNTER — Telehealth: Payer: Self-pay

## 2022-12-11 ENCOUNTER — Ambulatory Visit
Admission: RE | Admit: 2022-12-11 | Discharge: 2022-12-11 | Disposition: A | Discharge status: Home / Self Care | Payer: Medicare PPO | Source: Ambulatory Visit | Attending: Radiation Oncology | Admitting: Radiation Oncology

## 2022-12-11 ENCOUNTER — Other Ambulatory Visit: Payer: Self-pay | Admitting: *Deleted

## 2022-12-11 ENCOUNTER — Other Ambulatory Visit: Payer: Self-pay

## 2022-12-11 DIAGNOSIS — C61 Malignant neoplasm of prostate: Secondary | ICD-10-CM | POA: Diagnosis not present

## 2022-12-11 LAB — RAD ONC ARIA SESSION SUMMARY
Course Elapsed Days: 12
Plan Fractions Treated to Date: 8
Plan Prescribed Dose Per Fraction: 2 Gy
Plan Total Fractions Prescribed: 40
Plan Total Prescribed Dose: 80 Gy
Reference Point Dosage Given to Date: 16 Gy
Reference Point Session Dosage Given: 2 Gy
Session Number: 8

## 2022-12-11 MED ORDER — MIRABEGRON ER 50 MG PO TB24: 50.0000 mg | ORAL_TABLET | Freq: Every day | ORAL | 1 refills | Status: DC

## 2022-12-11 NOTE — Telephone Encounter (Signed)
Patient called stating that his urine was bright orange after taking recommended Azo tablets. I advised patient that this is a normal side effect but would let Dr Rushie Chestnut know. He was scared that he was peeing blood.

## 2022-12-12 ENCOUNTER — Ambulatory Visit
Admission: RE | Admit: 2022-12-12 | Discharge: 2022-12-12 | Disposition: A | Discharge status: Home / Self Care | Payer: Medicare PPO | Source: Ambulatory Visit | Attending: Radiation Oncology | Admitting: Radiation Oncology

## 2022-12-12 ENCOUNTER — Other Ambulatory Visit: Payer: Self-pay

## 2022-12-12 DIAGNOSIS — C61 Malignant neoplasm of prostate: Secondary | ICD-10-CM | POA: Diagnosis not present

## 2022-12-12 LAB — RAD ONC ARIA SESSION SUMMARY
Course Elapsed Days: 13
Plan Fractions Treated to Date: 9
Plan Prescribed Dose Per Fraction: 2 Gy
Plan Total Fractions Prescribed: 40
Plan Total Prescribed Dose: 80 Gy
Reference Point Dosage Given to Date: 18 Gy
Reference Point Session Dosage Given: 2 Gy
Session Number: 9

## 2022-12-13 ENCOUNTER — Ambulatory Visit
Admission: RE | Admit: 2022-12-13 | Discharge: 2022-12-13 | Disposition: A | Discharge status: Home / Self Care | Payer: Medicare PPO | Source: Ambulatory Visit | Attending: Radiation Oncology | Admitting: Radiation Oncology

## 2022-12-13 ENCOUNTER — Other Ambulatory Visit: Payer: Self-pay

## 2022-12-13 DIAGNOSIS — C61 Malignant neoplasm of prostate: Secondary | ICD-10-CM | POA: Insufficient documentation

## 2022-12-13 DIAGNOSIS — Z87891 Personal history of nicotine dependence: Secondary | ICD-10-CM | POA: Diagnosis not present

## 2022-12-13 DIAGNOSIS — Z510 Encounter for antineoplastic radiation therapy: Secondary | ICD-10-CM | POA: Diagnosis present

## 2022-12-13 LAB — RAD ONC ARIA SESSION SUMMARY
Course Elapsed Days: 14
Plan Fractions Treated to Date: 10
Plan Prescribed Dose Per Fraction: 2 Gy
Plan Total Fractions Prescribed: 40
Plan Total Prescribed Dose: 80 Gy
Reference Point Dosage Given to Date: 20 Gy
Reference Point Session Dosage Given: 2 Gy
Session Number: 10

## 2022-12-14 ENCOUNTER — Other Ambulatory Visit: Payer: Self-pay

## 2022-12-14 ENCOUNTER — Ambulatory Visit
Admission: RE | Admit: 2022-12-14 | Discharge: 2022-12-14 | Disposition: A | Discharge status: Home / Self Care | Payer: Medicare PPO | Source: Ambulatory Visit | Attending: Radiation Oncology | Admitting: Radiation Oncology

## 2022-12-14 DIAGNOSIS — Z510 Encounter for antineoplastic radiation therapy: Secondary | ICD-10-CM | POA: Diagnosis not present

## 2022-12-14 LAB — RAD ONC ARIA SESSION SUMMARY
Course Elapsed Days: 15
Plan Fractions Treated to Date: 11
Plan Prescribed Dose Per Fraction: 2 Gy
Plan Total Fractions Prescribed: 40
Plan Total Prescribed Dose: 80 Gy
Reference Point Dosage Given to Date: 22 Gy
Reference Point Session Dosage Given: 2 Gy
Session Number: 11

## 2022-12-16 ENCOUNTER — Ambulatory Visit: Payer: Medicare PPO

## 2022-12-17 ENCOUNTER — Ambulatory Visit
Admission: RE | Admit: 2022-12-17 | Discharge: 2022-12-17 | Disposition: A | Discharge status: Home / Self Care | Payer: Medicare PPO | Source: Ambulatory Visit | Attending: Radiation Oncology | Admitting: Radiation Oncology

## 2022-12-17 ENCOUNTER — Other Ambulatory Visit: Payer: Self-pay

## 2022-12-17 DIAGNOSIS — Z510 Encounter for antineoplastic radiation therapy: Secondary | ICD-10-CM | POA: Diagnosis not present

## 2022-12-17 LAB — RAD ONC ARIA SESSION SUMMARY
Course Elapsed Days: 18
Plan Fractions Treated to Date: 12
Plan Prescribed Dose Per Fraction: 2 Gy
Plan Total Fractions Prescribed: 40
Plan Total Prescribed Dose: 80 Gy
Reference Point Dosage Given to Date: 24 Gy
Reference Point Session Dosage Given: 2 Gy
Session Number: 12

## 2022-12-18 ENCOUNTER — Other Ambulatory Visit: Payer: Self-pay

## 2022-12-18 ENCOUNTER — Ambulatory Visit
Admission: RE | Admit: 2022-12-18 | Discharge: 2022-12-18 | Disposition: A | Discharge status: Home / Self Care | Payer: Medicare PPO | Source: Ambulatory Visit | Attending: Radiation Oncology | Admitting: Radiation Oncology

## 2022-12-18 DIAGNOSIS — Z510 Encounter for antineoplastic radiation therapy: Secondary | ICD-10-CM | POA: Diagnosis not present

## 2022-12-18 LAB — RAD ONC ARIA SESSION SUMMARY
Course Elapsed Days: 19
Plan Fractions Treated to Date: 13
Plan Prescribed Dose Per Fraction: 2 Gy
Plan Total Fractions Prescribed: 40
Plan Total Prescribed Dose: 80 Gy
Reference Point Dosage Given to Date: 26 Gy
Reference Point Session Dosage Given: 2 Gy
Session Number: 13

## 2022-12-19 ENCOUNTER — Inpatient Hospital Stay: Payer: Medicare PPO

## 2022-12-19 ENCOUNTER — Ambulatory Visit: Admission: RE | Admit: 2022-12-19 | Payer: Medicare PPO | Source: Ambulatory Visit

## 2022-12-19 ENCOUNTER — Other Ambulatory Visit: Payer: Self-pay

## 2022-12-19 DIAGNOSIS — C61 Malignant neoplasm of prostate: Secondary | ICD-10-CM | POA: Insufficient documentation

## 2022-12-19 DIAGNOSIS — Z510 Encounter for antineoplastic radiation therapy: Secondary | ICD-10-CM | POA: Diagnosis not present

## 2022-12-19 LAB — CBC (CANCER CENTER ONLY)
HCT: 42.5 % (ref 39.0–52.0)
Hemoglobin: 13.8 g/dL (ref 13.0–17.0)
MCH: 28.6 pg (ref 26.0–34.0)
MCHC: 32.5 g/dL (ref 30.0–36.0)
MCV: 88 fL (ref 80.0–100.0)
Platelet Count: 164 10*3/uL (ref 150–400)
RBC: 4.83 MIL/uL (ref 4.22–5.81)
RDW: 13.2 % (ref 11.5–15.5)
WBC Count: 6.1 10*3/uL (ref 4.0–10.5)
nRBC: 0 % (ref 0.0–0.2)

## 2022-12-19 LAB — RAD ONC ARIA SESSION SUMMARY
Course Elapsed Days: 20
Plan Fractions Treated to Date: 14
Plan Prescribed Dose Per Fraction: 2 Gy
Plan Total Fractions Prescribed: 40
Plan Total Prescribed Dose: 80 Gy
Reference Point Dosage Given to Date: 28 Gy
Reference Point Session Dosage Given: 2 Gy
Session Number: 14

## 2022-12-20 ENCOUNTER — Ambulatory Visit
Admission: RE | Admit: 2022-12-20 | Discharge: 2022-12-20 | Disposition: A | Discharge status: Home / Self Care | Payer: Medicare PPO | Source: Ambulatory Visit | Attending: Radiation Oncology | Admitting: Radiation Oncology

## 2022-12-20 ENCOUNTER — Other Ambulatory Visit: Payer: Self-pay

## 2022-12-20 DIAGNOSIS — Z510 Encounter for antineoplastic radiation therapy: Secondary | ICD-10-CM | POA: Diagnosis not present

## 2022-12-20 LAB — RAD ONC ARIA SESSION SUMMARY
Course Elapsed Days: 21
Plan Fractions Treated to Date: 15
Plan Prescribed Dose Per Fraction: 2 Gy
Plan Total Fractions Prescribed: 40
Plan Total Prescribed Dose: 80 Gy
Reference Point Dosage Given to Date: 30 Gy
Reference Point Session Dosage Given: 2 Gy
Session Number: 15

## 2022-12-21 ENCOUNTER — Other Ambulatory Visit: Payer: Self-pay

## 2022-12-21 ENCOUNTER — Ambulatory Visit
Admission: RE | Admit: 2022-12-21 | Discharge: 2022-12-21 | Disposition: A | Discharge status: Home / Self Care | Payer: Medicare PPO | Source: Ambulatory Visit | Attending: Radiation Oncology | Admitting: Radiation Oncology

## 2022-12-21 DIAGNOSIS — Z510 Encounter for antineoplastic radiation therapy: Secondary | ICD-10-CM | POA: Diagnosis not present

## 2022-12-21 LAB — RAD ONC ARIA SESSION SUMMARY
Course Elapsed Days: 22
Plan Fractions Treated to Date: 16
Plan Prescribed Dose Per Fraction: 2 Gy
Plan Total Fractions Prescribed: 40
Plan Total Prescribed Dose: 80 Gy
Reference Point Dosage Given to Date: 32 Gy
Reference Point Session Dosage Given: 2 Gy
Session Number: 16

## 2022-12-22 ENCOUNTER — Ambulatory Visit: Payer: Medicare PPO

## 2022-12-24 ENCOUNTER — Ambulatory Visit
Admission: RE | Admit: 2022-12-24 | Discharge: 2022-12-24 | Disposition: A | Discharge status: Home / Self Care | Payer: Medicare PPO | Source: Ambulatory Visit | Attending: Radiation Oncology | Admitting: Radiation Oncology

## 2022-12-24 ENCOUNTER — Other Ambulatory Visit: Payer: Self-pay

## 2022-12-24 DIAGNOSIS — Z510 Encounter for antineoplastic radiation therapy: Secondary | ICD-10-CM | POA: Diagnosis not present

## 2022-12-24 LAB — RAD ONC ARIA SESSION SUMMARY
Course Elapsed Days: 25
Plan Fractions Treated to Date: 17
Plan Prescribed Dose Per Fraction: 2 Gy
Plan Total Fractions Prescribed: 40
Plan Total Prescribed Dose: 80 Gy
Reference Point Dosage Given to Date: 34 Gy
Reference Point Session Dosage Given: 2 Gy
Session Number: 17

## 2022-12-25 ENCOUNTER — Ambulatory Visit
Admission: RE | Admit: 2022-12-25 | Discharge: 2022-12-25 | Disposition: A | Discharge status: Home / Self Care | Payer: Medicare PPO | Source: Ambulatory Visit | Attending: Radiation Oncology | Admitting: Radiation Oncology

## 2022-12-25 ENCOUNTER — Other Ambulatory Visit: Payer: Self-pay

## 2022-12-25 DIAGNOSIS — Z510 Encounter for antineoplastic radiation therapy: Secondary | ICD-10-CM | POA: Diagnosis not present

## 2022-12-25 LAB — RAD ONC ARIA SESSION SUMMARY
Course Elapsed Days: 26
Plan Fractions Treated to Date: 18
Plan Prescribed Dose Per Fraction: 2 Gy
Plan Total Fractions Prescribed: 40
Plan Total Prescribed Dose: 80 Gy
Reference Point Dosage Given to Date: 36 Gy
Reference Point Session Dosage Given: 2 Gy
Session Number: 18

## 2022-12-26 ENCOUNTER — Ambulatory Visit
Admission: RE | Admit: 2022-12-26 | Discharge: 2022-12-26 | Disposition: A | Discharge status: Home / Self Care | Payer: Medicare PPO | Source: Ambulatory Visit | Attending: Radiation Oncology | Admitting: Radiation Oncology

## 2022-12-26 ENCOUNTER — Other Ambulatory Visit: Payer: Self-pay

## 2022-12-26 DIAGNOSIS — Z510 Encounter for antineoplastic radiation therapy: Secondary | ICD-10-CM | POA: Diagnosis not present

## 2022-12-26 LAB — RAD ONC ARIA SESSION SUMMARY
Course Elapsed Days: 27
Plan Fractions Treated to Date: 19
Plan Prescribed Dose Per Fraction: 2 Gy
Plan Total Fractions Prescribed: 40
Plan Total Prescribed Dose: 80 Gy
Reference Point Dosage Given to Date: 38 Gy
Reference Point Session Dosage Given: 2 Gy
Session Number: 19

## 2022-12-27 ENCOUNTER — Other Ambulatory Visit: Payer: Self-pay

## 2022-12-27 ENCOUNTER — Ambulatory Visit
Admission: RE | Admit: 2022-12-27 | Discharge: 2022-12-27 | Disposition: A | Discharge status: Home / Self Care | Payer: Medicare PPO | Source: Ambulatory Visit | Attending: Radiation Oncology | Admitting: Radiation Oncology

## 2022-12-27 DIAGNOSIS — Z510 Encounter for antineoplastic radiation therapy: Secondary | ICD-10-CM | POA: Diagnosis not present

## 2022-12-27 LAB — RAD ONC ARIA SESSION SUMMARY
Course Elapsed Days: 28
Plan Fractions Treated to Date: 20
Plan Prescribed Dose Per Fraction: 2 Gy
Plan Total Fractions Prescribed: 40
Plan Total Prescribed Dose: 80 Gy
Reference Point Dosage Given to Date: 40 Gy
Reference Point Session Dosage Given: 2 Gy
Session Number: 20

## 2022-12-28 ENCOUNTER — Other Ambulatory Visit: Payer: Self-pay

## 2022-12-28 ENCOUNTER — Ambulatory Visit
Admission: RE | Admit: 2022-12-28 | Discharge: 2022-12-28 | Disposition: A | Discharge status: Home / Self Care | Payer: Medicare PPO | Source: Ambulatory Visit | Attending: Radiation Oncology | Admitting: Radiation Oncology

## 2022-12-28 DIAGNOSIS — Z510 Encounter for antineoplastic radiation therapy: Secondary | ICD-10-CM | POA: Diagnosis not present

## 2022-12-28 LAB — RAD ONC ARIA SESSION SUMMARY
Course Elapsed Days: 29
Plan Fractions Treated to Date: 21
Plan Prescribed Dose Per Fraction: 2 Gy
Plan Total Fractions Prescribed: 40
Plan Total Prescribed Dose: 80 Gy
Reference Point Dosage Given to Date: 42 Gy
Reference Point Session Dosage Given: 2 Gy
Session Number: 21

## 2022-12-29 ENCOUNTER — Other Ambulatory Visit: Payer: Self-pay

## 2022-12-29 ENCOUNTER — Emergency Department
Admission: EM | Admit: 2022-12-29 | Discharge: 2022-12-29 | Disposition: A | Discharge status: Home / Self Care | Payer: Medicare PPO | Attending: Emergency Medicine | Admitting: Emergency Medicine

## 2022-12-29 ENCOUNTER — Encounter: Payer: Self-pay | Admitting: Emergency Medicine

## 2022-12-29 DIAGNOSIS — R55 Syncope and collapse: Secondary | ICD-10-CM | POA: Diagnosis present

## 2022-12-29 DIAGNOSIS — E119 Type 2 diabetes mellitus without complications: Secondary | ICD-10-CM | POA: Diagnosis not present

## 2022-12-29 DIAGNOSIS — R300 Dysuria: Secondary | ICD-10-CM | POA: Insufficient documentation

## 2022-12-29 DIAGNOSIS — I10 Essential (primary) hypertension: Secondary | ICD-10-CM | POA: Diagnosis not present

## 2022-12-29 DIAGNOSIS — R1110 Vomiting, unspecified: Secondary | ICD-10-CM | POA: Diagnosis not present

## 2022-12-29 DIAGNOSIS — Z8546 Personal history of malignant neoplasm of prostate: Secondary | ICD-10-CM | POA: Insufficient documentation

## 2022-12-29 LAB — BASIC METABOLIC PANEL
Anion gap: 7 (ref 5–15)
BUN: 19 mg/dL (ref 8–23)
CO2: 24 mmol/L (ref 22–32)
Calcium: 8.5 mg/dL — ABNORMAL LOW (ref 8.9–10.3)
Chloride: 109 mmol/L (ref 98–111)
Creatinine, Ser: 0.83 mg/dL (ref 0.61–1.24)
GFR, Estimated: >60 mL/min (ref >60)
Glucose, Bld: 171 mg/dL — ABNORMAL HIGH (ref 70–99)
Potassium: 3.6 mmol/L (ref 3.5–5.1)
Sodium: 140 mmol/L (ref 135–145)

## 2022-12-29 LAB — CBC
HCT: 38.5 % — ABNORMAL LOW (ref 39.0–52.0)
Hemoglobin: 12.8 g/dL — ABNORMAL LOW (ref 13.0–17.0)
MCH: 29.1 pg (ref 26.0–34.0)
MCHC: 33.2 g/dL (ref 30.0–36.0)
MCV: 87.5 fL (ref 80.0–100.0)
Platelets: 143 10*3/uL — ABNORMAL LOW (ref 150–400)
RBC: 4.4 MIL/uL (ref 4.22–5.81)
RDW: 13.5 % (ref 11.5–15.5)
WBC: 6.2 10*3/uL (ref 4.0–10.5)
nRBC: 0 % (ref 0.0–0.2)

## 2022-12-29 LAB — URINALYSIS, ROUTINE W REFLEX MICROSCOPIC
Bilirubin Urine: NEGATIVE
Glucose, UA: NEGATIVE mg/dL
Ketones, ur: NEGATIVE mg/dL
Nitrite: NEGATIVE
Protein, ur: 30 mg/dL — AB
RBC / HPF: >50 RBC/hpf (ref 0–5)
Specific Gravity, Urine: 1.016 (ref 1.005–1.030)
Squamous Epithelial / HPF: NONE SEEN /HPF (ref 0–5)
WBC, UA: >50 WBC/hpf (ref 0–5)
pH: 5 (ref 5.0–8.0)

## 2022-12-29 LAB — CBG MONITORING, ED: Glucose-Capillary: 155 mg/dL — ABNORMAL HIGH (ref 70–99)

## 2022-12-29 MED ORDER — SODIUM CHLORIDE 0.9 % IV SOLN: Freq: Once | INTRAVENOUS | Status: AC

## 2022-12-29 MED ORDER — ONDANSETRON HCL 4 MG/2ML IJ SOLN
4.0000 mg | Freq: Once | INTRAMUSCULAR | Status: AC
  Administered 2022-12-29: 4 mg via INTRAVENOUS
  Filled 2022-12-29: qty 2

## 2022-12-29 NOTE — ED Triage Notes (Signed)
Pt arrives via EMS from restaurant where pt felt like he was having heat flash and then synocpal episode for a min. After, he was alert with eyes open but not speaking. Pt vomited after waking up. Pt being treated for prostate cancer. Bp initally 99/50 and given 500 cc NS and 4 mg zofran.

## 2022-12-29 NOTE — ED Provider Notes (Signed)
[facility] Provider Note    Event Date/Time   First MD Initiated Contact with Patient 12/29/22 1335     (approximate)   History   Loss of Consciousness   HPI  Roberto Leonard is a 82 y.o. male with a history of high blood pressure, diabetes, prostate cancer who is undergoing radiation and has been given Lupron injections who presents after likely near syncopal episode.  And wife reports they went to a restaurant in the area of the restaurant they were and was very hot, patient started to get sweaty and felt lightheaded.  He apparently then became briefly unresponsive and had an episode of vomiting.  He reports he feels well now and has no complaints.  No chest pain palpitations.  No abdominal pain.     Physical Exam   Triage Vital Signs: ED Triage Vitals  Encounter Vitals Group     BP 12/29/22 1308 115/73     Systolic BP Percentile --      Diastolic BP Percentile --      Pulse Rate 12/29/22 1308 (!) 55     Resp 12/29/22 1308 10     Temp 12/29/22 1308 97.6 F (36.4 C)     Temp Source 12/29/22 1308 Oral     SpO2 12/29/22 1308 98 %     Weight 12/29/22 1307 96 kg (211 lb 10.3 oz)     Height 12/29/22 1307 1.88 m (6\' 2" )     Head Circumference --      Peak Flow --      Pain Score 12/29/22 1308 0     Pain Loc --      Pain Education --      Exclude from Growth Chart --     Most recent vital signs: Vitals:   12/29/22 1400 12/29/22 1439  BP: 123/69 103/66  Pulse: 64 69  Resp: 15 18  Temp:    SpO2: 99% 100%     General: Awake, no distress.  CV:  Good peripheral perfusion.  Regular rate and rhythm Resp:  Normal effort.  Abd:  No distention.  No pulsatile mass Other:  Pulses equal bilaterally in the upper extremities.   ED Results / Procedures / Treatments   Labs (all labs ordered are listed, but only abnormal results are displayed) Labs Reviewed  BASIC METABOLIC PANEL - Abnormal; Notable for the following components:      Result  Value   Glucose, Bld 171 (*)    Calcium 8.5 (*)    All other components within normal limits  CBC - Abnormal; Notable for the following components:   Hemoglobin 12.8 (*)    HCT 38.5 (*)    Platelets 143 (*)    All other components within normal limits  CBG MONITORING, ED - Abnormal; Notable for the following components:   Glucose-Capillary 155 (*)    All other components within normal limits  URINE CULTURE  URINALYSIS, ROUTINE W REFLEX MICROSCOPIC     EKG  ED ECG REPORT I, Jene Every, the attending physician, personally viewed and interpreted this ECG.  Date: 12/29/2022  Rhythm: normal sinus rhythm QRS Axis: normal Intervals: normal ST/T Wave abnormalities: Right bundle branch block Narrative Interpretation: no evidence of acute ischemia    RADIOLOGY     PROCEDURES:  Critical Care performed:   Procedures   MEDICATIONS ORDERED IN ED: Medications  0.9 %  sodium chloride infusion (0 mLs Intravenous Stopped 12/29/22 1458)  ondansetron (ZOFRAN) injection 4 mg (  4 mg Intravenous Given 12/29/22 1411)     IMPRESSION / MDM / ASSESSMENT AND PLAN / ED COURSE  I reviewed the triage vital signs and the nursing notes. Patient's presentation is most consistent with acute presentation with potential threat to life or bodily function.  Patient presents after likely near syncopal episode as detailed above.  Suspect heat related versus vasovagal.  Doubt arrhythmia or cardiac cause given no chest pain or palpitations.  No new medications reported.  Not consistent with seizure.  Patient feels much better here, lab work is generally reassuring  Normal saline liter bolus infusing.  EKG is reassuring  Will check orthostatics, anticipate discharge with outpatient follow-up.  Orthostatics normal, appropriate for discharge      FINAL CLINICAL IMPRESSION(S) / ED DIAGNOSES   Final diagnoses:  Near syncope     Rx / DC Orders   ED Discharge Orders     None         Note:  This document was prepared using Dragon voice recognition software and may include unintentional dictation errors.   Jene Every, MD 12/29/22 1524

## 2022-12-31 ENCOUNTER — Ambulatory Visit
Admission: RE | Admit: 2022-12-31 | Discharge: 2022-12-31 | Disposition: A | Discharge status: Home / Self Care | Payer: Medicare PPO | Source: Ambulatory Visit | Attending: Radiation Oncology | Admitting: Radiation Oncology

## 2022-12-31 ENCOUNTER — Other Ambulatory Visit: Payer: Self-pay

## 2022-12-31 DIAGNOSIS — Z510 Encounter for antineoplastic radiation therapy: Secondary | ICD-10-CM | POA: Diagnosis not present

## 2022-12-31 LAB — RAD ONC ARIA SESSION SUMMARY
Course Elapsed Days: 32
Plan Fractions Treated to Date: 22
Plan Prescribed Dose Per Fraction: 2 Gy
Plan Total Fractions Prescribed: 40
Plan Total Prescribed Dose: 80 Gy
Reference Point Dosage Given to Date: 44 Gy
Reference Point Session Dosage Given: 2 Gy
Session Number: 22

## 2022-12-31 LAB — URINE CULTURE: Culture: >=100000 — AB

## 2023-01-01 ENCOUNTER — Ambulatory Visit
Admission: RE | Admit: 2023-01-01 | Discharge: 2023-01-01 | Disposition: A | Discharge status: Home / Self Care | Payer: Medicare PPO | Source: Ambulatory Visit | Attending: Radiation Oncology | Admitting: Radiation Oncology

## 2023-01-01 ENCOUNTER — Other Ambulatory Visit: Payer: Self-pay

## 2023-01-01 DIAGNOSIS — Z510 Encounter for antineoplastic radiation therapy: Secondary | ICD-10-CM | POA: Diagnosis not present

## 2023-01-01 LAB — RAD ONC ARIA SESSION SUMMARY
Course Elapsed Days: 33
Plan Fractions Treated to Date: 23
Plan Prescribed Dose Per Fraction: 2 Gy
Plan Total Fractions Prescribed: 40
Plan Total Prescribed Dose: 80 Gy
Reference Point Dosage Given to Date: 46 Gy
Reference Point Session Dosage Given: 2 Gy
Session Number: 23

## 2023-01-01 NOTE — Progress Notes (Signed)
ED Antimicrobial Stewardship Positive Culture Follow Up   Roberto Leonard is an 82 y.o. male who presented to [facility] on 12/29/2022 with a chief complaint of  Chief Complaint  Patient presents with   Loss of Consciousness    Recent Results (from the past 720 hour(s))  Urine Culture     Status: Abnormal   Collection Time: 12/29/22  3:06 PM   Specimen: Urine, Clean Catch  Result Value Ref Range Status   Specimen Description   Final    URINE, CLEAN CATCH Performed at [facility], [address], [place], [place] [address]    Special Requests   Final    NONE Performed at [facility], [address], [place], [place] [address]    Culture >=100,000 COLONIES/mL KLEBSIELLA PNEUMONIAE (A)  Final   Report Status 12/31/2022 FINAL  Final   Organism ID, Bacteria KLEBSIELLA PNEUMONIAE (A)  Final      Susceptibility   Klebsiella pneumoniae - MIC*    AMPICILLIN >=32 RESISTANT Resistant     CEFAZOLIN <=4 SENSITIVE Sensitive     CEFEPIME <=0.12 SENSITIVE Sensitive     CEFTRIAXONE <=0.25 SENSITIVE Sensitive     CIPROFLOXACIN 1 RESISTANT Resistant     GENTAMICIN >=16 RESISTANT Resistant     IMIPENEM <=0.25 SENSITIVE Sensitive     NITROFURANTOIN 128 RESISTANT Resistant     TRIMETH/SULFA >=320 RESISTANT Resistant     AMPICILLIN/SULBACTAM >=32 RESISTANT Resistant     PIP/TAZO <=4 SENSITIVE Sensitive     * >=100,000 COLONIES/mL KLEBSIELLA PNEUMONIAE    [x]  Patient discharged originally without antimicrobial agent and treatment is now indicated  New antibiotic prescription: To be sent by PCP(Dr. Sullivan Lone)  Spoke to patient regarding Ucx results. Stated that he had ucx done at family physician's office with the same results. PCP will call in rx for antibiotics later today. No further workup required.  Bettey Costa ,[facility] Clinical Pharmacist  01/01/2023, 10:31 AM

## 2023-01-02 ENCOUNTER — Inpatient Hospital Stay: Payer: Medicare PPO

## 2023-01-02 ENCOUNTER — Ambulatory Visit
Admission: RE | Admit: 2023-01-02 | Discharge: 2023-01-02 | Disposition: A | Discharge status: Home / Self Care | Payer: Medicare PPO | Source: Ambulatory Visit | Attending: Radiation Oncology | Admitting: Radiation Oncology

## 2023-01-02 ENCOUNTER — Other Ambulatory Visit: Payer: Self-pay

## 2023-01-02 DIAGNOSIS — Z510 Encounter for antineoplastic radiation therapy: Secondary | ICD-10-CM | POA: Diagnosis not present

## 2023-01-02 DIAGNOSIS — C61 Malignant neoplasm of prostate: Secondary | ICD-10-CM

## 2023-01-02 LAB — RAD ONC ARIA SESSION SUMMARY
Course Elapsed Days: 34
Plan Fractions Treated to Date: 24
Plan Prescribed Dose Per Fraction: 2 Gy
Plan Total Fractions Prescribed: 40
Plan Total Prescribed Dose: 80 Gy
Reference Point Dosage Given to Date: 48 Gy
Reference Point Session Dosage Given: 2 Gy
Session Number: 24

## 2023-01-02 LAB — CBC (CANCER CENTER ONLY)
HCT: 42.2 % (ref 39.0–52.0)
Hemoglobin: 14.1 g/dL (ref 13.0–17.0)
MCH: 29.2 pg (ref 26.0–34.0)
MCHC: 33.4 g/dL (ref 30.0–36.0)
MCV: 87.4 fL (ref 80.0–100.0)
Platelet Count: 145 10*3/uL — ABNORMAL LOW (ref 150–400)
RBC: 4.83 MIL/uL (ref 4.22–5.81)
RDW: 13.4 % (ref 11.5–15.5)
WBC Count: 6.7 10*3/uL (ref 4.0–10.5)
nRBC: 0 % (ref 0.0–0.2)

## 2023-01-03 ENCOUNTER — Ambulatory Visit
Admission: RE | Admit: 2023-01-03 | Discharge: 2023-01-03 | Disposition: A | Discharge status: Home / Self Care | Payer: Medicare PPO | Source: Ambulatory Visit | Attending: Radiation Oncology | Admitting: Radiation Oncology

## 2023-01-03 ENCOUNTER — Other Ambulatory Visit: Payer: Self-pay

## 2023-01-03 DIAGNOSIS — Z510 Encounter for antineoplastic radiation therapy: Secondary | ICD-10-CM | POA: Diagnosis not present

## 2023-01-03 LAB — RAD ONC ARIA SESSION SUMMARY
Course Elapsed Days: 35
Plan Fractions Treated to Date: 25
Plan Prescribed Dose Per Fraction: 2 Gy
Plan Total Fractions Prescribed: 40
Plan Total Prescribed Dose: 80 Gy
Reference Point Dosage Given to Date: 50 Gy
Reference Point Session Dosage Given: 2 Gy
Session Number: 25

## 2023-01-04 ENCOUNTER — Ambulatory Visit
Admission: RE | Admit: 2023-01-04 | Discharge: 2023-01-04 | Disposition: A | Discharge status: Home / Self Care | Payer: Medicare PPO | Source: Ambulatory Visit | Attending: Radiation Oncology | Admitting: Radiation Oncology

## 2023-01-04 ENCOUNTER — Other Ambulatory Visit: Payer: Self-pay | Admitting: *Deleted

## 2023-01-04 ENCOUNTER — Other Ambulatory Visit: Payer: Self-pay

## 2023-01-04 ENCOUNTER — Other Ambulatory Visit: Payer: Self-pay | Admitting: Radiation Oncology

## 2023-01-04 DIAGNOSIS — Z510 Encounter for antineoplastic radiation therapy: Secondary | ICD-10-CM | POA: Diagnosis not present

## 2023-01-04 LAB — RAD ONC ARIA SESSION SUMMARY
Course Elapsed Days: 36
Plan Fractions Treated to Date: 26
Plan Prescribed Dose Per Fraction: 2 Gy
Plan Total Fractions Prescribed: 40
Plan Total Prescribed Dose: 80 Gy
Reference Point Dosage Given to Date: 52 Gy
Reference Point Session Dosage Given: 2 Gy
Session Number: 26

## 2023-01-04 MED ORDER — TAMSULOSIN HCL 0.4 MG PO CAPS: 0.4000 mg | ORAL_CAPSULE | Freq: Two times a day (BID) | ORAL | 1 refills | Status: DC

## 2023-01-04 MED ORDER — MIRABEGRON ER 50 MG PO TB24: 50.0000 mg | ORAL_TABLET | Freq: Two times a day (BID) | ORAL | 1 refills | Status: DC

## 2023-01-07 ENCOUNTER — Ambulatory Visit
Admission: RE | Admit: 2023-01-07 | Discharge: 2023-01-07 | Disposition: A | Discharge status: Home / Self Care | Payer: Medicare PPO | Source: Ambulatory Visit | Attending: Radiation Oncology | Admitting: Radiation Oncology

## 2023-01-07 ENCOUNTER — Other Ambulatory Visit: Payer: Self-pay

## 2023-01-07 DIAGNOSIS — Z510 Encounter for antineoplastic radiation therapy: Secondary | ICD-10-CM | POA: Diagnosis not present

## 2023-01-07 LAB — RAD ONC ARIA SESSION SUMMARY
Course Elapsed Days: 39
Plan Fractions Treated to Date: 27
Plan Prescribed Dose Per Fraction: 2 Gy
Plan Total Fractions Prescribed: 40
Plan Total Prescribed Dose: 80 Gy
Reference Point Dosage Given to Date: 54 Gy
Reference Point Session Dosage Given: 2 Gy
Session Number: 27

## 2023-01-08 ENCOUNTER — Other Ambulatory Visit: Payer: Self-pay

## 2023-01-08 ENCOUNTER — Ambulatory Visit
Admission: RE | Admit: 2023-01-08 | Discharge: 2023-01-08 | Disposition: A | Discharge status: Home / Self Care | Payer: Medicare PPO | Source: Ambulatory Visit | Attending: Radiation Oncology | Admitting: Radiation Oncology

## 2023-01-08 DIAGNOSIS — Z510 Encounter for antineoplastic radiation therapy: Secondary | ICD-10-CM | POA: Diagnosis not present

## 2023-01-08 LAB — RAD ONC ARIA SESSION SUMMARY
Course Elapsed Days: 40
Plan Fractions Treated to Date: 28
Plan Prescribed Dose Per Fraction: 2 Gy
Plan Total Fractions Prescribed: 40
Plan Total Prescribed Dose: 80 Gy
Reference Point Dosage Given to Date: 56 Gy
Reference Point Session Dosage Given: 2 Gy
Session Number: 28

## 2023-01-09 ENCOUNTER — Other Ambulatory Visit: Payer: Self-pay

## 2023-01-09 ENCOUNTER — Ambulatory Visit
Admission: RE | Admit: 2023-01-09 | Discharge: 2023-01-09 | Disposition: A | Discharge status: Home / Self Care | Payer: Medicare PPO | Source: Ambulatory Visit | Attending: Radiation Oncology | Admitting: Radiation Oncology

## 2023-01-09 DIAGNOSIS — Z510 Encounter for antineoplastic radiation therapy: Secondary | ICD-10-CM | POA: Diagnosis not present

## 2023-01-09 LAB — RAD ONC ARIA SESSION SUMMARY
Course Elapsed Days: 41
Plan Fractions Treated to Date: 29
Plan Prescribed Dose Per Fraction: 2 Gy
Plan Total Fractions Prescribed: 40
Plan Total Prescribed Dose: 80 Gy
Reference Point Dosage Given to Date: 58 Gy
Reference Point Session Dosage Given: 2 Gy
Session Number: 29

## 2023-01-10 ENCOUNTER — Other Ambulatory Visit: Payer: Self-pay

## 2023-01-10 ENCOUNTER — Ambulatory Visit
Admission: RE | Admit: 2023-01-10 | Discharge: 2023-01-10 | Disposition: A | Discharge status: Home / Self Care | Payer: Medicare PPO | Source: Ambulatory Visit | Attending: Radiation Oncology | Admitting: Radiation Oncology

## 2023-01-10 DIAGNOSIS — Z510 Encounter for antineoplastic radiation therapy: Secondary | ICD-10-CM | POA: Diagnosis not present

## 2023-01-10 LAB — RAD ONC ARIA SESSION SUMMARY
Course Elapsed Days: 42
Plan Fractions Treated to Date: 30
Plan Prescribed Dose Per Fraction: 2 Gy
Plan Total Fractions Prescribed: 40
Plan Total Prescribed Dose: 80 Gy
Reference Point Dosage Given to Date: 60 Gy
Reference Point Session Dosage Given: 2 Gy
Session Number: 30

## 2023-01-11 ENCOUNTER — Other Ambulatory Visit: Payer: Self-pay

## 2023-01-11 ENCOUNTER — Ambulatory Visit: Admission: RE | Admit: 2023-01-11 | Payer: Medicare PPO | Source: Ambulatory Visit

## 2023-01-11 DIAGNOSIS — Z510 Encounter for antineoplastic radiation therapy: Secondary | ICD-10-CM | POA: Diagnosis not present

## 2023-01-11 LAB — RAD ONC ARIA SESSION SUMMARY
Course Elapsed Days: 43
Plan Fractions Treated to Date: 31
Plan Prescribed Dose Per Fraction: 2 Gy
Plan Total Fractions Prescribed: 40
Plan Total Prescribed Dose: 80 Gy
Reference Point Dosage Given to Date: 62 Gy
Reference Point Session Dosage Given: 2 Gy
Session Number: 31

## 2023-01-15 ENCOUNTER — Ambulatory Visit
Admission: RE | Admit: 2023-01-15 | Discharge: 2023-01-15 | Disposition: A | Discharge status: Home / Self Care | Payer: Medicare PPO | Source: Ambulatory Visit | Attending: Radiation Oncology | Admitting: Radiation Oncology

## 2023-01-15 ENCOUNTER — Other Ambulatory Visit: Payer: Self-pay

## 2023-01-15 DIAGNOSIS — Z510 Encounter for antineoplastic radiation therapy: Secondary | ICD-10-CM | POA: Diagnosis present

## 2023-01-15 DIAGNOSIS — Z87891 Personal history of nicotine dependence: Secondary | ICD-10-CM | POA: Insufficient documentation

## 2023-01-15 DIAGNOSIS — C61 Malignant neoplasm of prostate: Secondary | ICD-10-CM | POA: Diagnosis present

## 2023-01-15 LAB — RAD ONC ARIA SESSION SUMMARY
Course Elapsed Days: 47
Plan Fractions Treated to Date: 32
Plan Prescribed Dose Per Fraction: 2 Gy
Plan Total Fractions Prescribed: 40
Plan Total Prescribed Dose: 80 Gy
Reference Point Dosage Given to Date: 64 Gy
Reference Point Session Dosage Given: 2 Gy
Session Number: 32

## 2023-01-16 ENCOUNTER — Other Ambulatory Visit: Payer: Self-pay

## 2023-01-16 ENCOUNTER — Ambulatory Visit: Admission: RE | Admit: 2023-01-16 | Payer: Medicare PPO | Source: Ambulatory Visit

## 2023-01-16 ENCOUNTER — Inpatient Hospital Stay: Payer: Medicare PPO

## 2023-01-16 DIAGNOSIS — C61 Malignant neoplasm of prostate: Secondary | ICD-10-CM | POA: Insufficient documentation

## 2023-01-16 DIAGNOSIS — Z510 Encounter for antineoplastic radiation therapy: Secondary | ICD-10-CM | POA: Diagnosis not present

## 2023-01-16 LAB — RAD ONC ARIA SESSION SUMMARY
Course Elapsed Days: 48
Plan Fractions Treated to Date: 33
Plan Prescribed Dose Per Fraction: 2 Gy
Plan Total Fractions Prescribed: 40
Plan Total Prescribed Dose: 80 Gy
Reference Point Dosage Given to Date: 66 Gy
Reference Point Session Dosage Given: 2 Gy
Session Number: 33

## 2023-01-16 LAB — CBC (CANCER CENTER ONLY)
HCT: 43.5 % (ref 39.0–52.0)
Hemoglobin: 14.3 g/dL (ref 13.0–17.0)
MCH: 29.1 pg (ref 26.0–34.0)
MCHC: 32.9 g/dL (ref 30.0–36.0)
MCV: 88.6 fL (ref 80.0–100.0)
Platelet Count: 150 10*3/uL (ref 150–400)
RBC: 4.91 MIL/uL (ref 4.22–5.81)
RDW: 13.6 % (ref 11.5–15.5)
WBC Count: 4.4 10*3/uL (ref 4.0–10.5)
nRBC: 0 % (ref 0.0–0.2)

## 2023-01-17 ENCOUNTER — Other Ambulatory Visit: Payer: Self-pay

## 2023-01-17 ENCOUNTER — Ambulatory Visit
Admission: RE | Admit: 2023-01-17 | Discharge: 2023-01-17 | Disposition: A | Discharge status: Home / Self Care | Payer: Medicare PPO | Source: Ambulatory Visit | Attending: Radiation Oncology | Admitting: Radiation Oncology

## 2023-01-17 DIAGNOSIS — Z510 Encounter for antineoplastic radiation therapy: Secondary | ICD-10-CM | POA: Diagnosis not present

## 2023-01-17 LAB — RAD ONC ARIA SESSION SUMMARY
Course Elapsed Days: 49
Plan Fractions Treated to Date: 34
Plan Prescribed Dose Per Fraction: 2 Gy
Plan Total Fractions Prescribed: 40
Plan Total Prescribed Dose: 80 Gy
Reference Point Dosage Given to Date: 68 Gy
Reference Point Session Dosage Given: 2 Gy
Session Number: 34

## 2023-01-18 ENCOUNTER — Ambulatory Visit
Admission: RE | Admit: 2023-01-18 | Discharge: 2023-01-18 | Disposition: A | Discharge status: Home / Self Care | Payer: Medicare PPO | Source: Ambulatory Visit | Attending: Radiation Oncology | Admitting: Radiation Oncology

## 2023-01-18 ENCOUNTER — Other Ambulatory Visit: Payer: Self-pay

## 2023-01-18 DIAGNOSIS — Z510 Encounter for antineoplastic radiation therapy: Secondary | ICD-10-CM | POA: Diagnosis not present

## 2023-01-18 LAB — RAD ONC ARIA SESSION SUMMARY
Course Elapsed Days: 50
Plan Fractions Treated to Date: 35
Plan Prescribed Dose Per Fraction: 2 Gy
Plan Total Fractions Prescribed: 40
Plan Total Prescribed Dose: 80 Gy
Reference Point Dosage Given to Date: 70 Gy
Reference Point Session Dosage Given: 2 Gy
Session Number: 35

## 2023-01-19 ENCOUNTER — Ambulatory Visit: Payer: Medicare PPO

## 2023-01-21 ENCOUNTER — Other Ambulatory Visit: Payer: Self-pay

## 2023-01-21 ENCOUNTER — Ambulatory Visit
Admission: RE | Admit: 2023-01-21 | Discharge: 2023-01-21 | Disposition: A | Discharge status: Home / Self Care | Payer: Medicare PPO | Source: Ambulatory Visit | Attending: Radiation Oncology | Admitting: Radiation Oncology

## 2023-01-21 DIAGNOSIS — Z510 Encounter for antineoplastic radiation therapy: Secondary | ICD-10-CM | POA: Diagnosis not present

## 2023-01-21 LAB — RAD ONC ARIA SESSION SUMMARY
Course Elapsed Days: 53
Plan Fractions Treated to Date: 36
Plan Prescribed Dose Per Fraction: 2 Gy
Plan Total Fractions Prescribed: 40
Plan Total Prescribed Dose: 80 Gy
Reference Point Dosage Given to Date: 72 Gy
Reference Point Session Dosage Given: 2 Gy
Session Number: 36

## 2023-01-22 ENCOUNTER — Ambulatory Visit
Admission: RE | Admit: 2023-01-22 | Discharge: 2023-01-22 | Disposition: A | Discharge status: Home / Self Care | Payer: Medicare PPO | Source: Ambulatory Visit | Attending: Radiation Oncology | Admitting: Radiation Oncology

## 2023-01-22 ENCOUNTER — Other Ambulatory Visit: Payer: Self-pay

## 2023-01-22 DIAGNOSIS — Z510 Encounter for antineoplastic radiation therapy: Secondary | ICD-10-CM | POA: Diagnosis not present

## 2023-01-22 LAB — RAD ONC ARIA SESSION SUMMARY
Course Elapsed Days: 54
Plan Fractions Treated to Date: 37
Plan Prescribed Dose Per Fraction: 2 Gy
Plan Total Fractions Prescribed: 40
Plan Total Prescribed Dose: 80 Gy
Reference Point Dosage Given to Date: 74 Gy
Reference Point Session Dosage Given: 2 Gy
Session Number: 37

## 2023-01-23 ENCOUNTER — Other Ambulatory Visit: Payer: Self-pay

## 2023-01-23 ENCOUNTER — Ambulatory Visit
Admission: RE | Admit: 2023-01-23 | Discharge: 2023-01-23 | Disposition: A | Discharge status: Home / Self Care | Payer: Medicare PPO | Source: Ambulatory Visit | Attending: Radiation Oncology | Admitting: Radiation Oncology

## 2023-01-23 DIAGNOSIS — Z510 Encounter for antineoplastic radiation therapy: Secondary | ICD-10-CM | POA: Diagnosis not present

## 2023-01-23 LAB — RAD ONC ARIA SESSION SUMMARY
Course Elapsed Days: 55
Plan Fractions Treated to Date: 38
Plan Prescribed Dose Per Fraction: 2 Gy
Plan Total Fractions Prescribed: 40
Plan Total Prescribed Dose: 80 Gy
Reference Point Dosage Given to Date: 76 Gy
Reference Point Session Dosage Given: 2 Gy
Session Number: 38

## 2023-01-24 ENCOUNTER — Ambulatory Visit
Admission: RE | Admit: 2023-01-24 | Discharge: 2023-01-24 | Disposition: A | Discharge status: Home / Self Care | Payer: Medicare PPO | Source: Ambulatory Visit | Attending: Radiation Oncology | Admitting: Radiation Oncology

## 2023-01-24 ENCOUNTER — Other Ambulatory Visit: Payer: Self-pay

## 2023-01-24 ENCOUNTER — Ambulatory Visit: Payer: Medicare PPO

## 2023-01-24 DIAGNOSIS — Z510 Encounter for antineoplastic radiation therapy: Secondary | ICD-10-CM | POA: Diagnosis not present

## 2023-01-24 LAB — RAD ONC ARIA SESSION SUMMARY
Course Elapsed Days: 56
Plan Fractions Treated to Date: 39
Plan Prescribed Dose Per Fraction: 2 Gy
Plan Total Fractions Prescribed: 40
Plan Total Prescribed Dose: 80 Gy
Reference Point Dosage Given to Date: 78 Gy
Reference Point Session Dosage Given: 2 Gy
Session Number: 39

## 2023-01-25 ENCOUNTER — Ambulatory Visit
Admission: RE | Admit: 2023-01-25 | Discharge: 2023-01-25 | Disposition: A | Discharge status: Home / Self Care | Payer: Medicare PPO | Source: Ambulatory Visit | Attending: Radiation Oncology | Admitting: Radiation Oncology

## 2023-01-25 ENCOUNTER — Other Ambulatory Visit: Payer: Self-pay

## 2023-01-25 DIAGNOSIS — Z510 Encounter for antineoplastic radiation therapy: Secondary | ICD-10-CM | POA: Diagnosis not present

## 2023-01-25 LAB — RAD ONC ARIA SESSION SUMMARY
Course Elapsed Days: 57
Plan Fractions Treated to Date: 40
Plan Prescribed Dose Per Fraction: 2 Gy
Plan Total Fractions Prescribed: 40
Plan Total Prescribed Dose: 80 Gy
Reference Point Dosage Given to Date: 80 Gy
Reference Point Session Dosage Given: 2 Gy
Session Number: 40

## 2023-01-27 ENCOUNTER — Other Ambulatory Visit: Payer: Self-pay | Admitting: Radiation Oncology

## 2023-02-09 ENCOUNTER — Other Ambulatory Visit: Payer: Self-pay

## 2023-02-09 ENCOUNTER — Inpatient Hospital Stay: Payer: Medicare PPO

## 2023-02-09 ENCOUNTER — Emergency Department: Payer: Medicare PPO

## 2023-02-09 ENCOUNTER — Inpatient Hospital Stay
Admission: EM | Admit: 2023-02-09 | Discharge: 2023-02-11 | DRG: 872 | Disposition: A | Discharge status: Home / Self Care | Payer: Medicare PPO | Attending: Internal Medicine | Admitting: Internal Medicine

## 2023-02-09 DIAGNOSIS — E119 Type 2 diabetes mellitus without complications: Secondary | ICD-10-CM | POA: Diagnosis present

## 2023-02-09 DIAGNOSIS — K760 Fatty (change of) liver, not elsewhere classified: Secondary | ICD-10-CM | POA: Diagnosis present

## 2023-02-09 DIAGNOSIS — Z87440 Personal history of urinary (tract) infections: Secondary | ICD-10-CM

## 2023-02-09 DIAGNOSIS — Z923 Personal history of irradiation: Secondary | ICD-10-CM | POA: Diagnosis not present

## 2023-02-09 DIAGNOSIS — Z87891 Personal history of nicotine dependence: Secondary | ICD-10-CM

## 2023-02-09 DIAGNOSIS — N200 Calculus of kidney: Secondary | ICD-10-CM | POA: Diagnosis present

## 2023-02-09 DIAGNOSIS — R6520 Severe sepsis without septic shock: Secondary | ICD-10-CM | POA: Diagnosis present

## 2023-02-09 DIAGNOSIS — N390 Urinary tract infection, site not specified: Secondary | ICD-10-CM | POA: Diagnosis present

## 2023-02-09 DIAGNOSIS — R310 Gross hematuria: Secondary | ICD-10-CM | POA: Diagnosis present

## 2023-02-09 DIAGNOSIS — H409 Unspecified glaucoma: Secondary | ICD-10-CM | POA: Diagnosis present

## 2023-02-09 DIAGNOSIS — B961 Klebsiella pneumoniae [K. pneumoniae] as the cause of diseases classified elsewhere: Secondary | ICD-10-CM | POA: Diagnosis present

## 2023-02-09 DIAGNOSIS — N3001 Acute cystitis with hematuria: Principal | ICD-10-CM

## 2023-02-09 DIAGNOSIS — A4159 Other Gram-negative sepsis: Principal | ICD-10-CM | POA: Diagnosis present

## 2023-02-09 DIAGNOSIS — Z833 Family history of diabetes mellitus: Secondary | ICD-10-CM | POA: Diagnosis not present

## 2023-02-09 DIAGNOSIS — D6869 Other thrombophilia: Secondary | ICD-10-CM | POA: Diagnosis present

## 2023-02-09 DIAGNOSIS — A419 Sepsis, unspecified organism: Secondary | ICD-10-CM | POA: Diagnosis not present

## 2023-02-09 DIAGNOSIS — I10 Essential (primary) hypertension: Secondary | ICD-10-CM | POA: Diagnosis present

## 2023-02-09 DIAGNOSIS — Z8249 Family history of ischemic heart disease and other diseases of the circulatory system: Secondary | ICD-10-CM

## 2023-02-09 DIAGNOSIS — Z7984 Long term (current) use of oral hypoglycemic drugs: Secondary | ICD-10-CM | POA: Diagnosis not present

## 2023-02-09 DIAGNOSIS — Z8719 Personal history of other diseases of the digestive system: Secondary | ICD-10-CM

## 2023-02-09 DIAGNOSIS — Z882 Allergy status to sulfonamides status: Secondary | ICD-10-CM

## 2023-02-09 DIAGNOSIS — Z1623 Resistance to quinolones and fluoroquinolones: Secondary | ICD-10-CM | POA: Diagnosis present

## 2023-02-09 DIAGNOSIS — E785 Hyperlipidemia, unspecified: Secondary | ICD-10-CM | POA: Diagnosis present

## 2023-02-09 DIAGNOSIS — I4510 Unspecified right bundle-branch block: Secondary | ICD-10-CM | POA: Diagnosis present

## 2023-02-09 DIAGNOSIS — Z1611 Resistance to penicillins: Secondary | ICD-10-CM | POA: Diagnosis present

## 2023-02-09 DIAGNOSIS — Z79899 Other long term (current) drug therapy: Secondary | ICD-10-CM

## 2023-02-09 DIAGNOSIS — Z7901 Long term (current) use of anticoagulants: Secondary | ICD-10-CM | POA: Diagnosis not present

## 2023-02-09 DIAGNOSIS — R7401 Elevation of levels of liver transaminase levels: Secondary | ICD-10-CM | POA: Diagnosis present

## 2023-02-09 DIAGNOSIS — R531 Weakness: Secondary | ICD-10-CM

## 2023-02-09 DIAGNOSIS — Z8546 Personal history of malignant neoplasm of prostate: Secondary | ICD-10-CM

## 2023-02-09 DIAGNOSIS — N3289 Other specified disorders of bladder: Secondary | ICD-10-CM | POA: Diagnosis present

## 2023-02-09 DIAGNOSIS — Z961 Presence of intraocular lens: Secondary | ICD-10-CM | POA: Diagnosis present

## 2023-02-09 DIAGNOSIS — C61 Malignant neoplasm of prostate: Secondary | ICD-10-CM | POA: Insufficient documentation

## 2023-02-09 DIAGNOSIS — I480 Paroxysmal atrial fibrillation: Secondary | ICD-10-CM | POA: Diagnosis present

## 2023-02-09 LAB — CBC WITH DIFFERENTIAL/PLATELET
Abs Immature Granulocytes: 0.02 10*3/uL (ref 0.00–0.07)
Basophils Absolute: 0 10*3/uL (ref 0.0–0.1)
Basophils Relative: 1 %
Eosinophils Absolute: 0.1 10*3/uL (ref 0.0–0.5)
Eosinophils Relative: 2 %
HCT: 44.6 % (ref 39.0–52.0)
Hemoglobin: 14.5 g/dL (ref 13.0–17.0)
Immature Granulocytes: 1 %
Lymphocytes Relative: 9 %
Lymphs Abs: 0.4 10*3/uL — ABNORMAL LOW (ref 0.7–4.0)
MCH: 29.4 pg (ref 26.0–34.0)
MCHC: 32.5 g/dL (ref 30.0–36.0)
MCV: 90.5 fL (ref 80.0–100.0)
Monocytes Absolute: 0 10*3/uL — ABNORMAL LOW (ref 0.1–1.0)
Monocytes Relative: 1 %
Neutro Abs: 3.8 10*3/uL (ref 1.7–7.7)
Neutrophils Relative %: 86 %
Platelets: 139 10*3/uL — ABNORMAL LOW (ref 150–400)
RBC: 4.93 MIL/uL (ref 4.22–5.81)
RDW: 13.9 % (ref 11.5–15.5)
WBC: 4.3 10*3/uL (ref 4.0–10.5)
nRBC: 0 % (ref 0.0–0.2)

## 2023-02-09 LAB — URINALYSIS, W/ REFLEX TO CULTURE (INFECTION SUSPECTED)
Bilirubin Urine: NEGATIVE
Glucose, UA: NEGATIVE mg/dL
Ketones, ur: NEGATIVE mg/dL
Nitrite: NEGATIVE
Protein, ur: NEGATIVE mg/dL
RBC / HPF: >50 RBC/hpf (ref 0–5)
Specific Gravity, Urine: 1.016 (ref 1.005–1.030)
WBC, UA: >50 WBC/hpf (ref 0–5)
pH: 5 (ref 5.0–8.0)

## 2023-02-09 LAB — GLUCOSE, CAPILLARY
Glucose-Capillary: 150 mg/dL — ABNORMAL HIGH (ref 70–99)
Glucose-Capillary: 175 mg/dL — ABNORMAL HIGH (ref 70–99)

## 2023-02-09 LAB — COMPREHENSIVE METABOLIC PANEL
ALT: 64 U/L — ABNORMAL HIGH (ref 0–44)
AST: 61 U/L — ABNORMAL HIGH (ref 15–41)
Albumin: 3.9 g/dL (ref 3.5–5.0)
Alkaline Phosphatase: 80 U/L (ref 38–126)
Anion gap: 11 (ref 5–15)
BUN: 21 mg/dL (ref 8–23)
CO2: 22 mmol/L (ref 22–32)
Calcium: 9 mg/dL (ref 8.9–10.3)
Chloride: 106 mmol/L (ref 98–111)
Creatinine, Ser: 0.83 mg/dL (ref 0.61–1.24)
GFR, Estimated: >60 mL/min (ref >60)
Glucose, Bld: 157 mg/dL — ABNORMAL HIGH (ref 70–99)
Potassium: 4 mmol/L (ref 3.5–5.1)
Sodium: 139 mmol/L (ref 135–145)
Total Bilirubin: 1 mg/dL (ref 0.3–1.2)
Total Protein: 7.5 g/dL (ref 6.5–8.1)

## 2023-02-09 LAB — PROTIME-INR
INR: 1.3 — ABNORMAL HIGH (ref 0.8–1.2)
Prothrombin Time: 16.6 s — ABNORMAL HIGH (ref 11.4–15.2)

## 2023-02-09 LAB — TSH: TSH: 0.431 u[IU]/mL (ref 0.350–4.500)

## 2023-02-09 LAB — LACTIC ACID, PLASMA
Lactic Acid, Venous: 3.2 mmol/L (ref 0.5–1.9)
Lactic Acid, Venous: 2.8 mmol/L (ref 0.5–1.9)

## 2023-02-09 LAB — CK: Total CK: 87 U/L (ref 49–397)

## 2023-02-09 MED ORDER — PANTOPRAZOLE SODIUM 40 MG PO TBEC
40.0000 mg | DELAYED_RELEASE_TABLET | Freq: Every day | ORAL | Status: DC
  Administered 2023-02-10 – 2023-02-11 (×2): 40 mg via ORAL
  Filled 2023-02-09 (×2): qty 1

## 2023-02-09 MED ORDER — ONDANSETRON HCL 4 MG/2ML IJ SOLN
4.0000 mg | Freq: Four times a day (QID) | INTRAMUSCULAR | Status: DC | PRN
  Administered 2023-02-09: 4 mg via INTRAVENOUS
  Filled 2023-02-09: qty 2

## 2023-02-09 MED ORDER — PROMETHAZINE (PHENERGAN) 6.25MG IN NS 50ML IVPB: 6.2500 mg | Freq: Four times a day (QID) | INTRAVENOUS | Status: DC | PRN

## 2023-02-09 MED ORDER — LACTATED RINGERS IV BOLUS (SEPSIS)
1000.0000 mL | Freq: Once | INTRAVENOUS | Status: AC
  Administered 2023-02-09: 1000 mL via INTRAVENOUS

## 2023-02-09 MED ORDER — LORATADINE 10 MG PO TABS
10.0000 mg | ORAL_TABLET | Freq: Every day | ORAL | Status: DC
  Administered 2023-02-10 – 2023-02-11 (×2): 10 mg via ORAL
  Filled 2023-02-09 (×2): qty 1

## 2023-02-09 MED ORDER — OMEGA-3-ACID ETHYL ESTERS 1 G PO CAPS
1.0000 g | ORAL_CAPSULE | Freq: Every day | ORAL | Status: DC
  Administered 2023-02-09 – 2023-02-10 (×2): 1 g via ORAL
  Filled 2023-02-09 (×2): qty 1

## 2023-02-09 MED ORDER — LACTATED RINGERS IV BOLUS
1000.0000 mL | Freq: Once | INTRAVENOUS | Status: AC
  Administered 2023-02-09: 1000 mL via INTRAVENOUS

## 2023-02-09 MED ORDER — SODIUM CHLORIDE 0.9 % IV SOLN
2.0000 g | INTRAVENOUS | Status: DC
  Administered 2023-02-09 – 2023-02-11 (×3): 2 g via INTRAVENOUS
  Filled 2023-02-09 (×3): qty 20

## 2023-02-09 MED ORDER — ADULT MULTIVITAMIN W/MINERALS CH
1.0000 | ORAL_TABLET | Freq: Every day | ORAL | Status: DC
  Administered 2023-02-10 – 2023-02-11 (×2): 1 via ORAL
  Filled 2023-02-09 (×2): qty 1

## 2023-02-09 MED ORDER — ONDANSETRON 4 MG PO TBDP
4.0000 mg | ORAL_TABLET | Freq: Once | ORAL | Status: AC
  Administered 2023-02-09: 4 mg via ORAL
  Filled 2023-02-09: qty 1

## 2023-02-09 MED ORDER — TAMSULOSIN HCL 0.4 MG PO CAPS
0.4000 mg | ORAL_CAPSULE | Freq: Two times a day (BID) | ORAL | Status: DC
  Administered 2023-02-09 – 2023-02-11 (×4): 0.4 mg via ORAL
  Filled 2023-02-09 (×4): qty 1

## 2023-02-09 MED ORDER — VITAMIN D 25 MCG (1000 UNIT) PO TABS
1000.0000 [IU] | ORAL_TABLET | Freq: Every day | ORAL | Status: DC
  Administered 2023-02-10 – 2023-02-11 (×2): 1000 [IU] via ORAL
  Filled 2023-02-09 (×2): qty 1

## 2023-02-09 MED ORDER — BRIMONIDINE TARTRATE 0.2 % OP SOLN
1.0000 [drp] | Freq: Two times a day (BID) | OPHTHALMIC | Status: DC
  Administered 2023-02-09 – 2023-02-11 (×4): 1 [drp] via OPHTHALMIC
  Filled 2023-02-09: qty 5

## 2023-02-09 MED ORDER — INSULIN ASPART 100 UNIT/ML IJ SOLN
0.0000 [IU] | Freq: Three times a day (TID) | INTRAMUSCULAR | Status: DC
  Administered 2023-02-09 – 2023-02-11 (×3): 1 [IU] via SUBCUTANEOUS
  Filled 2023-02-09 (×3): qty 1

## 2023-02-09 MED ORDER — DORZOLAMIDE HCL-TIMOLOL MAL 2-0.5 % OP SOLN
1.0000 [drp] | Freq: Two times a day (BID) | OPHTHALMIC | Status: DC
  Administered 2023-02-09 – 2023-02-11 (×4): 1 [drp] via OPHTHALMIC
  Filled 2023-02-09: qty 10

## 2023-02-09 MED ORDER — DILTIAZEM HCL ER COATED BEADS 180 MG PO CP24
180.0000 mg | ORAL_CAPSULE | ORAL | Status: DC
  Administered 2023-02-09 – 2023-02-10 (×2): 180 mg via ORAL
  Filled 2023-02-09 (×3): qty 1

## 2023-02-09 MED ORDER — MIRABEGRON ER 50 MG PO TB24: 50.0000 mg | ORAL_TABLET | Freq: Two times a day (BID) | ORAL | Status: DC

## 2023-02-09 MED ORDER — LACTATED RINGERS IV SOLN: INTRAVENOUS | Status: AC

## 2023-02-09 MED ORDER — FISH OIL BURP-LESS 1200 MG PO CAPS: 2.0000 | ORAL_CAPSULE | Freq: Every evening | ORAL | Status: DC

## 2023-02-09 NOTE — ED Provider Notes (Signed)
[facility] Provider Note    Event Date/Time   First MD Initiated Contact with Patient 02/09/23 1157     (approximate)   History   Hematuria and Fever   HPI Roberto Leonard is a 82 y.o. male with HTN, DM2, prostate cancer recently finishing radiation and being given Lupron injections presenting today for dysuria and fever.  Patient had been ongoing radiation for several months with last treatment 2 weeks ago.  He is also reportedly had intermittent urinary tract infections over the past 3 months on oral antibiotics several times outpatient most recently on Augmentin finished 3 weeks ago.  He came back in today for worsening pain with urination over the past 2 weeks and bloody urine.  He has had increasing fatigue and is now no longer able to walk any significant distance and has to be rolled around in a wheelchair.  He notices fevers and chills that started today along with bodyaches.  He has had some nausea but no vomiting.  Otherwise denies cough, congestion, shortness of breath, chest pain, abdominal pain.  No recent GU instrumentation within the past 3 months.     Physical Exam   Triage Vital Signs: ED Triage Vitals  Encounter Vitals Group     BP 02/09/23 1139 (!) 124/94     Systolic BP Percentile --      Diastolic BP Percentile --      Pulse Rate 02/09/23 1139 (!) 104     Resp 02/09/23 1139 20     Temp 02/09/23 1139 97.9 F (36.6 C)     Temp Source 02/09/23 1139 Oral     SpO2 02/09/23 1139 97 %     Weight 02/09/23 1140 211 lb (95.7 kg)     Height 02/09/23 1140 6\' 2"  (1.88 m)     Head Circumference --      Peak Flow --      Pain Score 02/09/23 1140 7     Pain Loc --      Pain Education --      Exclude from Growth Chart --     Most recent vital signs: Vitals:   02/09/23 1139 02/09/23 1253  BP: (!) 124/94 (!) 116/91  Pulse: (!) 104 (!) 119  Resp: 20 18  Temp: 97.9 F (36.6 C)   SpO2: 97% 94%   Physical Exam: I have reviewed the vital  signs and nursing notes. General: Awake, alert, no acute distress.  Nontoxic appearing. Head:  Atraumatic, normocephalic.   ENT:  EOM intact, PERRL. Oral mucosa is pink and moist with no lesions. Neck: Neck is supple with full range of motion, No meningeal signs. Cardiovascular:  RRR, No murmurs. Peripheral pulses palpable and equal bilaterally. Respiratory:  Symmetrical chest wall expansion.  No rhonchi, rales, or wheezes.  Good air movement throughout.  No use of accessory muscles.   Musculoskeletal:  No cyanosis or edema. Moving extremities with full ROM Abdomen:  Soft, nontender, nondistended. Neuro:  GCS 15, moving all four extremities, interacting appropriately. Speech clear. Psych:  Calm, appropriate.   Skin:  Warm, dry, no rash.    ED Results / Procedures / Treatments   Labs (all labs ordered are listed, but only abnormal results are displayed) Labs Reviewed  COMPREHENSIVE METABOLIC PANEL - Abnormal; Notable for the following components:      Result Value   Glucose, Bld 157 (*)    AST 61 (*)    ALT 64 (*)    All other  components within normal limits  LACTIC ACID, PLASMA - Abnormal; Notable for the following components:   Lactic Acid, Venous 3.2 (*)    All other components within normal limits  CBC WITH DIFFERENTIAL/PLATELET - Abnormal; Notable for the following components:   Platelets 139 (*)    Lymphs Abs 0.4 (*)    Monocytes Absolute 0.0 (*)    All other components within normal limits  PROTIME-INR - Abnormal; Notable for the following components:   Prothrombin Time 16.6 (*)    INR 1.3 (*)    All other components within normal limits  URINALYSIS, W/ REFLEX TO CULTURE (INFECTION SUSPECTED) - Abnormal; Notable for the following components:   Color, Urine YELLOW (*)    APPearance HAZY (*)    Hgb urine dipstick LARGE (*)    Leukocytes,Ua MODERATE (*)    Bacteria, UA RARE (*)    All other components within normal limits  CULTURE, BLOOD (ROUTINE X 2)  CULTURE, BLOOD  (ROUTINE X 2)  URINE CULTURE  LACTIC ACID, PLASMA     EKG    RADIOLOGY Independently interpreted chest x-ray with no acute pathology   PROCEDURES:  Critical Care performed: Yes, see critical care procedure note(s)  .Critical Care  Performed by: Janith Lima, MD Authorized by: Janith Lima, MD   Critical care provider statement:    Critical care time (minutes):  35   Critical care was necessary to treat or prevent imminent or life-threatening deterioration of the following conditions:  Sepsis   Critical care was time spent personally by me on the following activities:  Development of treatment plan with patient or surrogate, discussions with consultants, evaluation of patient's response to treatment, examination of patient, ordering and review of laboratory studies, ordering and review of radiographic studies, ordering and performing treatments and interventions, pulse oximetry, re-evaluation of patient's condition and review of old charts    MEDICATIONS ORDERED IN ED: Medications  lactated ringers infusion ( Intravenous New Bag/Given 02/09/23 1243)  cefTRIAXone (ROCEPHIN) 2 g in sodium chloride 0.9 % 100 mL IVPB (0 g Intravenous Stopped 02/09/23 1320)  ondansetron (ZOFRAN-ODT) disintegrating tablet 4 mg (4 mg Oral Given 02/09/23 1158)  lactated ringers bolus 1,000 mL (1,000 mLs Intravenous New Bag/Given 02/09/23 1236)    And  lactated ringers bolus 1,000 mL (1,000 mLs Intravenous New Bag/Given 02/09/23 1236)    And  lactated ringers bolus 1,000 mL (1,000 mLs Intravenous New Bag/Given 02/09/23 1236)     IMPRESSION / MDM / ASSESSMENT AND PLAN / ED COURSE  I reviewed the triage vital signs and the nursing notes.                              Differential diagnosis includes, but is not limited to, acute cystitis, sepsis, worsening fatigue secondary to prostate cancer treatment, dehydration.  Patient's presentation is most consistent with acute presentation with potential  threat to life or bodily function.  Patient is an 82 year old male with prostate cancer presenting today for recurrent UTI symptoms now associated with fever and profound weakness and unable to walk.  Vital signs notable for tachycardia.  Physical exam overall unremarkable with no abdominal tenderness.  UA has rare bacteria but has significant WBCs and leukocytes likely consistent with UTI given his symptoms.  CBC and CMP rather unremarkable.  Lactic elevated at 3.2 concerning for sepsis in the setting of tachycardia and tachypnea initially on arrival.  Patient started on ceftriaxone and 30  cc/kg LR bolus.  Patient will be admitted to hospitalist for ongoing treatment of infection as well as physical therapy for regaining strength.  The patient is on the cardiac monitor to evaluate for evidence of arrhythmia and/or significant heart rate changes. Clinical Course as of 02/09/23 1343  Sat Feb 09, 2023  1220 Urinalysis, w/ Reflex to Culture (Infection Suspected) -Urine, Clean Catch(!) Rare bacteria but does have WBCs and leukocytes.  Difficult to differentiate if definite UTI versus irritation from prostate radiation but patient does have painful urination and at this time we will treat as UTI given fevers and tachycardia with concerns for sepsis today. [DW]  1301 CBC with Differential(!) No leukocytosis [DW]  1301 Pulse Rate(!): 119 [DW]  1321 Lactic Acid, Venous(!!): 3.2 [DW]    Clinical Course User Index [DW] Janith Lima, MD     FINAL CLINICAL IMPRESSION(S) / ED DIAGNOSES   Final diagnoses:  Acute cystitis with hematuria  Weakness  Sepsis, due to unspecified organism, unspecified whether acute organ dysfunction present [facility])     Rx / DC Orders   ED Discharge Orders     None        Note:  This document was prepared using Dragon voice recognition software and may include unintentional dictation errors.   Janith Lima, MD 02/09/23 [phone]

## 2023-02-09 NOTE — ED Notes (Signed)
2x unsuccessful attempts made for blood, lab called.

## 2023-02-09 NOTE — Assessment & Plan Note (Signed)
Prostate cancer Continue home tamsulosin On radiotherapy as outpatient Pharmacist has confirmed the patient is not taking mirabegron We will monitor the patient's hematuria and if were to worsen or not resolve following treatment of UTI as well as holding Eliquis will need inpatient urology consultation, otherwise can follow-up as outpatient, this hematuria started today along with the dysuria symptoms, given the fever favor more and infective rather than inflammatory process

## 2023-02-09 NOTE — Assessment & Plan Note (Signed)
  Diabetes mellitus We will hold pioglitazone and sliding scale where the patient has inpatient

## 2023-02-09 NOTE — Assessment & Plan Note (Addendum)
Sepsis, Suspected Secondary to the patient's urinary tract infection, initial lactate acid 3.2 and received IV Crystalloid @ 30 ml/kg in ED Patient Reported Fever Blood culture and urine cultures pending UA positive for WBCs and leukocytes Patient's personal antibiogram notable for Klebsiella pneumonia sensitive to ceftriaxone Plan will be to continue ceftriaxone follow repeat lactic acid follow urine culture and blood culture Will Obtain CT stone study to exclude infected stone or other process that would need intervention

## 2023-02-09 NOTE — ED Notes (Signed)
Advised nurse that patient has ready bed 

## 2023-02-09 NOTE — Assessment & Plan Note (Signed)
Paroxysmal atrial fibrillation We will continue patient's home 180 mg of diltiazem extended release review of past ECG the patient has right bundle branch block Will repeat repeat ECG

## 2023-02-09 NOTE — Assessment & Plan Note (Signed)
Hypertension We will hold the losartan Continue 180 mg of diltiazem extended release as long as he remains hemodynamically stable

## 2023-02-09 NOTE — ED Notes (Signed)
ED TO INPATIENT HANDOFF REPORT  ED Nurse Name and Phone #:  Victorino Dike [phone]  S Name/Age/Gender Roberto Leonard 82 y.o. male Room/Bed: ED53A/ED53A  Code Status   Code Status: Full Code  Home/SNF/Other Home Patient oriented to: self, place, time, and situation Is this baseline? Yes   Triage Complete: Triage complete  Chief Complaint UTI (urinary tract infection) [N39.0]  Triage Note Pt presents to ER with family for fevers, bloody urine, fatigue. Pt finished prostate radiation 2 wks ago   Allergies Allergies  Allergen Reactions   Sulfa Antibiotics Other (See Comments)    SWEATS, bad headache SWEATS, bad headache    Level of Care/Admitting Diagnosis ED Disposition     ED Disposition  Admit   Condition  --   Comment  Hospital Area: [facility] [[id number]]  Level of Care: Med-Surg [16]  Covid Evaluation: Asymptomatic - no recent exposure (last 10 days) testing not required  Diagnosis: UTI (urinary tract infection) [147829]  Admitting Physician: Princess Bruins [[id number]]  Attending Physician: Princess Bruins [[id number]]  Certification:: I certify this patient will need inpatient services for at least 2 midnights  Expected Medical Readiness: 02/12/2023          B Medical/Surgery History Past Medical History:  Diagnosis Date   Allergy    Detached retina    RIGHT   Diabetes mellitus without complication (HCC)    ORAL MED   Dupuytren's contracture    ED (erectile dysfunction)    GERD (gastroesophageal reflux disease)    Glaucoma    Hyperlipidemia    Hypertension    Past Surgical History:  Procedure Laterality Date   CATARACT EXTRACTION     right   COLONOSCOPY WITH PROPOFOL N/A 01/03/2015   Procedure: COLONOSCOPY WITH PROPOFOL;  Surgeon: Midge Minium, MD;  [facility];  Service: Endoscopy;  Laterality: N/A;  DIABETIC-ORAL MEDS   COLONOSCOPY WITH PROPOFOL N/A 10/20/2019   Procedure: COLONOSCOPY WITH PROPOFOL;  Surgeon:  Midge Minium, MD;  Location: [facility];  Service: Endoscopy;  Laterality: N/A;   EYE SURGERY     HERNIA REPAIR     LOOP RECORDER INSERTION N/A 11/15/2016   Procedure: Loop Recorder Insertion;  Surgeon: Duke Salvia, MD;  Location: [facility] INVASIVE CV LAB;  Service: Cardiovascular;  Laterality: N/A;   POLYPECTOMY N/A 01/03/2015   Procedure: POLYPECTOMY;  Surgeon: Midge Minium, MD;  Location: [facility];  Service: Endoscopy;  Laterality: N/A;  Sigmoid polyp   RETINAL DETACHMENT SURGERY     right eye, wrong size lense, secondary surgery to replace it     A IV Location/Drains/Wounds Patient Lines/Drains/Airways Status     Active Line/Drains/Airways     Name Placement date Placement time Site Days   Peripheral IV 02/09/23 20 G Anterior;Right Forearm 02/09/23  1229  Forearm  less than 1            Intake/Output Last 24 hours  Intake/Output Summary (Last 24 hours) at 02/09/2023 1402 Last data filed at 02/09/2023 1320 Gross per 24 hour  Intake 100 ml  Output --  Net 100 ml    Labs/Imaging Results for orders placed or performed during the hospital encounter of 02/09/23 (from the past 48 hour(s))  Urinalysis, w/ Reflex to Culture (Infection Suspected) -Urine, Clean Catch     Status: Abnormal   Collection Time: 02/09/23 11:53 AM  Result Value Ref Range   Specimen Source URINE, CATHETERIZED    Color, Urine YELLOW (A) YELLOW   APPearance  HAZY (A) CLEAR   Specific Gravity, Urine 1.016 1.005 - 1.030   pH 5.0 5.0 - 8.0   Glucose, UA NEGATIVE NEGATIVE mg/dL   Hgb urine dipstick LARGE (A) NEGATIVE   Bilirubin Urine NEGATIVE NEGATIVE   Ketones, ur NEGATIVE NEGATIVE mg/dL   Protein, ur NEGATIVE NEGATIVE mg/dL   Nitrite NEGATIVE NEGATIVE   Leukocytes,Ua MODERATE (A) NEGATIVE   RBC / HPF >50 0 - 5 RBC/hpf   WBC, UA >50 0 - 5 WBC/hpf    Comment:        Reflex urine culture not performed if WBC <=10, OR if Squamous epithelial cells >5. If Squamous epithelial cells  >5 suggest recollection.    Bacteria, UA RARE (A) NONE SEEN   Squamous Epithelial / HPF 0-5 0 - 5 /HPF    Comment: Performed at [facility], [address] [place], [place] [address]  Comprehensive metabolic panel     Status: Abnormal   Collection Time: 02/09/23 12:31 PM  Result Value Ref Range   Sodium 139 135 - 145 mmol/L   Potassium 4.0 3.5 - 5.1 mmol/L   Chloride 106 98 - 111 mmol/L   CO2 22 22 - 32 mmol/L   Glucose, Bld 157 (H) 70 - 99 mg/dL    Comment: Glucose reference range applies only to samples taken after fasting for at least 8 hours.   BUN 21 8 - 23 mg/dL   Creatinine, Ser 6.04 0.61 - 1.24 mg/dL   Calcium 9.0 8.9 - 54.0 mg/dL   Total Protein 7.5 6.5 - 8.1 g/dL   Albumin 3.9 3.5 - 5.0 g/dL   AST 61 (H) 15 - 41 U/L   ALT 64 (H) 0 - 44 U/L   Alkaline Phosphatase 80 38 - 126 U/L   Total Bilirubin 1.0 0.3 - 1.2 mg/dL   GFR, Estimated >98 >11 mL/min    Comment: (NOTE) Calculated using the CKD-EPI Creatinine Equation (2021)    Anion gap 11 5 - 15    Comment: Performed at [facility], [address] [place], [place] [address]  Lactic acid, plasma     Status: Abnormal   Collection Time: 02/09/23 12:31 PM  Result Value Ref Range   Lactic Acid, Venous 3.2 (HH) 0.5 - 1.9 mmol/L    Comment: CRITICAL RESULT CALLED TO, READ BACK BY AND VERIFIED WITH LES WILLOUGHBY AT 1316 02/09/23.PMF Performed at [facility], [address] [place], [place] [address]   CBC with Differential     Status: Abnormal   Collection Time: 02/09/23 12:31 PM  Result Value Ref Range   WBC 4.3 4.0 - 10.5 K/uL   RBC 4.93 4.22 - 5.81 MIL/uL   Hemoglobin 14.5 13.0 - 17.0 g/dL   HCT 13.0 86.5 - 78.4 %   MCV 90.5 80.0 - 100.0 fL   MCH 29.4 26.0 - 34.0 pg   MCHC 32.5 30.0 - 36.0 g/dL   RDW 69.6 29.5 - 28.4 %   Platelets 139 (L) 150 - 400 K/uL   nRBC 0.0 0.0 - 0.2 %   Neutrophils Relative % 86 %   Neutro Abs 3.8 1.7 - 7.7 K/uL   Lymphocytes Relative 9 %   Lymphs Abs  0.4 (L) 0.7 - 4.0 K/uL   Monocytes Relative 1 %   Monocytes Absolute 0.0 (L) 0.1 - 1.0 K/uL   Eosinophils Relative 2 %   Eosinophils Absolute 0.1 0.0 - 0.5 K/uL   Basophils Relative 1 %   Basophils Absolute  0.0 0.0 - 0.1 K/uL   Immature Granulocytes 1 %   Abs Immature Granulocytes 0.02 0.00 - 0.07 K/uL    Comment: Performed at [facility], [address] [place], [place] [address]  Protime-INR     Status: Abnormal   Collection Time: 02/09/23 12:31 PM  Result Value Ref Range   Prothrombin Time 16.6 (H) 11.4 - 15.2 seconds   INR 1.3 (H) 0.8 - 1.2    Comment: (NOTE) INR goal varies based on device and disease states. Performed at [facility], [address] [place], [place] [address]    DG Chest 2 View  Result Date: 02/09/2023 CLINICAL DATA:  Sepsis, fever EXAM: CHEST - 2 VIEW COMPARISON:  04/19/2020 FINDINGS: Coarse bibasilar interstitial markings. No confluent airspace disease or overt edema. Heart size and mediastinal contours are within normal limits. Aortic Atherosclerosis (ICD10-170.0). No effusion. Left chest subcutaneous implanted monitor stable. Regional bones unremarkable. IMPRESSION: Coarse bibasilar interstitial markings without focal airspace disease. Electronically Signed   By: Corlis Leak M.D.   On: 02/09/2023 12:42    Pending Labs Unresulted Labs (From admission, onward)     Start     Ordered   02/10/23 0500  CBC  Tomorrow morning,   STAT        02/09/23 1353   02/10/23 0500  Comprehensive metabolic panel  Tomorrow morning,   STAT        02/09/23 1353   02/09/23 1153  Urine Culture  Once,   R        02/09/23 1153   02/09/23 1142  Lactic acid, plasma  Now then every 2 hours,   STAT      02/09/23 1142   02/09/23 1142  Culture, blood (Routine x 2)  BLOOD CULTURE X 2,   STAT      02/09/23 1142            Vitals/Pain Today's Vitals   02/09/23 1139 02/09/23 1140 02/09/23 1253  BP: (!) 124/94  (!) 116/91  Pulse: (!) 104  (!) 119  Resp:  20  18  Temp: 97.9 F (36.6 C)    TempSrc: Oral    SpO2: 97%  94%  Weight:  95.7 kg   Height:  6\' 2"  (1.88 m)   PainSc:  7      Isolation Precautions No active isolations  Medications Medications  lactated ringers infusion ( Intravenous New Bag/Given 02/09/23 1243)  cefTRIAXone (ROCEPHIN) 2 g in sodium chloride 0.9 % 100 mL IVPB (0 g Intravenous Stopped 02/09/23 1320)  diltiazem (CARDIZEM CD) 24 hr capsule 180 mg (has no administration in time range)  pantoprazole (PROTONIX) EC tablet 40 mg (has no administration in time range)  mirabegron ER (MYRBETRIQ) tablet 50 mg (has no administration in time range)  tamsulosin (FLOMAX) capsule 0.4 mg (has no administration in time range)  cholecalciferol (VITAMIN D3) 25 MCG (1000 UNIT) tablet 1,000 Units (has no administration in time range)  multivitamin with minerals tablet 1 tablet (has no administration in time range)  Fish Oil Burp-Less CAPS 2 capsule (has no administration in time range)  loratadine (CLARITIN) tablet 10 mg (has no administration in time range)  brimonidine (ALPHAGAN) 0.2 % ophthalmic solution 1 drop (has no administration in time range)  dorzolamide-timolol (COSOPT) 2-0.5 % ophthalmic solution 1 drop (has no administration in time range)  ondansetron (ZOFRAN-ODT) disintegrating tablet 4 mg (4 mg Oral Given 02/09/23 1158)  lactated ringers bolus 1,000 mL (1,000 mLs Intravenous New Bag/Given 02/09/23  1236)    And  lactated ringers bolus 1,000 mL (1,000 mLs Intravenous New Bag/Given 02/09/23 1236)    And  lactated ringers bolus 1,000 mL (1,000 mLs Intravenous New Bag/Given 02/09/23 1236)    Mobility walks with person assist     Focused Assessments     R Recommendations: See Admitting Provider Note  Report given to:   Additional Notes:

## 2023-02-09 NOTE — H&P (Addendum)
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  The patient reports that he has had recurrent urinary tract infections over the last few months, upon chart review he was on a cephalosporin on 7/17 and the patient reports also Augmentin.  The patient reports his last radiotherapy was approximately 2 weeks ago as well.  The patient denies any cough soft tissue infections diarrhea abdominal pain or other localizing infective symptoms.  Review of the past urine cultures on August 7 Klebsiella pneumonia sensitive to ceftriaxone. Past medical records reviewed and summarized the patient's primary care visit on 10/26/2022 patient has paroxysmal atrial fibrillation Past medical records reviewed and summarized patient's urology note from 10/24/2022 patient has a Gleason 7 adenocarcinoma PET scan on 10/30/2022 had incidental hepatic steatosis  Review of Systems:  Constitutional: Denies Weight Loss, Reports Subjective Fever, Chills  Eyes: Denies Blurry Vision, Eye Pain or Decreased Vision Respiratory: Denies Shortness of Breath, Cough, Hemoptysis, Wheezing, Pleurisy Cardiovascular: Denies Chest Pain, Paroxsymal Nocturnal Dyspnea, Palpitations, Edema Gastrointestinal: Denies Nausea, Vomiting, Diarrhea, Hematemesis, Melena Genitourinary: Reports  hematuria, dysuria, frequency All other systems were reviewed and are negative   Past Medical History:  Diagnosis Date   Allergy    Detached retina    RIGHT   Diabetes mellitus without complication (HCC)    ORAL MED    Dupuytren's contracture    ED (erectile dysfunction)    GERD (gastroesophageal reflux disease)    Glaucoma    Hyperlipidemia    Hypertension    Past Surgical History:  Procedure Laterality Date   CATARACT EXTRACTION     right   COLONOSCOPY WITH PROPOFOL N/A 01/03/2015   Procedure: COLONOSCOPY WITH PROPOFOL;  Surgeon: Midge Minium, MD;  Location: [facility];  Service: Endoscopy;  Laterality: N/A;  DIABETIC-ORAL MEDS   COLONOSCOPY WITH PROPOFOL N/A 10/20/2019   Procedure: COLONOSCOPY WITH PROPOFOL;  Surgeon: Midge Minium, MD;  Location: [facility];  Service: Endoscopy;  Laterality: N/A;   EYE SURGERY     HERNIA REPAIR     LOOP RECORDER INSERTION N/A 11/15/2016   Procedure: Loop Recorder Insertion;  Surgeon: Duke Salvia, MD;  Location: [facility] INVASIVE CV LAB;  Service: Cardiovascular;  Laterality: N/A;   POLYPECTOMY N/A 01/03/2015   Procedure: POLYPECTOMY;  Surgeon: Midge Minium, MD;  Location: [facility];  Service: Endoscopy;  Laterality: N/A;  Sigmoid polyp   RETINAL DETACHMENT SURGERY     right eye, wrong size lense, secondary surgery to replace it   Social History:  reports that he quit smoking about 59 years ago. His smoking use included cigarettes. He started smoking about 61 years ago. He has a 1 pack-year smoking history. He has been exposed to tobacco smoke. He has never used smokeless tobacco. He reports that he does not drink alcohol and does not use drugs.  Allergies  Allergen Reactions   Sulfa Antibiotics Other (See Comments)    SWEATS, bad headache SWEATS, bad headache    Family History  Problem Relation Age of Onset   Heart disease Mother  Died from CHF   Diabetes Mother    Diabetes Father    Hypertension Father    Heart disease Father        plaque build up   Diabetes Brother    Diabetes Daughter        type 1    Prior to Admission medications   Medication Sig Start Date End Date Taking? Authorizing Provider  apixaban (ELIQUIS) 5  MG TABS tablet Take 5 mg by mouth 2 (two) times daily.   Yes [provider]  brimonidine (ALPHAGAN) 0.2 % ophthalmic solution Place 1 drop into both eyes 2 (two) times daily. 04/11/15  Yes [provider]  Cetirizine HCl 10 MG CAPS Take 10 mg by mouth daily. 03/26/11  Yes [provider]  cholecalciferol (VITAMIN D) 1000 units tablet Take 1,000 Units by mouth daily.   Yes [provider]  diltiazem (CARDIZEM CD) 180 MG 24 hr capsule Take 180 mg by mouth daily.  06/18/19  Yes [provider]  dorzolamide-timolol (COSOPT) 22.3-6.8 MG/ML ophthalmic solution INSTILL 1 DROP INTO BOTH EYES TWICE A DAY 08/21/21  Yes Bosie Clos, MD  losartan (COZAAR) 100 MG tablet Take 100 mg by mouth daily.   Yes [provider]  Multiple Vitamin (MULTIVITAMIN WITH MINERALS) TABS tablet Take 1 tablet by mouth daily.   Yes [provider]  Omega-3 Fatty Acids (FISH OIL BURP-LESS) 1200 MG CAPS Take 2 capsules by mouth every evening.  03/26/11  Yes [provider]  omeprazole (PRILOSEC) 40 MG capsule TAKE 1 CAPSULE BY MOUTH EVERY DAY 01/18/22  Yes Bosie Clos, MD  pioglitazone (ACTOS) 30 MG tablet Take 30 mg by mouth daily.   Yes [provider]  tamsulosin (FLOMAX) 0.4 MG CAPS capsule Take 1 capsule (0.4 mg total) by mouth 2 (two) times daily at 8 am and 10 pm. 01/04/23  Yes Chrystal, Sherrine Maples, MD  cefdinir (OMNICEF) 300 MG capsule Take 1 capsule (300 mg total) by mouth 2 (two) times daily. Patient not taking: Reported on 02/09/2023 11/28/22   Carmina Miller, MD  mirabegron ER (MYRBETRIQ) 50 MG TB24 tablet Take 1 tablet (50 mg total) by mouth in the morning and at bedtime. Patient not taking: Reported on 02/09/2023 01/04/23   Carmina Miller, MD  rosuvastatin (CRESTOR) 20 MG tablet Take 20 mg by mouth daily. Patient not taking: Reported on 02/09/2023 12/14/22 12/14/23  [provider]    Physical Exam: Vitals:   02/09/23 1253  02/09/23 1507 02/09/23 1654 02/09/23 1951  BP: (!) 116/91 (!) 127/58 130/61 (!) 121/52  Pulse: (!) 119 (!) 109  90  Resp: 18 18  17   Temp:  98.4 F (36.9 C)  98.5 F (36.9 C)  TempSrc:  Oral  Oral  SpO2: 94% 95%  95%  Weight:      Height:      Patient seen and examined ER bed 53 at 1:35 PM Constitutional:  Vital Signs as per [facility] than three noted] No Acute Distress Eyes:  Pink Conjunctiva and no Ptosis ENMT:   External Appearance of Ears and Nose without obvious deformity, masses or scar Neck:     Trachea Midline, Neck Symmetric             Thyroid without tenderness, palpable masses or nodules Respiratory:   Respiratory Effort Normal: No Use of Respiratory Muscles,No  Intercostal Retractions             Lungs Clear to Auscultation Bilaterally Cardiovascular:  Heart Auscultated: Regular Regular without any added sounds or murmurs              No Lower Extremity Edema Gastrointestinal:  Abdomen soft , suprapubic tenderness without palpable masses, guarding or rebound  No Palpable Splenomegaly or Hepatomegaly Lymphatic:  No Palpable Cervical Lymphadenopathy or Palpable No Axillary Lymphadenopathy  Psychiatric:  Patient Orientated to Time, Place and Person Patient with appropriate mood and affect Recent and Remote Memory Intact   Data Reviewed: Labs as per A/P  Assessment and Plan: Sepsis (HCC) Sepsis, Suspected Secondary to the patient's urinary tract infection, initial lactate acid 3.2 and received IV Crystalloid @ 30 ml/kg in ED Patient Reported Fever Blood culture and urine cultures pending UA positive for WBCs and leukocytes Patient's personal antibiogram notable for Klebsiella pneumonia sensitive to ceftriaxone Plan will be to continue ceftriaxone follow repeat lactic acid follow urine culture and blood culture Will Obtain CT stone study to exclude infected stone or other process that would need intervention  Secondary hypercoagulability disorder  (HCC) Secondary hypercoagulable state Both due to the patient's malignancy as well as paroxysmal atrial fibrillation In the setting of new onset significant gross hematuria we will hold anticoagulation today but will need close interval reevaluation given his multiple thrombotic possibilities, underwent shared decision making with the patient   Prostate cancer (HCC) Prostate cancer Continue home tamsulosin On radiotherapy as outpatient Pharmacist has confirmed the patient is not taking mirabegron We will monitor the patient's hematuria and if were to worsen or not resolve following treatment of UTI as well as holding Eliquis will need inpatient urology consultation, otherwise can follow-up as outpatient, this hematuria started today along with the dysuria symptoms, given the fever favor more and infective rather than inflammatory process  Diabetes mellitus (HCC)  Diabetes mellitus We will hold pioglitazone and sliding scale where the patient has inpatient  Paroxysmal atrial fibrillation (HCC) Paroxysmal atrial fibrillation We will continue patient's home 180 mg of diltiazem extended release review of past ECG the patient has right bundle branch block Will repeat repeat ECG  Hyperlipidemia   Hyperlipidemia Patient reports lots of muscle pain since starting a statin we will hold and obtain CPK as well as a TSH  Glaucoma Glaucoma Currently no symptoms Continue home drops   Essential hypertension Hypertension We will hold the losartan Continue 180 mg of diltiazem extended release as long as he remains hemodynamically stable  Transaminitis  Transaminitis Suspect this is likely secondary to the hepatic steatosis noted on previous PET scan Will obtain a right upper quadrant u/s, acute hepatitis panel and repeat CMP in the a.m. to exclude this is a source of infection      Advance Care Planning:   Code Status: Full Code   Patient reports he has a living will which he is  going to bring in however he was clear he would like resuscitation with intubation and defibrillation in the case of cardiac arrest however he would not want prolonged long-term mechanical ventilation.  Consults: N/A  Family Communication: 2 Daughters at the bedside   Severity of Illness: The appropriate patient status for this patient is INPATIENT. Inpatient status is judged to be reasonable and necessary in order to provide the required intensity of service to ensure the patient's safety. The patient's presenting symptoms, physical exam findings, and initial radiographic and laboratory data in the context of their chronic comorbidities is felt to place them at high risk for further clinical deterioration. Furthermore, it is not anticipated that the patient  will be medically stable for discharge from the hospital within 2 midnights of admission.   * I certify that at the point of admission it is my clinical judgment that the patient will require inpatient hospital care spanning beyond 2 midnights from the point of admission due to high intensity of service, high risk for further deterioration and high frequency of surveillance required.*  Author: Princess Bruins, MD 02/09/2023 7:59 PM  For on call review [email or web address].

## 2023-02-09 NOTE — Progress Notes (Signed)
CODE SEPSIS - PHARMACY COMMUNICATION  **Broad Spectrum Antibiotics should be administered within 1 hour of Sepsis diagnosis**  Time Code Sepsis Called/Page Received: 1229  Antibiotics Ordered: Ceftriaxone  Time of 1st antibiotic administration: 1236  Additional action taken by pharmacy: N/A   Roberto Leonard 02/09/2023  12:57 PM

## 2023-02-09 NOTE — Assessment & Plan Note (Signed)
Secondary hypercoagulable state Both due to the patient's malignancy as well as paroxysmal atrial fibrillation In the setting of new onset significant gross hematuria we will hold anticoagulation today but will need close interval reevaluation given his multiple thrombotic possibilities, underwent shared decision making with the patient

## 2023-02-09 NOTE — ED Triage Notes (Addendum)
Pt presents to ER with family for fevers, bloody urine, fatigue. Pt finished prostate radiation 2 wks ago

## 2023-02-09 NOTE — Assessment & Plan Note (Signed)
   Hyperlipidemia Patient reports lots of muscle pain since starting a statin we will hold and obtain CPK as well as a TSH

## 2023-02-09 NOTE — Assessment & Plan Note (Signed)
  Transaminitis Suspect this is likely secondary to the hepatic steatosis noted on previous PET scan Will obtain a right upper quadrant u/s, acute hepatitis panel and repeat CMP in the a.m. to exclude this is a source of infection

## 2023-02-09 NOTE — Assessment & Plan Note (Signed)
Glaucoma Currently no symptoms Continue home drops

## 2023-02-10 ENCOUNTER — Encounter: Payer: Self-pay | Admitting: Internal Medicine

## 2023-02-10 ENCOUNTER — Inpatient Hospital Stay: Payer: Medicare PPO

## 2023-02-10 DIAGNOSIS — B961 Klebsiella pneumoniae [K. pneumoniae] as the cause of diseases classified elsewhere: Secondary | ICD-10-CM | POA: Diagnosis present

## 2023-02-10 DIAGNOSIS — R6520 Severe sepsis without septic shock: Secondary | ICD-10-CM

## 2023-02-10 DIAGNOSIS — A419 Sepsis, unspecified organism: Secondary | ICD-10-CM | POA: Diagnosis not present

## 2023-02-10 LAB — BLOOD CULTURE ID PANEL (REFLEXED) - BCID2

## 2023-02-10 LAB — GLUCOSE, CAPILLARY
Glucose-Capillary: 143 mg/dL — ABNORMAL HIGH (ref 70–99)
Glucose-Capillary: 171 mg/dL — ABNORMAL HIGH (ref 70–99)
Glucose-Capillary: 105 mg/dL — ABNORMAL HIGH (ref 70–99)
Glucose-Capillary: 144 mg/dL — ABNORMAL HIGH (ref 70–99)

## 2023-02-10 LAB — CBC
HCT: 34.8 % — ABNORMAL LOW (ref 39.0–52.0)
Hemoglobin: 11.7 g/dL — ABNORMAL LOW (ref 13.0–17.0)
MCH: 29.5 pg (ref 26.0–34.0)
MCHC: 33.6 g/dL (ref 30.0–36.0)
MCV: 87.9 fL (ref 80.0–100.0)
Platelets: 104 10*3/uL — ABNORMAL LOW (ref 150–400)
RBC: 3.96 MIL/uL — ABNORMAL LOW (ref 4.22–5.81)
RDW: 14.1 % (ref 11.5–15.5)
WBC: 10.9 10*3/uL — ABNORMAL HIGH (ref 4.0–10.5)
nRBC: 0 % (ref 0.0–0.2)

## 2023-02-10 LAB — COMPREHENSIVE METABOLIC PANEL
ALT: 49 U/L — ABNORMAL HIGH (ref 0–44)
AST: 46 U/L — ABNORMAL HIGH (ref 15–41)
Albumin: 2.7 g/dL — ABNORMAL LOW (ref 3.5–5.0)
Alkaline Phosphatase: 42 U/L (ref 38–126)
Anion gap: 7 (ref 5–15)
BUN: 18 mg/dL (ref 8–23)
CO2: 25 mmol/L (ref 22–32)
Calcium: 8.2 mg/dL — ABNORMAL LOW (ref 8.9–10.3)
Chloride: 106 mmol/L (ref 98–111)
Creatinine, Ser: 0.81 mg/dL (ref 0.61–1.24)
GFR, Estimated: >60 mL/min (ref >60)
Glucose, Bld: 121 mg/dL — ABNORMAL HIGH (ref 70–99)
Potassium: 3.8 mmol/L (ref 3.5–5.1)
Sodium: 138 mmol/L (ref 135–145)
Total Bilirubin: 0.7 mg/dL (ref 0.3–1.2)
Total Protein: 5.4 g/dL — ABNORMAL LOW (ref 6.5–8.1)

## 2023-02-10 LAB — HEMOGLOBIN A1C
Hgb A1c MFr Bld: 6.7 % — ABNORMAL HIGH (ref 4.8–5.6)
Mean Plasma Glucose: 145.59 mg/dL

## 2023-02-10 LAB — HEPATITIS PANEL, ACUTE
HCV Ab: NONREACTIVE
Hep A IgM: NONREACTIVE
Hep B C IgM: NONREACTIVE
Hepatitis B Surface Ag: NONREACTIVE

## 2023-02-10 LAB — MAGNESIUM: Magnesium: 1.4 mg/dL — ABNORMAL LOW (ref 1.7–2.4)

## 2023-02-10 MED ORDER — APIXABAN 5 MG PO TABS
5.0000 mg | ORAL_TABLET | Freq: Two times a day (BID) | ORAL | Status: DC
  Administered 2023-02-10 – 2023-02-11 (×3): 5 mg via ORAL
  Filled 2023-02-10 (×3): qty 1

## 2023-02-10 MED ORDER — OXYBUTYNIN CHLORIDE 5 MG PO TABS
5.0000 mg | ORAL_TABLET | Freq: Two times a day (BID) | ORAL | Status: DC
  Administered 2023-02-10 – 2023-02-11 (×3): 5 mg via ORAL
  Filled 2023-02-10 (×3): qty 1

## 2023-02-10 MED ORDER — MAGNESIUM SULFATE 2 GM/50ML IV SOLN
2.0000 g | Freq: Once | INTRAVENOUS | Status: AC
  Administered 2023-02-10: 2 g via INTRAVENOUS
  Filled 2023-02-10: qty 50

## 2023-02-10 NOTE — Progress Notes (Addendum)
PHARMACY - PHYSICIAN COMMUNICATION CRITICAL VALUE ALERT - BLOOD CULTURE IDENTIFICATION (BCID)  Roberto Leonard is an 82 y.o. male who presented to [facility] on 02/09/2023 with a chief complaint of urinary tract infection. Presented with recurrent UTI symptoms associated with fever and profound weakness. UA consistent with UTI given symptoms.   Assessment:  2/4 bottles positive for enterobacteraels, K. pneumoniae. No resistance detected by BCID. Suspected source is urinary.  Name of physician (or Provider) Contacted: Dr. Lurene Shadow  Current antibiotics: Ceftriaxone IV 2 g daily   Changes to prescribed antibiotics recommended:  Patient is on recommended antibiotics - No changes needed  Results for orders placed or performed during the hospital encounter of 02/09/23  Blood Culture ID Panel (Reflexed) (Collected: 02/09/2023 12:31 PM)  Result Value Ref Range   Enterococcus faecalis NOT DETECTED NOT DETECTED   Enterococcus Faecium NOT DETECTED NOT DETECTED   Listeria monocytogenes NOT DETECTED NOT DETECTED   Staphylococcus species NOT DETECTED NOT DETECTED   Staphylococcus aureus (BCID) NOT DETECTED NOT DETECTED   Staphylococcus epidermidis NOT DETECTED NOT DETECTED   Staphylococcus lugdunensis NOT DETECTED NOT DETECTED   Streptococcus species NOT DETECTED NOT DETECTED   Streptococcus agalactiae NOT DETECTED NOT DETECTED   Streptococcus pneumoniae NOT DETECTED NOT DETECTED   Streptococcus pyogenes NOT DETECTED NOT DETECTED   A.calcoaceticus-baumannii NOT DETECTED NOT DETECTED   Bacteroides fragilis NOT DETECTED NOT DETECTED   Enterobacterales DETECTED (A) NOT DETECTED   Enterobacter cloacae complex NOT DETECTED NOT DETECTED   Escherichia coli NOT DETECTED NOT DETECTED   Klebsiella aerogenes NOT DETECTED NOT DETECTED   Klebsiella oxytoca NOT DETECTED NOT DETECTED   Klebsiella pneumoniae DETECTED (A) NOT DETECTED   Proteus species NOT DETECTED NOT DETECTED   Salmonella species NOT  DETECTED NOT DETECTED   Serratia marcescens NOT DETECTED NOT DETECTED   Haemophilus influenzae NOT DETECTED NOT DETECTED   Neisseria meningitidis NOT DETECTED NOT DETECTED   Pseudomonas aeruginosa NOT DETECTED NOT DETECTED   Stenotrophomonas maltophilia NOT DETECTED NOT DETECTED   Candida albicans NOT DETECTED NOT DETECTED   Candida auris NOT DETECTED NOT DETECTED   Candida glabrata NOT DETECTED NOT DETECTED   Candida krusei NOT DETECTED NOT DETECTED   Candida parapsilosis NOT DETECTED NOT DETECTED   Candida tropicalis NOT DETECTED NOT DETECTED   Cryptococcus neoformans/gattii NOT DETECTED NOT DETECTED   CTX-M ESBL NOT DETECTED NOT DETECTED   Carbapenem resistance IMP NOT DETECTED NOT DETECTED   Carbapenem resistance KPC NOT DETECTED NOT DETECTED   Carbapenem resistance NDM NOT DETECTED NOT DETECTED   Carbapenem resist OXA 48 LIKE NOT DETECTED NOT DETECTED   Carbapenem resistance VIM NOT DETECTED NOT DETECTED    Effie Shy PGY1 Pharmacy Resident 02/10/2023  11:22 AM

## 2023-02-10 NOTE — Progress Notes (Addendum)
Progress Note    Roberto Leonard  KVQ:[id number] DOB: Sep 19, 1940  DOA: 02/09/2023 PCP: Bosie Clos, MD      Brief Narrative:    Medical records reviewed and are as summarized below:  Roberto Leonard is a 82 y.o. male with medical history significant for paroxysmal atrial fibrillation on Eliquis, RBBB, syncope, GERD, type II DM, hypertension, left eye pseudophakia, hyperlipidemia, prostate cancer on radiation therapy (completed 2 weeks ago) recent UTI that was treated with Augmentin (urine culture from 12/29/2022).  He presented to the hospital because of fatigue, bloody urine and subjective fever.   He was found to have sepsis secondary to acute UTI.  He was treated with empiric IV antibiotics and IV fluids.      Assessment/Plan:   Principal Problem:   Severe sepsis (HCC) Active Problems:   Transaminitis   Essential hypertension   Glaucoma   Hyperlipidemia   Paroxysmal atrial fibrillation (HCC)   Diabetes mellitus (HCC)   UTI (urinary tract infection)   Prostate cancer (HCC)   Secondary hypercoagulability disorder (HCC)   Bacteremia due to Klebsiella pneumoniae    Body mass index is 27.09 kg/m.   Severe sepsis secondary to Klebsiella UTI and bacteremia: Continue IV ceftriaxone.  Follow-up urine and blood culture sensitivity. Of note, patient had recent UTI and urine culture from 12/29/2022 showed Klebsiella pneumoniae.  He had completed a course of Augmentin   Hematuria: Resolved.  Hematuria was probably from UTI. Bladder spasms: Start oxybutynin.   Paroxysmal atrial fibrillation: Continue Eliquis and diltiazem   Hypomagnesemia: Replete magnesium with IV magnesium sulfate.   Type II DM: NovoLog as needed for hyperglycemia.   Elevated liver enzymes: Probably from fatty liver.  Monitor liver enzymes.   Prostate cancer: S/p radiation therapy.   Other comorbidities include glaucoma, hyperlipidemia, hypertension, nonobstructing 1 mm right  renal stone   Diet Order             Diet Carb Modified Fluid consistency: Thin; Room service appropriate? Yes  Diet effective now                            Consultants: None  Procedures: None    Medications:    apixaban  5 mg Oral BID   brimonidine  1 drop Both Eyes BID   cholecalciferol  1,000 Units Oral Daily   diltiazem  180 mg Oral Q24H   dorzolamide-timolol  1 drop Both Eyes BID   insulin aspart  0-9 Units Subcutaneous TID WC   loratadine  10 mg Oral Daily   multivitamin with minerals  1 tablet Oral Daily   omega-3 acid ethyl esters  1 g Oral QHS   oxybutynin  5 mg Oral BID   pantoprazole  40 mg Oral Daily   tamsulosin  0.4 mg Oral BID AC & HS   Continuous Infusions:  cefTRIAXone (ROCEPHIN)  IV 2 g (02/10/23 1134)   promethazine (PHENERGAN) injection (IM or IVPB)       Anti-infectives (From admission, onward)    Start     Dose/Rate Route Frequency Ordered Stop   02/09/23 1245  cefTRIAXone (ROCEPHIN) 2 g in sodium chloride 0.9 % 100 mL IVPB        2 g 200 mL/hr over 30 Minutes Intravenous Every 24 hours 02/09/23 1230 02/16/23 1244              Family Communication/Anticipated D/C date and plan/Code Status  DVT prophylaxis: SCDs Start: 02/09/23 1353 apixaban (ELIQUIS) tablet 5 mg     Code Status: Full Code  Family Communication: Plan discussed with his wife and daughter at the bedside Disposition Plan: Plan to discharge home   Status is: Inpatient Remains inpatient appropriate because: Sepsis from UTI       Subjective:   Interval events noted.  He had bloody urine yesterday but this has resolved.  His urine was clear this morning.  He still has some burning urination.  He complains of frequent urination and bladder spasms and requested medication for this.  No other complaints.  No palpitations, chest pain, shortness of breath, vomiting, abdominal pain.  His wife and daughter, Roberto Leonard, were at the bedside.  Objective:     Vitals:   02/09/23 1654 02/09/23 1951 02/10/23 0549 02/10/23 0740  BP: 130/61 (!) 121/52 (!) 119/46 (!) 121/54  Pulse:  90 81 72  Resp:  17 20 12   Temp:  98.5 F (36.9 C) 97.6 F (36.4 C) 99.4 F (37.4 C)  TempSrc:  Oral  Oral  SpO2:  95% 92% 93%  Weight:      Height:       No data found.   Intake/Output Summary (Last 24 hours) at 02/10/2023 1432 Last data filed at 02/10/2023 1235 Gross per 24 hour  Intake 1233 ml  Output 850 ml  Net 383 ml   Filed Weights   02/09/23 1140  Weight: 95.7 kg    Exam:  GEN: NAD SKIN: Warm and dry EYES: No pallor or icterus ENT: MMM CV: Irregular rate and rhythm PULM: CTA B ABD: soft, ND, NT, +BS CNS: AAO x 3, non focal EXT: No edema or tenderness        Data Reviewed:   I have personally reviewed following labs and imaging studies:  Labs: Labs show the following:   Basic Metabolic Panel: Recent Labs  Lab 02/09/23 1231 02/10/23 0412  NA 139 138  K 4.0 3.8  CL 106 106  CO2 22 25  GLUCOSE 157* 121*  BUN 21 18  CREATININE 0.83 0.81  CALCIUM 9.0 8.2*   GFR Estimated Creatinine Clearance: 80.3 mL/min (by C-G formula based on SCr of 0.81 mg/dL). Liver Function Tests: Recent Labs  Lab 02/09/23 1231 02/10/23 0412  AST 61* 46*  ALT 64* 49*  ALKPHOS 80 42  BILITOT 1.0 0.7  PROT 7.5 5.4*  ALBUMIN 3.9 2.7*   No results for input(s): "LIPASE", "AMYLASE" in the last 168 hours. No results for input(s): "AMMONIA" in the last 168 hours. Coagulation profile Recent Labs  Lab 02/09/23 1231  INR 1.3*    CBC: Recent Labs  Lab 02/09/23 1231 02/10/23 0412  WBC 4.3 10.9*  NEUTROABS 3.8  --   HGB 14.5 11.7*  HCT 44.6 34.8*  MCV 90.5 87.9  PLT 139* 104*   Cardiac Enzymes: Recent Labs  Lab 02/09/23 1628  CKTOTAL 87   BNP (last 3 results) No results for input(s): "PROBNP" in the last 8760 hours. CBG: Recent Labs  Lab 02/09/23 1638 02/09/23 2116 02/10/23 0956 02/10/23 1138  GLUCAP 150* 175* 143*  171*   D-Dimer: No results for input(s): "DDIMER" in the last 72 hours. Hgb A1c: Recent Labs    02/09/23 1628  HGBA1C 6.7*   Lipid Profile: No results for input(s): "CHOL", "HDL", "LDLCALC", "TRIG", "CHOLHDL", "LDLDIRECT" in the last 72 hours. Thyroid function studies: Recent Labs    02/09/23 1628  TSH 0.431   Anemia work up: No  results for input(s): "VITAMINB12", "FOLATE", "FERRITIN", "TIBC", "IRON", "RETICCTPCT" in the last 72 hours. Sepsis Labs: Recent Labs  Lab 02/09/23 1231 02/09/23 1348 02/10/23 0412  WBC 4.3  --  10.9*  LATICACIDVEN 3.2* 2.8*  --     Microbiology Recent Results (from the past 240 hour(s))  Urine Culture     Status: Abnormal (Preliminary result)   Collection Time: 02/09/23 11:53 AM   Specimen: Urine, Random  Result Value Ref Range Status   Specimen Description   Final    URINE, RANDOM Performed at [facility], [address], [place], [place] [address]    Special Requests   Final    NONE Reflexed from [phone] Performed at [facility], [address] [place], [place] [address]    Culture >=100,000 COLONIES/mL GRAM NEGATIVE RODS (A)  Final   Report Status PENDING  Incomplete  Culture, blood (Routine x 2)     Status: None (Preliminary result)   Collection Time: 02/09/23 12:31 PM   Specimen: BLOOD RIGHT ARM  Result Value Ref Range Status   Specimen Description   Final    BLOOD RIGHT ARM Performed at [facility], [address] [place], [place] [address]    Special Requests   Final    BOTTLES DRAWN AEROBIC AND ANAEROBIC Blood Culture results may not be optimal due to an inadequate volume of blood received in culture bottles   Culture  Setup Time   Final    GRAM NEGATIVE RODS ANAEROBIC BOTTLE ONLY CRITICAL RESULT CALLED TO, READ BACK BY AND VERIFIED WITH: Roberto Leonard AT 0940 02/10/23.PMF Performed at [facility], [address] [place], [place] [address]    Culture GRAM NEGATIVE RODS  Final    Report Status PENDING  Incomplete  Culture, blood (Routine x 2)     Status: None (Preliminary result)   Collection Time: 02/09/23 12:31 PM   Specimen: BLOOD RIGHT HAND  Result Value Ref Range Status   Specimen Description   Final    BLOOD RIGHT HAND Performed at [facility], [address] [place], [place] [address]    Special Requests   Final    BOTTLES DRAWN AEROBIC AND ANAEROBIC Blood Culture results may not be optimal due to an inadequate volume of blood received in culture bottles   Culture  Setup Time   Final    Organism ID to follow GRAM NEGATIVE RODS ANAEROBIC BOTTLE ONLY CRITICAL RESULT CALLED TO, READ BACK BY AND VERIFIED WITH: Roberto Leonard AT 0940 02/10/23.PMF Performed at [facility], [address] [place], [place] [address]    Culture GRAM NEGATIVE RODS  Final   Report Status PENDING  Incomplete  Blood Culture ID Panel (Reflexed)     Status: Abnormal   Collection Time: 02/09/23 12:31 PM  Result Value Ref Range Status   Enterococcus faecalis NOT DETECTED NOT DETECTED Final   Enterococcus Faecium NOT DETECTED NOT DETECTED Final   Listeria monocytogenes NOT DETECTED NOT DETECTED Final   Staphylococcus species NOT DETECTED NOT DETECTED Final   Staphylococcus aureus (BCID) NOT DETECTED NOT DETECTED Final   Staphylococcus epidermidis NOT DETECTED NOT DETECTED Final   Staphylococcus lugdunensis NOT DETECTED NOT DETECTED Final   Streptococcus species NOT DETECTED NOT DETECTED Final   Streptococcus agalactiae NOT DETECTED NOT DETECTED Final   Streptococcus pneumoniae NOT DETECTED NOT DETECTED Final   Streptococcus pyogenes NOT DETECTED NOT DETECTED Final   A.calcoaceticus-baumannii NOT DETECTED NOT DETECTED Final   Bacteroides fragilis  NOT DETECTED NOT DETECTED Final   Enterobacterales DETECTED (A) NOT DETECTED Final    Comment: Enterobacterales represent a large order of gram negative bacteria, not a single organism. CRITICAL RESULT CALLED TO, READ BACK  BY AND VERIFIED WITH: Roberto Leonard AT 0940 02/10/23.PMF    Enterobacter cloacae complex NOT DETECTED NOT DETECTED Final   Escherichia coli NOT DETECTED NOT DETECTED Final   Klebsiella aerogenes NOT DETECTED NOT DETECTED Final   Klebsiella oxytoca NOT DETECTED NOT DETECTED Final   Klebsiella pneumoniae DETECTED (A) NOT DETECTED Final    Comment: CRITICAL RESULT CALLED TO, READ BACK BY AND VERIFIED WITH: Roberto Leonard AT 0940 02/10/23.PMF    Proteus species NOT DETECTED NOT DETECTED Final   Salmonella species NOT DETECTED NOT DETECTED Final   Serratia marcescens NOT DETECTED NOT DETECTED Final   Haemophilus influenzae NOT DETECTED NOT DETECTED Final   Neisseria meningitidis NOT DETECTED NOT DETECTED Final   Pseudomonas aeruginosa NOT DETECTED NOT DETECTED Final   Stenotrophomonas maltophilia NOT DETECTED NOT DETECTED Final   Candida albicans NOT DETECTED NOT DETECTED Final   Candida auris NOT DETECTED NOT DETECTED Final   Candida glabrata NOT DETECTED NOT DETECTED Final   Candida krusei NOT DETECTED NOT DETECTED Final   Candida parapsilosis NOT DETECTED NOT DETECTED Final   Candida tropicalis NOT DETECTED NOT DETECTED Final   Cryptococcus neoformans/gattii NOT DETECTED NOT DETECTED Final   CTX-M ESBL NOT DETECTED NOT DETECTED Final   Carbapenem resistance IMP NOT DETECTED NOT DETECTED Final   Carbapenem resistance KPC NOT DETECTED NOT DETECTED Final   Carbapenem resistance NDM NOT DETECTED NOT DETECTED Final   Carbapenem resist OXA 48 LIKE NOT DETECTED NOT DETECTED Final   Carbapenem resistance VIM NOT DETECTED NOT DETECTED Final    Comment: Performed at [facility], [address] [place], [place] [address]    Procedures and diagnostic studies:  US Abdomen Limited RUQ (LIVER/GB)  Result Date: 02/10/2023 CLINICAL DATA:  Transaminitis EXAM: ULTRASOUND ABDOMEN LIMITED RIGHT UPPER QUADRANT COMPARISON:  CT 02/09/2023 FINDINGS: Gallbladder: No gallstones or wall thickening  visualized. No sonographic Murphy sign noted by sonographer. Common bile duct: Diameter: Normal at 4 mm Liver: Uniform increase in hepatic echogenicity. No duct dilatation. No focal lesion. Portal vein is patent on color Doppler imaging with normal direction of blood flow towards the liver. Other: No free fluid IMPRESSION: 1. Uniform increase in hepatic echogenicity suggests hepatic steatosis. 2. No biliary abnormality. Electronically Signed   By: Genevive Bi M.D.   On: 02/10/2023 12:39   CT RENAL STONE STUDY  Result Date: 02/09/2023 CLINICAL DATA:  Abdominal/flank pain. Urinary tract stone disease suspected. Prostate cancer. EXAM: CT ABDOMEN AND PELVIS WITHOUT CONTRAST TECHNIQUE: Multidetector CT imaging of the abdomen and pelvis was performed following the standard protocol without IV contrast. RADIATION DOSE REDUCTION: This exam was performed according to the departmental dose-optimization program which includes automated exposure control, adjustment of the mA and/or kV according to patient size and/or use of iterative reconstruction technique. COMPARISON:  PET scan 10/30/2022 FINDINGS: Lower chest: Scarring at the lung bases similar to the PET scan in June of this year. Hepatobiliary: Fatty change of the liver. No focal lesion. No calcified gallstones. Pancreas: Normal Spleen: Normal Adrenals/Urinary Tract: Adrenal glands are normal. Multiple subpleural appearing renal cysts that do not require further follow-up. There is a nonobstructing 1 mm stone in the upper pole the right kidney. No other sign of any stones in the kidneys. No hydroureteronephrosis. No stone in the bladder.  Stomach/Bowel: Stomach and small intestine are normal. Normal appendix. No significant: Finding. Vascular/Lymphatic: Aortic atherosclerosis. No aneurysm. IVC is normal. No adenopathy. Reproductive: Normal. Normal appearance of the prostate gland by CT. Other: No free fluid or air. Left inguinal hernia containing only fat.  Previous hernia repair on the right. Musculoskeletal: Chronic lower lumbar degenerative changes. IMPRESSION: 1. No acute finding to explain the clinical presentation. No evidence of significant urinary tract stone disease or obstruction. 1 mm nonobstructing stone in the upper pole of the right kidney. 2. Fatty change of the liver. 3. Aortic atherosclerosis. 4. Left inguinal hernia containing only fat. Previous right inguinal hernia repair. Aortic Atherosclerosis (ICD10-I70.0). Electronically Signed   By: Paulina Fusi M.D.   On: 02/09/2023 20:37   DG Chest 2 View  Result Date: 02/09/2023 CLINICAL DATA:  Sepsis, fever EXAM: CHEST - 2 VIEW COMPARISON:  04/19/2020 FINDINGS: Coarse bibasilar interstitial markings. No confluent airspace disease or overt edema. Heart size and mediastinal contours are within normal limits. Aortic Atherosclerosis (ICD10-170.0). No effusion. Left chest subcutaneous implanted monitor stable. Regional bones unremarkable. IMPRESSION: Coarse bibasilar interstitial markings without focal airspace disease. Electronically Signed   By: Corlis Leak M.D.   On: 02/09/2023 12:42               LOS: 1 day   Demarcus Thielke  [facility]   Pager on [email or web address]. If 7PM-7AM, please contact night-coverage at [email or web address]     02/10/2023, 2:32 PM

## 2023-02-10 NOTE — TOC CM/SW Note (Signed)
Transition of Care [facility]) - Inpatient Brief Assessment   Patient Details  Name: Roberto Leonard MRN: [id number] Date of Birth: [birth year]  Transition of Care [facility]) CM/SW Contact:    Kemper Durie, RN Phone Number: 02/10/2023, 12:03 PM   Clinical Narrative:  Spoke with patient, brief assessment done.  Patient denies any TOC needs at this time.  Transition of Care Asessment: Insurance and Status: Insurance coverage has been reviewed Patient has primary care physician: Yes Home environment has been reviewed: With wife Prior level of function:: Independent Prior/Current Home Services: No current home services Social Determinants of Health Reivew: SDOH reviewed no interventions necessary Readmission risk has been reviewed: Yes Transition of care needs: no transition of care needs at this time

## 2023-02-11 DIAGNOSIS — A419 Sepsis, unspecified organism: Secondary | ICD-10-CM | POA: Diagnosis not present

## 2023-02-11 DIAGNOSIS — R6520 Severe sepsis without septic shock: Secondary | ICD-10-CM | POA: Diagnosis not present

## 2023-02-11 LAB — CBC WITH DIFFERENTIAL/PLATELET
Abs Immature Granulocytes: 0.04 10*3/uL (ref 0.00–0.07)
Basophils Absolute: 0 10*3/uL (ref 0.0–0.1)
Basophils Relative: 1 %
Eosinophils Absolute: 0.2 10*3/uL (ref 0.0–0.5)
Eosinophils Relative: 3 %
HCT: 34.6 % — ABNORMAL LOW (ref 39.0–52.0)
Hemoglobin: 11.6 g/dL — ABNORMAL LOW (ref 13.0–17.0)
Immature Granulocytes: 1 %
Lymphocytes Relative: 14 %
Lymphs Abs: 0.9 10*3/uL (ref 0.7–4.0)
MCH: 29.4 pg (ref 26.0–34.0)
MCHC: 33.5 g/dL (ref 30.0–36.0)
MCV: 87.6 fL (ref 80.0–100.0)
Monocytes Absolute: 0.7 10*3/uL (ref 0.1–1.0)
Monocytes Relative: 11 %
Neutro Abs: 4.8 10*3/uL (ref 1.7–7.7)
Neutrophils Relative %: 70 %
Platelets: 104 10*3/uL — ABNORMAL LOW (ref 150–400)
RBC: 3.95 MIL/uL — ABNORMAL LOW (ref 4.22–5.81)
RDW: 14 % (ref 11.5–15.5)
WBC: 6.8 10*3/uL (ref 4.0–10.5)
nRBC: 0 % (ref 0.0–0.2)

## 2023-02-11 LAB — GLUCOSE, CAPILLARY
Glucose-Capillary: 150 mg/dL — ABNORMAL HIGH (ref 70–99)
Glucose-Capillary: 119 mg/dL — ABNORMAL HIGH (ref 70–99)

## 2023-02-11 LAB — COMPREHENSIVE METABOLIC PANEL
ALT: 48 U/L — ABNORMAL HIGH (ref 0–44)
AST: 44 U/L — ABNORMAL HIGH (ref 15–41)
Albumin: 2.6 g/dL — ABNORMAL LOW (ref 3.5–5.0)
Alkaline Phosphatase: 45 U/L (ref 38–126)
Anion gap: 7 (ref 5–15)
BUN: 18 mg/dL (ref 8–23)
CO2: 24 mmol/L (ref 22–32)
Calcium: 8.1 mg/dL — ABNORMAL LOW (ref 8.9–10.3)
Chloride: 106 mmol/L (ref 98–111)
Creatinine, Ser: 0.71 mg/dL (ref 0.61–1.24)
GFR, Estimated: >60 mL/min (ref >60)
Glucose, Bld: 128 mg/dL — ABNORMAL HIGH (ref 70–99)
Potassium: 3.8 mmol/L (ref 3.5–5.1)
Sodium: 137 mmol/L (ref 135–145)
Total Bilirubin: 0.5 mg/dL (ref 0.3–1.2)
Total Protein: 5.6 g/dL — ABNORMAL LOW (ref 6.5–8.1)

## 2023-02-11 LAB — MAGNESIUM: Magnesium: 1.8 mg/dL (ref 1.7–2.4)

## 2023-02-11 LAB — URINE CULTURE: Culture: >=100000 — AB

## 2023-02-11 MED ORDER — OXYBUTYNIN CHLORIDE 5 MG PO TABS: 5.0000 mg | ORAL_TABLET | Freq: Three times a day (TID) | ORAL | 0 refills | Status: DC

## 2023-02-11 MED ORDER — CEPHALEXIN 500 MG PO CAPS: 500.0000 mg | ORAL_CAPSULE | Freq: Four times a day (QID) | ORAL | 0 refills | Status: AC

## 2023-02-11 NOTE — Plan of Care (Signed)

## 2023-02-11 NOTE — Discharge Summary (Signed)
Physician Discharge Summary   Patient: Roberto Leonard MRN: [id number] DOB: [birth year]  Admit date:     02/09/2023  Discharge date: 02/11/23  Discharge Physician: Lurene Shadow   PCP: Bosie Clos, MD   Recommendations at discharge:   Follow-up with PCP in 1 week  Discharge Diagnoses: Principal Problem:   Severe sepsis (HCC) Active Problems:   Transaminitis   Essential hypertension   Glaucoma   Hyperlipidemia   Paroxysmal atrial fibrillation (HCC)   Diabetes mellitus (HCC)   UTI (urinary tract infection)   Prostate cancer (HCC)   Secondary hypercoagulability disorder (HCC)   Bacteremia due to Klebsiella pneumoniae  Resolved Problems:   * No resolved hospital problems. *  Hospital Course:  Roberto Leonard is a 82 y.o. male with medical history significant for paroxysmal atrial fibrillation on Eliquis, RBBB, syncope, GERD, type II DM, hypertension, left eye pseudophakia, hyperlipidemia, prostate cancer on radiation therapy (completed 2 weeks ago) recent UTI that was treated with Augmentin (urine culture from 12/29/2022).  He presented to the hospital because of fatigue, bloody urine and subjective fever.     He was found to have sepsis secondary to acute UTI and bacteremia from Klebsiella pneumoniae.  He was treated with empiric IV antibiotics and IV fluids.    Assessment and Plan:  Severe sepsis secondary to Klebsiella UTI and bacteremia: Completed 3 days of IV ceftriaxone.  He will be discharged on Keflex 500 mg 4 times daily for 4 days to complete 7 days of treatment.   Of note, patient had recent UTI and urine culture from 12/29/2022 showed Klebsiella pneumoniae.  He had completed a course of Augmentin     Hematuria: Resolved.  Hematuria was probably from UTI. Bladder spasms: Continue oxybutynin.  He was taking mirabegron but he does not think it was helping.     Paroxysmal atrial fibrillation: Continue Eliquis and diltiazem     Hypomagnesemia: Improved      Type II DM: Continue pioglitazone     Elevated liver enzymes: Probably from fatty liver.  Monitor liver enzymes.     Prostate cancer: S/p radiation therapy.     Other comorbidities include glaucoma, hyperlipidemia, hypertension, nonobstructing 1 mm right renal stone  Of note, patient said he no longer takes rosuvastatin because it made him very weak.  He is here to discuss options for hyperlipidemia with his PCP.   His condition has improved significantly.  He is ambulating in the hallways without any problems.  He is deemed stable for discharge to home today.  Discharge plan was discussed with the patient, his wife and [name], daughter, at the bedside.        Consultants: None Procedures performed: None  Disposition: Home Diet recommendation:  Discharge Diet Orders (From admission, onward)     Start     Ordered   02/11/23 0000  Diet - low sodium heart healthy        02/11/23 1231           Cardiac and Carb modified diet DISCHARGE MEDICATION: Allergies as of 02/11/2023       Reactions   Sulfa Antibiotics Other (See Comments)   SWEATS, bad headache SWEATS, bad headache        Medication List     STOP taking these medications    cefdinir 300 MG capsule Commonly known as: OMNICEF   mirabegron ER 50 MG Tb24 tablet Commonly known as: Myrbetriq   rosuvastatin 20 MG tablet Commonly known as: CRESTOR  TAKE these medications    brimonidine 0.2 % ophthalmic solution Commonly known as: ALPHAGAN Place 1 drop into both eyes 2 (two) times daily.   cephALEXin 500 MG capsule Commonly known as: KEFLEX Take 1 capsule (500 mg total) by mouth 4 (four) times daily for 4 days.   Cetirizine HCl 10 MG Caps Take 10 mg by mouth daily.   cholecalciferol 1000 units tablet Commonly known as: VITAMIN D Take 1,000 Units by mouth daily.   diltiazem 180 MG 24 hr capsule Commonly known as: CARDIZEM CD Take 180 mg by mouth daily.   dorzolamide-timolol 2-0.5  % ophthalmic solution Commonly known as: COSOPT INSTILL 1 DROP INTO BOTH EYES TWICE A DAY   Eliquis 5 MG Tabs tablet Generic drug: apixaban Take 5 mg by mouth 2 (two) times daily.   Fish Oil Burp-Less 1200 MG Caps Take 2 capsules by mouth every evening.   losartan 100 MG tablet Commonly known as: COZAAR Take 100 mg by mouth daily.   multivitamin with minerals Tabs tablet Take 1 tablet by mouth daily.   omeprazole 40 MG capsule Commonly known as: PRILOSEC TAKE 1 CAPSULE BY MOUTH EVERY DAY   oxybutynin 5 MG tablet Commonly known as: DITROPAN Take 1 tablet (5 mg total) by mouth 3 (three) times daily.   pioglitazone 30 MG tablet Commonly known as: ACTOS Take 30 mg by mouth daily.   tamsulosin 0.4 MG Caps capsule Commonly known as: FLOMAX Take 1 capsule (0.4 mg total) by mouth 2 (two) times daily at 8 am and 10 pm.        Discharge Exam: Filed Weights   02/09/23 1140  Weight: 95.7 kg   GEN: NAD SKIN: Warm and dry EYES: No pallor or icterus ENT: MMM CV: RRR PULM: CTA B ABD: soft, ND, NT, +BS CNS: AAO x 3, non focal EXT: B/l leg edema, no tenderness   Condition at discharge: good  The results of significant diagnostics from this hospitalization (including imaging, microbiology, ancillary and laboratory) are listed below for reference.   Imaging Studies: US Abdomen Limited RUQ (LIVER/GB)  Result Date: 02/10/2023 CLINICAL DATA:  Transaminitis EXAM: ULTRASOUND ABDOMEN LIMITED RIGHT UPPER QUADRANT COMPARISON:  CT 02/09/2023 FINDINGS: Gallbladder: No gallstones or wall thickening visualized. No sonographic Murphy sign noted by sonographer. Common bile duct: Diameter: Normal at 4 mm Liver: Uniform increase in hepatic echogenicity. No duct dilatation. No focal lesion. Portal vein is patent on color Doppler imaging with normal direction of blood flow towards the liver. Other: No free fluid IMPRESSION: 1. Uniform increase in hepatic echogenicity suggests hepatic  steatosis. 2. No biliary abnormality. Electronically Signed   By: Genevive Bi M.D.   On: 02/10/2023 12:39   CT RENAL STONE STUDY  Result Date: 02/09/2023 CLINICAL DATA:  Abdominal/flank pain. Urinary tract stone disease suspected. Prostate cancer. EXAM: CT ABDOMEN AND PELVIS WITHOUT CONTRAST TECHNIQUE: Multidetector CT imaging of the abdomen and pelvis was performed following the standard protocol without IV contrast. RADIATION DOSE REDUCTION: This exam was performed according to the departmental dose-optimization program which includes automated exposure control, adjustment of the mA and/or kV according to patient size and/or use of iterative reconstruction technique. COMPARISON:  PET scan 10/30/2022 FINDINGS: Lower chest: Scarring at the lung bases similar to the PET scan in June of this year. Hepatobiliary: Fatty change of the liver. No focal lesion. No calcified gallstones. Pancreas: Normal Spleen: Normal Adrenals/Urinary Tract: Adrenal glands are normal. Multiple subpleural appearing renal cysts that do not require further follow-up. There  is a nonobstructing 1 mm stone in the upper pole the right kidney. No other sign of any stones in the kidneys. No hydroureteronephrosis. No stone in the bladder. Stomach/Bowel: Stomach and small intestine are normal. Normal appendix. No significant: Finding. Vascular/Lymphatic: Aortic atherosclerosis. No aneurysm. IVC is normal. No adenopathy. Reproductive: Normal. Normal appearance of the prostate gland by CT. Other: No free fluid or air. Left inguinal hernia containing only fat. Previous hernia repair on the right. Musculoskeletal: Chronic lower lumbar degenerative changes. IMPRESSION: 1. No acute finding to explain the clinical presentation. No evidence of significant urinary tract stone disease or obstruction. 1 mm nonobstructing stone in the upper pole of the right kidney. 2. Fatty change of the liver. 3. Aortic atherosclerosis. 4. Left inguinal hernia  containing only fat. Previous right inguinal hernia repair. Aortic Atherosclerosis (ICD10-I70.0). Electronically Signed   By: Paulina Fusi M.D.   On: 02/09/2023 20:37   DG Chest 2 View  Result Date: 02/09/2023 CLINICAL DATA:  Sepsis, fever EXAM: CHEST - 2 VIEW COMPARISON:  04/19/2020 FINDINGS: Coarse bibasilar interstitial markings. No confluent airspace disease or overt edema. Heart size and mediastinal contours are within normal limits. Aortic Atherosclerosis (ICD10-170.0). No effusion. Left chest subcutaneous implanted monitor stable. Regional bones unremarkable. IMPRESSION: Coarse bibasilar interstitial markings without focal airspace disease. Electronically Signed   By: Corlis Leak M.D.   On: 02/09/2023 12:42    Microbiology: Results for orders placed or performed during the hospital encounter of 02/09/23  Urine Culture     Status: Abnormal   Collection Time: 02/09/23 11:53 AM   Specimen: Urine, Random  Result Value Ref Range Status   Specimen Description   Final    URINE, RANDOM Performed at [facility], [address] [place], [place] [address]    Special Requests   Final    NONE Reflexed from [phone] Performed at [facility], [address] [place], [place] [address]    Culture >=100,000 COLONIES/mL KLEBSIELLA PNEUMONIAE (A)  Final   Report Status 02/11/2023 FINAL  Final   Organism ID, Bacteria KLEBSIELLA PNEUMONIAE (A)  Final      Susceptibility   Klebsiella pneumoniae - MIC*    AMPICILLIN >=32 RESISTANT Resistant     CEFAZOLIN <=4 SENSITIVE Sensitive     CEFEPIME <=0.12 SENSITIVE Sensitive     CEFTRIAXONE <=0.25 SENSITIVE Sensitive     CIPROFLOXACIN 1 RESISTANT Resistant     GENTAMICIN >=16 RESISTANT Resistant     IMIPENEM <=0.25 SENSITIVE Sensitive     NITROFURANTOIN 64 INTERMEDIATE Intermediate     TRIMETH/SULFA >=320 RESISTANT Resistant     AMPICILLIN/SULBACTAM >=32 RESISTANT Resistant     PIP/TAZO <=4 SENSITIVE Sensitive     * >=100,000  COLONIES/mL KLEBSIELLA PNEUMONIAE  Culture, blood (Routine x 2)     Status: Abnormal (Preliminary result)   Collection Time: 02/09/23 12:31 PM   Specimen: BLOOD RIGHT ARM  Result Value Ref Range Status   Specimen Description   Final    BLOOD RIGHT ARM Performed at [facility], [address] [place], [place] [address]    Special Requests   Final    BOTTLES DRAWN AEROBIC AND ANAEROBIC Blood Culture results may not be optimal due to an inadequate volume of blood received in culture bottles Performed at [facility], [address], [place], [place] [address]    Culture  Setup Time   Final    GRAM NEGATIVE RODS ANAEROBIC BOTTLE ONLY CRITICAL RESULT CALLED TO, READ BACK BY  AND VERIFIED WITH: ANDREA DOBBS AT 1610 02/10/23.PMF Performed at [facility], [address], [place], [place] [address]    Culture KLEBSIELLA PNEUMONIAE (A)  Final   Report Status PENDING  Incomplete  Culture, blood (Routine x 2)     Status: Abnormal (Preliminary result)   Collection Time: 02/09/23 12:31 PM   Specimen: BLOOD RIGHT HAND  Result Value Ref Range Status   Specimen Description   Final    BLOOD RIGHT HAND Performed at [facility], [address] [place], [place] [address]    Special Requests   Final    BOTTLES DRAWN AEROBIC AND ANAEROBIC Blood Culture results may not be optimal due to an inadequate volume of blood received in culture bottles Performed at [facility], [address] [place], [place] [address]    Culture  Setup Time   Final    GRAM NEGATIVE RODS IN BOTH AEROBIC AND ANAEROBIC BOTTLES CRITICAL RESULT CALLED TO, READ BACK BY AND VERIFIED WITH: ANDREA DOBBS AT 0940 02/10/23.PMF    Culture (A)  Final    KLEBSIELLA PNEUMONIAE SUSCEPTIBILITIES TO FOLLOW Performed at [facility], [address] [place], [place] [address]    Report Status PENDING  Incomplete  Blood Culture ID Panel (Reflexed)     Status: Abnormal   Collection Time:  02/09/23 12:31 PM  Result Value Ref Range Status   Enterococcus faecalis NOT DETECTED NOT DETECTED Final   Enterococcus Faecium NOT DETECTED NOT DETECTED Final   Listeria monocytogenes NOT DETECTED NOT DETECTED Final   Staphylococcus species NOT DETECTED NOT DETECTED Final   Staphylococcus aureus (BCID) NOT DETECTED NOT DETECTED Final   Staphylococcus epidermidis NOT DETECTED NOT DETECTED Final   Staphylococcus lugdunensis NOT DETECTED NOT DETECTED Final   Streptococcus species NOT DETECTED NOT DETECTED Final   Streptococcus agalactiae NOT DETECTED NOT DETECTED Final   Streptococcus pneumoniae NOT DETECTED NOT DETECTED Final   Streptococcus pyogenes NOT DETECTED NOT DETECTED Final   A.calcoaceticus-baumannii NOT DETECTED NOT DETECTED Final   Bacteroides fragilis NOT DETECTED NOT DETECTED Final   Enterobacterales DETECTED (A) NOT DETECTED Final    Comment: Enterobacterales represent a large order of gram negative bacteria, not a single organism. CRITICAL RESULT CALLED TO, READ BACK BY AND VERIFIED WITH: ANDREA DOBBS AT 0940 02/10/23.PMF    Enterobacter cloacae complex NOT DETECTED NOT DETECTED Final   Escherichia coli NOT DETECTED NOT DETECTED Final   Klebsiella aerogenes NOT DETECTED NOT DETECTED Final   Klebsiella oxytoca NOT DETECTED NOT DETECTED Final   Klebsiella pneumoniae DETECTED (A) NOT DETECTED Final    Comment: CRITICAL RESULT CALLED TO, READ BACK BY AND VERIFIED WITH: ANDREA DOBBS AT 0940 02/10/23.PMF    Proteus species NOT DETECTED NOT DETECTED Final   Salmonella species NOT DETECTED NOT DETECTED Final   Serratia marcescens NOT DETECTED NOT DETECTED Final   Haemophilus influenzae NOT DETECTED NOT DETECTED Final   Neisseria meningitidis NOT DETECTED NOT DETECTED Final   Pseudomonas aeruginosa NOT DETECTED NOT DETECTED Final   Stenotrophomonas maltophilia NOT DETECTED NOT DETECTED Final   Candida albicans NOT DETECTED NOT DETECTED Final   Candida auris NOT DETECTED NOT  DETECTED Final   Candida glabrata NOT DETECTED NOT DETECTED Final   Candida krusei NOT DETECTED NOT DETECTED Final   Candida parapsilosis NOT DETECTED NOT DETECTED Final   Candida tropicalis NOT DETECTED NOT DETECTED Final   Cryptococcus neoformans/gattii NOT DETECTED NOT DETECTED Final   CTX-M ESBL NOT DETECTED NOT DETECTED Final  Carbapenem resistance IMP NOT DETECTED NOT DETECTED Final   Carbapenem resistance KPC NOT DETECTED NOT DETECTED Final   Carbapenem resistance NDM NOT DETECTED NOT DETECTED Final   Carbapenem resist OXA 48 LIKE NOT DETECTED NOT DETECTED Final   Carbapenem resistance VIM NOT DETECTED NOT DETECTED Final    Comment: Performed at [facility], [address] [place], [place] [address]    Labs: CBC: Recent Labs  Lab 02/09/23 1231 02/10/23 0412 02/11/23 0337  WBC 4.3 10.9* 6.8  NEUTROABS 3.8  --  4.8  HGB 14.5 11.7* 11.6*  HCT 44.6 34.8* 34.6*  MCV 90.5 87.9 87.6  PLT 139* 104* 104*   Basic Metabolic Panel: Recent Labs  Lab 02/09/23 1231 02/10/23 0411 02/10/23 0412 02/11/23 0337  NA 139  --  138 137  K 4.0  --  3.8 3.8  CL 106  --  106 106  CO2 22  --  25 24  GLUCOSE 157*  --  121* 128*  BUN 21  --  18 18  CREATININE 0.83  --  0.81 0.71  CALCIUM 9.0  --  8.2* 8.1*  MG  --  1.4*  --  1.8   Liver Function Tests: Recent Labs  Lab 02/09/23 1231 02/10/23 0412 02/11/23 0337  AST 61* 46* 44*  ALT 64* 49* 48*  ALKPHOS 80 42 45  BILITOT 1.0 0.7 0.5  PROT 7.5 5.4* 5.6*  ALBUMIN 3.9 2.7* 2.6*   CBG: Recent Labs  Lab 02/10/23 1138 02/10/23 1721 02/10/23 2124 02/11/23 0759 02/11/23 1131  GLUCAP 171* 105* 144* 150* 119*    Discharge time spent: greater than 30 minutes.  Signed: Lurene Shadow, MD [facility] 02/11/2023

## 2023-02-11 NOTE — Progress Notes (Signed)
IV removed without complications. Discharge education completed. Patient verbalized understanding. Wheel chair called to transport patient to the medical mall exit to be discharged to the care of the family in stable condition.  Roberto Leonard

## 2023-02-11 NOTE — Plan of Care (Signed)
Discharging, resolve care plan.  Roberto Leonard

## 2023-02-12 LAB — CULTURE, BLOOD (ROUTINE X 2)

## 2023-02-23 ENCOUNTER — Other Ambulatory Visit: Payer: Self-pay | Admitting: Radiation Oncology

## 2023-02-25 NOTE — Telephone Encounter (Signed)
Request filled.

## 2023-03-08 NOTE — Plan of Care (Signed)
 [facility] Tonsillectomy/Adenoidectomy, Postoperative PEDS care plan entered in error.

## 2023-03-11 ENCOUNTER — Ambulatory Visit
Admission: RE | Admit: 2023-03-11 | Discharge: 2023-03-11 | Disposition: A | Discharge status: Home / Self Care | Payer: Medicare PPO | Source: Ambulatory Visit | Attending: Radiation Oncology | Admitting: Radiation Oncology

## 2023-03-11 ENCOUNTER — Other Ambulatory Visit: Payer: Self-pay | Admitting: *Deleted

## 2023-03-11 ENCOUNTER — Encounter: Payer: Self-pay | Admitting: Radiation Oncology

## 2023-03-11 VITALS — BP 138/82 | HR 82 | Temp 97.1°F | Resp 16 | Wt 212.0 lb

## 2023-03-11 DIAGNOSIS — C61 Malignant neoplasm of prostate: Secondary | ICD-10-CM

## 2023-03-11 NOTE — Progress Notes (Unsigned)
03/14/2023 2:56 PM   Holland Falling 04/29/1941 [id number]  Referring provider: Bosie Clos, MD [address] [place],  [place] [address]  Urological history: 1.  Prostate cancer -PSA in December -T1c prostate cancer -Fiduciary marker placement (10/2022) for IMRT completed 01/2023  2. BPH with LU TS -tamsulosin 0.4 mg daily   Chief Complaint  Patient presents with   Follow-up   HPI: Roberto Leonard is a 82 y.o. male who presents today for rUTI's.  Irritative voiding symptoms and nocturia with his wife, Roberto Leonard.   Previous records reviewed.   He was hospitalized over the summer for sepsis secondary to Klebsiella pneumonia UTI.  Non contrast CT just had a nonobstructing 1 mm stone in upper pole of the right kidney and right renal cysts.   He has been having daytime frequency, urgency, dysuria and nocturia x 6-7 times a night.  Patient denies any modifying or aggravating factors.  Patient denies any gross hematuria or suprapubic/flank pain.  Patient denies any fevers, chills, nausea or vomiting.    He states his urinary tract infection symptoms consist of having no energy, frequency, dysuria, urgency and nocturia.   He has been on several antibiotics over the last several months.  They have not caused any resolution of his symptoms.    I PSS 15/6  UA unremarkable  PVR 0 mL    IPSS     Row Name 03/14/23 1400         International Prostate Symptom Score   How often have you had the sensation of not emptying your bladder? Less than half the time     How often have you had to urinate less than every two hours? More than half the time     How often have you found you stopped and started again several times when you urinated? Not at All     How often have you found it difficult to postpone urination? Almost always     How often have you had a weak urinary stream? Not at All     How often have you had to strain to start urination? Not at All     How many  times did you typically get up at night to urinate? 4 Times     Total IPSS Score 15       Quality of Life due to urinary symptoms   If you were to spend the rest of your life with your urinary condition just the way it is now how would you feel about that? Terrible              Score:  1-7 Mild 8-19 Moderate 20-35 Severe   PMH: Past Medical History:  Diagnosis Date   Allergy    Detached retina    RIGHT   Diabetes mellitus without complication (HCC)    ORAL MED   Dupuytren's contracture    ED (erectile dysfunction)    GERD (gastroesophageal reflux disease)    Glaucoma    Hyperlipidemia    Hypertension     Surgical History: Past Surgical History:  Procedure Laterality Date   CATARACT EXTRACTION     right   COLONOSCOPY WITH PROPOFOL N/A 01/03/2015   Procedure: COLONOSCOPY WITH PROPOFOL;  Surgeon: Midge Minium, MD;  Location: [facility];  Service: Endoscopy;  Laterality: N/A;  DIABETIC-ORAL MEDS   COLONOSCOPY WITH PROPOFOL N/A 10/20/2019   Procedure: COLONOSCOPY WITH PROPOFOL;  Surgeon: Midge Minium, MD;  Location: [facility];  Service: Endoscopy;  Laterality: N/A;   EYE SURGERY     HERNIA REPAIR     LOOP RECORDER INSERTION N/A 11/15/2016   Procedure: Loop Recorder Insertion;  Surgeon: Duke Salvia, MD;  Location: [facility];  Service: Cardiovascular;  Laterality: N/A;   POLYPECTOMY N/A 01/03/2015   Procedure: POLYPECTOMY;  Surgeon: Midge Minium, MD;  Location: [facility];  Service: Endoscopy;  Laterality: N/A;  Sigmoid polyp   RETINAL DETACHMENT SURGERY     right eye, wrong size lense, secondary surgery to replace it    Home Medications:  Allergies as of 03/14/2023       Reactions   Sulfa Antibiotics Other (See Comments)   SWEATS, bad headache SWEATS, bad headache        Medication List        Accurate as of March 14, 2023  2:56 PM. If you have any questions, ask your nurse or doctor.          STOP taking these  medications    oxybutynin 5 MG tablet Commonly known as: DITROPAN Stopped by: Jeraldin Fesler       TAKE these medications    brimonidine 0.2 % ophthalmic solution Commonly known as: ALPHAGAN Place 1 drop into both eyes 2 (two) times daily.   Cetirizine HCl 10 MG Caps Take 10 mg by mouth daily.   cholecalciferol 1000 units tablet Commonly known as: VITAMIN D Take 1,000 Units by mouth daily.   diltiazem 180 MG 24 hr capsule Commonly known as: CARDIZEM CD Take 180 mg by mouth daily.   dorzolamide-timolol 2-0.5 % ophthalmic solution Commonly known as: COSOPT INSTILL 1 DROP INTO BOTH EYES TWICE A DAY   Eliquis 5 MG Tabs tablet Generic drug: apixaban Take 5 mg by mouth 2 (two) times daily.   Fish Oil Burp-Less 1200 MG Caps Take 2 capsules by mouth every evening.   Gemtesa 75 MG Tabs Generic drug: Vibegron Take 1 tablet (75 mg total) by mouth daily. Started by: Michiel Cowboy   losartan 100 MG tablet Commonly known as: COZAAR Take 100 mg by mouth daily.   multivitamin with minerals Tabs tablet Take 1 tablet by mouth daily.   omeprazole 40 MG capsule Commonly known as: PRILOSEC TAKE 1 CAPSULE BY MOUTH EVERY DAY   pioglitazone 30 MG tablet Commonly known as: ACTOS Take 30 mg by mouth daily.   tamsulosin 0.4 MG Caps capsule Commonly known as: FLOMAX TAKE 1 CAPSULE BY MOUTH EVERY DAY AFTER SUPPER        Allergies:  Allergies  Allergen Reactions   Sulfa Antibiotics Other (See Comments)    SWEATS, bad headache SWEATS, bad headache    Family History: Family History  Problem Relation Age of Onset   Heart disease Mother        Died from CHF   Diabetes Mother    Diabetes Father    Hypertension Father    Heart disease Father        plaque build up   Diabetes Brother    Diabetes Daughter        type 1    Social History:  reports that he quit smoking about 59 years ago. His smoking use included cigarettes. He started smoking about 61 years ago. He  has a 1 pack-year smoking history. He has been exposed to tobacco smoke. He has never used smokeless tobacco. He reports that he does not drink alcohol and does not use drugs.  ROS: Pertinent ROS in  HPI  Physical Exam: BP 113/74   Pulse 85   Ht 6\' 2"  (1.88 m)   Wt 212 lb (96.2 kg)   BMI 27.22 kg/m   Constitutional:  Well nourished. Alert and oriented, No acute distress. HEENT: [place] AT, moist mucus membranes.  Trachea midline Cardiovascular: No clubbing, cyanosis, or edema. Respiratory: Normal respiratory effort, no increased work of breathing. Neurologic: Grossly intact, no focal deficits, moving all 4 extremities. Psychiatric: Normal mood and affect.  Laboratory Data: Lab Results  Component Value Date   WBC 6.8 02/11/2023   HGB 11.6 (L) 02/11/2023   HCT 34.6 (L) 02/11/2023   MCV 87.6 02/11/2023   PLT 104 (L) 02/11/2023    Lab Results  Component Value Date   CREATININE 0.71 02/11/2023   Lab Results  Component Value Date   HGBA1C 6.7 (H) 02/09/2023    Lab Results  Component Value Date   TSH 0.431 02/09/2023    Lab Results  Component Value Date   AST 44 (H) 02/11/2023   Lab Results  Component Value Date   ALT 48 (H) 02/11/2023   Urinalysis See HPI and EPIC  I have reviewed the labs.   Pertinent Imaging: CLINICAL DATA:  Abdominal/flank pain. Urinary tract stone disease suspected. Prostate cancer.   EXAM: CT ABDOMEN AND PELVIS WITHOUT CONTRAST   TECHNIQUE: Multidetector CT imaging of the abdomen and pelvis was performed following the standard protocol without IV contrast.   RADIATION DOSE REDUCTION: This exam was performed according to the departmental dose-optimization program which includes automated exposure control, adjustment of the mA and/or kV according to patient size and/or use of iterative reconstruction technique.   COMPARISON:  PET scan 10/30/2022   FINDINGS: Lower chest: Scarring at the lung bases similar to the PET scan in June of  this year.   Hepatobiliary: Fatty change of the liver. No focal lesion. No calcified gallstones.   Pancreas: Normal   Spleen: Normal   Adrenals/Urinary Tract: Adrenal glands are normal. Multiple subpleural appearing renal cysts that do not require further follow-up. There is a nonobstructing 1 mm stone in the upper pole the right kidney. No other sign of any stones in the kidneys. No hydroureteronephrosis. No stone in the bladder.   Stomach/Bowel: Stomach and small intestine are normal. Normal appendix. No significant: Finding.   Vascular/Lymphatic: Aortic atherosclerosis. No aneurysm. IVC is normal. No adenopathy.   Reproductive: Normal. Normal appearance of the prostate gland by CT.   Other: No free fluid or air. Left inguinal hernia containing only fat. Previous hernia repair on the right.   Musculoskeletal: Chronic lower lumbar degenerative changes.   IMPRESSION: 1. No acute finding to explain the clinical presentation. No evidence of significant urinary tract stone disease or obstruction. 1 mm nonobstructing stone in the upper pole of the right kidney. 2. Fatty change of the liver. 3. Aortic atherosclerosis. 4. Left inguinal hernia containing only fat. Previous right inguinal hernia repair.   Aortic Atherosclerosis (ICD10-I70.0).     Electronically Signed   By: Paulina Fusi M.D.   On: 02/09/2023 20:37   03/14/23 14:22  Scan Result 0 ml   I have independently reviewed the films.  See HPI.    Assessment & Plan:    1. rUTI's/dysuria -Explained that his symptoms of no energy may be from the result of him unable to get good night sleep for the last several months because he was getting up 6-7 times a night to urinate -Explained that the dysuria may also be the  result of the radiation along with the urgency and frequency and hopefully this will abate over the next several months as he has completed his radiation -today's UA unremarkable -Patient request that his  be sent for culture because he was told that he might have residual bladder infection and he looked it up on the Internet and it concerned him -Symptoms may be due to the irritation of the bladder due to the radiation -I will have him discontinue the tamsulosin and start Gemtesa 75 mg daily -He will follow-up in 6 weeks in office, but I have asked him to contact me via MyChart in 2 weeks if his symptoms have not diminished while being on the Gemtesa  2. Nocturia -Start Gemtesa 5 mg at night  3. BPH  with LU TS -UA benign  -PVR < 300 cc  -most bothersome symptoms are frequency, urgency, dysuria and nocturia -continue conservative management, avoiding bladder irritants and timed voiding's -Discontinue tamsulosin 0.4 mg daily  Return in about 6 weeks (around 04/25/2023) for I PSS and PVR .  These notes generated with voice recognition software. I apologize for typographical errors.  Cloretta Ned  [facility] [address]  [address] [place], [place] [address] [phone]

## 2023-03-11 NOTE — Progress Notes (Signed)
Radiation Oncology Follow up Note  Name: Roberto Leonard   Date:   03/11/2023 MRN:  [id number] DOB: Sep 30, 1940    This 82 y.o. male presents to the clinic today for 1 month follow-up status post IMRT radiation therapy for Gleason 7 (4+3) adenocarcinoma the prostate presenting with a PSA of 6.3..  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 He presents one month post-treatment with an initial presenting PSA of 6.3. The patient has been struggling with recurrent UTIs, including a hospitalization in September for Klebsiella pneumoniae bacteremia secondary to a UTI. Despite treatment with Augmentin and oxacillin, the patient continues to experience symptoms of UTIs, including burning urination, urinary frequency, and urgency. The patient reports waking up to urinate between three to seven times a night and needing to use the bathroom every thirty minutes during the day to avoid accidents. He is currently taking  (Flomax) at night and drinking cranberry juice, but has avoided Azor due to concerns about staining his clothes during episodes of incontinence..  COMPLICATIONS OF TREATMENT: none  FOLLOW UP COMPLIANCE: keeps appointments   PHYSICAL EXAM:  BP 138/82   Pulse 82   Temp (!) 97.1 F (36.2 C) (Tympanic)   Resp 16   Wt 212 lb (96.2 kg)   BMI 27.22 kg/m  Well-developed well-nourished patient in NAD. HEENT reveals PERLA, EOMI, discs not visualized.  Oral cavity is clear. No oral mucosal lesions are identified. Neck is clear without evidence of cervical or supraclavicular adenopathy. Lungs are clear to A&P. Cardiac examination is essentially unremarkable with regular rate and rhythm without murmur rub or thrill. Abdomen is benign with no organomegaly or masses noted. Motor sensory and DTR levels are equal and symmetric in the upper and lower extremities. Cranial  nerves II through XII are grossly intact. Proprioception is intact. No peripheral adenopathy or edema is identified. No motor or sensory levels are noted. Crude visual fields are within normal range.  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 PSA was 6.3 at presentation. No current symptoms related to radiation therapy. -Return in three months for follow-up and PSA testing.  Recurrent Urinary Tract Infections Recent hospitalization for Klebsiella pneumoniae bacteremia secondary to UTI. Currently experiencing urinary frequency, urgency, and dysuria. -Continue current antibiotic regimen. -Refer to Urology for further management (appointment scheduled for this Thursday). -Consider Vitamin E supplements for hot flashes related to hormone therapy.  Urinary Incontinence Reports urgency incontinence, leading to frequent bathroom use and occasional accidents. -Continue Tamsulosin (Flomax) nightly. -Discuss further management with Urology during upcoming appointment.    Carmina Miller, MD

## 2023-03-14 ENCOUNTER — Ambulatory Visit: Payer: Medicare PPO | Admitting: Urology

## 2023-03-14 ENCOUNTER — Encounter: Payer: Self-pay | Admitting: Urology

## 2023-03-14 VITALS — BP 113/74 | HR 85 | Ht 74.0 in | Wt 212.0 lb

## 2023-03-14 DIAGNOSIS — N401 Enlarged prostate with lower urinary tract symptoms: Secondary | ICD-10-CM | POA: Diagnosis not present

## 2023-03-14 DIAGNOSIS — C61 Malignant neoplasm of prostate: Secondary | ICD-10-CM | POA: Diagnosis not present

## 2023-03-14 DIAGNOSIS — Z87440 Personal history of urinary (tract) infections: Secondary | ICD-10-CM

## 2023-03-14 DIAGNOSIS — R351 Nocturia: Secondary | ICD-10-CM

## 2023-03-14 DIAGNOSIS — N138 Other obstructive and reflux uropathy: Secondary | ICD-10-CM

## 2023-03-14 DIAGNOSIS — R300 Dysuria: Secondary | ICD-10-CM | POA: Diagnosis not present

## 2023-03-14 LAB — URINALYSIS, COMPLETE
Bilirubin, UA: NEGATIVE
Glucose, UA: NEGATIVE
Ketones, UA: NEGATIVE
Leukocytes,UA: NEGATIVE
Nitrite, UA: NEGATIVE
Protein,UA: NEGATIVE
Specific Gravity, UA: 1.025 (ref 1.005–1.030)
Urobilinogen, Ur: 0.2 mg/dL (ref 0.2–1.0)
pH, UA: 5.5 (ref 5.0–7.5)

## 2023-03-14 LAB — MICROSCOPIC EXAMINATION

## 2023-03-14 LAB — BLADDER SCAN AMB NON-IMAGING: Scan Result: 0

## 2023-03-14 MED ORDER — GEMTESA 75 MG PO TABS: 75.0000 mg | ORAL_TABLET | Freq: Every day | ORAL | Status: DC

## 2023-03-17 LAB — CULTURE, URINE COMPREHENSIVE

## 2023-03-18 ENCOUNTER — Telehealth: Payer: Self-pay | Admitting: Urology

## 2023-03-18 NOTE — Telephone Encounter (Signed)
Patient dropped in office this morning to return unopened Gemtesa samples that he was given at his appointment with Michiel Cowboy on 03/14/23. He said he had a reaction to medication and had to stop taking it. He left a note for [facility] which I will scan in his chart and forward to [facility].

## 2023-04-10 ENCOUNTER — Other Ambulatory Visit: Payer: Self-pay | Admitting: Urology

## 2023-04-10 DIAGNOSIS — R351 Nocturia: Secondary | ICD-10-CM

## 2023-04-10 MED ORDER — TROSPIUM CHLORIDE 20 MG PO TABS: 20.0000 mg | ORAL_TABLET | Freq: Two times a day (BID) | ORAL | 1 refills | Status: AC

## 2023-05-02 ENCOUNTER — Ambulatory Visit: Payer: Medicare PPO | Admitting: Urology

## 2023-05-23 ENCOUNTER — Telehealth: Payer: Self-pay | Admitting: Urology

## 2023-05-23 NOTE — Telephone Encounter (Signed)
 I spoke with Roberto Leonard regarding the fact he felt the trospium  was not helpful.  He also felt the Gemtesa  was not helpful.  I offered him to undergo cystoscopic examination, but he stated his symptoms were not that severe.  He will contact us  if he feels his symptoms worsen.

## 2023-05-31 ENCOUNTER — Ambulatory Visit: Payer: Medicare PPO | Admitting: Urology

## 2023-06-03 ENCOUNTER — Inpatient Hospital Stay: Payer: Medicare PPO | Attending: Radiation Oncology

## 2023-06-03 DIAGNOSIS — C61 Malignant neoplasm of prostate: Secondary | ICD-10-CM | POA: Diagnosis present

## 2023-06-03 LAB — PSA: Prostatic Specific Antigen: 0.01 ng/mL (ref 0.00–4.00)

## 2023-06-05 ENCOUNTER — Inpatient Hospital Stay: Payer: Medicare PPO

## 2023-06-12 ENCOUNTER — Encounter: Payer: Self-pay | Admitting: Radiation Oncology

## 2023-06-12 ENCOUNTER — Other Ambulatory Visit: Payer: Self-pay | Admitting: *Deleted

## 2023-06-12 ENCOUNTER — Ambulatory Visit
Admission: RE | Admit: 2023-06-12 | Discharge: 2023-06-12 | Disposition: A | Discharge status: Home / Self Care | Payer: Medicare PPO | Source: Ambulatory Visit | Attending: Radiation Oncology | Admitting: Radiation Oncology

## 2023-06-12 VITALS — BP 118/71 | HR 70 | Temp 98.2°F | Resp 16 | Ht 74.0 in | Wt 220.6 lb

## 2023-06-12 DIAGNOSIS — R300 Dysuria: Secondary | ICD-10-CM | POA: Diagnosis not present

## 2023-06-12 DIAGNOSIS — R350 Frequency of micturition: Secondary | ICD-10-CM | POA: Diagnosis not present

## 2023-06-12 DIAGNOSIS — Z923 Personal history of irradiation: Secondary | ICD-10-CM | POA: Insufficient documentation

## 2023-06-12 DIAGNOSIS — C61 Malignant neoplasm of prostate: Secondary | ICD-10-CM | POA: Diagnosis present

## 2023-06-12 MED ORDER — MIRABEGRON ER 8 MG/ML PO SRER: 1.0000 mL | Freq: Every day | ORAL | 1 refills | Status: AC

## 2023-06-12 NOTE — Progress Notes (Signed)
Radiation Oncology Follow up Note  Name: Roberto Leonard   Date:   06/12/2023 MRN:  [id number] DOB: 05-03-1941    This 83 y.o. male presents to the clinic today for 26-month follow-up status post image guided IMRT radiation therapy for Gleason 7 (4+3) adenocarcinoma presenting with a PSA of 6.3.  REFERRING PROVIDER: Bosie Clos, MD  HPI: Patient is a 83 year old male now seen out for months having completed IMRT radiation therapy to his prostate for a Gleason 7 (4+3) adenocarcinoma presenting with a PSA of 6.3 seen today in routine follow-up he still complains of dysuria burning urinary frequency up to 6-7 times at night.  He has been seeing urology he is off all medications at this time when questioning he said medication did not work although he is not even drinking cranberry juice or taking Azo.  His PSA has normalized to look 0.01 showing excellent response..  COMPLICATIONS OF TREATMENT: none  FOLLOW UP COMPLIANCE: keeps appointments   PHYSICAL EXAM:  BP 118/71   Pulse 70   Temp 98.2 F (36.8 C) (Tympanic)   Resp 16   Ht 6\' 2"  (1.88 m)   Wt 220 lb 9.6 oz (100.1 kg)   BMI 28.32 kg/m  Well-developed well-nourished patient in NAD. HEENT reveals PERLA, EOMI, discs not visualized.  Oral cavity is clear. No oral mucosal lesions are identified. Neck is clear without evidence of cervical or supraclavicular adenopathy. Lungs are clear to A&P. Cardiac examination is essentially unremarkable with regular rate and rhythm without murmur rub or thrill. Abdomen is benign with no organomegaly or masses noted. Motor sensory and DTR levels are equal and symmetric in the upper and lower extremities. Cranial nerves II through XII are grossly intact. Proprioception is intact. No peripheral adenopathy or edema is identified. No motor or sensory levels are noted. Crude visual fields are within normal range.  RADIOLOGY RESULTS: No current films for review  PLAN: Present time I have suggested trying  cranberry juice again is much as possible as well as Azo.  I am also starting him on Mybetriq for his urinary symptoms.  Of asked him to follow-up with urology and continue with their guidance for his urinary symptoms.  He does have a longstanding history of UTI which predated his radiation treatments and unsure is associated with some of his increased lower urinary tract symptoms.  I have asked to see him back in 6 months with a repeat PSA.  Patient comprehends my recommendations well.  I would like to take this opportunity to thank you for allowing me to participate in the care of your patient.Roberto Miller, MD

## 2023-11-02 ENCOUNTER — Ambulatory Visit
Admission: EM | Admit: 2023-11-02 | Discharge: 2023-11-02 | Disposition: A | Discharge status: Home / Self Care | Attending: Emergency Medicine | Admitting: Emergency Medicine

## 2023-11-02 ENCOUNTER — Encounter: Payer: Self-pay | Admitting: Emergency Medicine

## 2023-11-02 DIAGNOSIS — S40861A Insect bite (nonvenomous) of right upper arm, initial encounter: Secondary | ICD-10-CM

## 2023-11-02 DIAGNOSIS — L03113 Cellulitis of right upper limb: Secondary | ICD-10-CM

## 2023-11-02 DIAGNOSIS — W57XXXA Bitten or stung by nonvenomous insect and other nonvenomous arthropods, initial encounter: Secondary | ICD-10-CM

## 2023-11-02 MED ORDER — CEPHALEXIN 500 MG PO CAPS: 500.0000 mg | ORAL_CAPSULE | Freq: Three times a day (TID) | ORAL | 0 refills | Status: AC

## 2023-11-02 MED ORDER — PREDNISONE 20 MG PO TABS: 40.0000 mg | ORAL_TABLET | Freq: Every day | ORAL | 0 refills | Status: AC

## 2023-11-02 NOTE — ED Triage Notes (Signed)
 Patient reports 5 days ago he thinks he has been bitten by something. Patient complains of itching  and swelling right forearm.. Denies pain at this time. Has been taken benadryl po. Last dose of benadryl po was 5 pm yesterday. Patient also has been using topical benadryl. Last use was 9:00 pm last night.

## 2023-11-02 NOTE — ED Provider Notes (Signed)
 CAY RALPH PELT    CSN: [id number] Arrival date & time: 11/02/23  9148      History   Chief Complaint Chief Complaint  Patient presents with   Insect Bite    With itching    HPI ZYMARION FAVORITE is a 83 y.o. male.   Patient presents for evaluation of pruritus and swelling present to the right arm after suspected insect bite 5 days ago, insect unwitnessed.  Endorses progressing swelling and feels that there is pooling of fluid to the right upper extremity, able to shake arm and cause skin to wall which is not normal.  Denies drainage, fever, chills.  Has attempted use of Benadryl without improvement.  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   Fatigue 05/30/2021   Cough 04/19/2020   History of colonic polyps    Pseudophakia of left eye 06/23/2018   Diabetes mellitus (HCC) 06/16/2018   Paroxysmal atrial fibrillation (HCC) 12/26/2016   RBBB 12/26/2016   Sinus bradycardia 12/26/2016   Syncope 11/11/2016   Memory loss 04/30/2015   Special screening for malignant neoplasms, colon    Benign neoplasm of sigmoid colon    Allergic rhinitis 09/24/2014   Benign fibroma of prostate 09/24/2014   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   Cheilitis 09/24/2014   Transaminitis 09/24/2014   Erectile dysfunction associated with type 2 diabetes mellitus (HCC) 09/24/2014   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/2014   Essential hypertension 09/24/2014   Gastroesophageal  reflux disease 09/24/2014   Glaucoma 09/24/2014   Inguinal hernia 09/24/2014   Hyperlipidemia 09/24/2014   Detached retina 09/24/2014   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    Past Surgical History:  Procedure Laterality Date   CATARACT EXTRACTION     right   COLONOSCOPY WITH PROPOFOL  N/A 01/03/2015   Procedure: COLONOSCOPY WITH PROPOFOL ;  Surgeon: Rogelia Copping, MD;  Location: [facility];  Service: Endoscopy;  Laterality: N/A;  DIABETIC-ORAL MEDS   COLONOSCOPY WITH PROPOFOL  N/A 10/20/2019   Procedure: COLONOSCOPY WITH PROPOFOL ;  Surgeon: Copping Rogelia, MD;  Location: [facility] END[facility];  Service: Endoscopy;  Laterality: N/A;   EYE SURGERY     HERNIA REPAIR     LOOP RECORDER INSERTION N/A 11/15/2016   Procedure: Loop Recorder Insertion;  Surgeon: Fernande Elspeth BROCKS, MD;  Location: [facility];  Service: Cardiovascular;  Laterality: N/A;   POLYPECTOMY N/A 01/03/2015   Procedure: POLYPECTOMY;  Surgeon: Rogelia Copping, MD;  Location: [facility];  Service: Endoscopy;  Laterality: N/A;  Sigmoid polyp   RETINAL DETACHMENT SURGERY     right eye, wrong size lense, secondary surgery to replace it       Home Medications    Prior to Admission medications   Medication Sig Start Date End Date Taking? Authorizing Provider  cephALEXin  (KEFLEX ) 500 MG capsule Take 1 capsule (500 mg total) by mouth 3 (three) times daily for 5 days.  11/02/23 11/07/23 Yes Sarahanne Novakowski R, NP  predniSONE (DELTASONE) 20 MG tablet Take 2 tablets (40 mg total) by mouth daily. 11/02/23  Yes Krislyn Donnan R, NP  apixaban  (ELIQUIS ) 5 MG TABS tablet Take 5 mg by mouth 2 (two) times daily.    [provider]  brimonidine  (ALPHAGAN ) 0.2 % ophthalmic solution Place 1 drop into both eyes 2 (two) times daily. 04/11/15   [provider]  Cetirizine HCl 10 MG CAPS Take 10 mg by mouth daily. 03/26/11   [provider]  cholecalciferol  (VITAMIN D ) 1000  units tablet Take 1,000 Units by mouth daily.    [provider]  diltiazem  (CARDIZEM  CD) 180 MG 24 hr capsule Take 180 mg by mouth daily.  06/18/19   [provider]  dorzolamide -timolol  (COSOPT ) 22.3-6.8 MG/ML ophthalmic solution INSTILL 1 DROP INTO BOTH EYES TWICE A DAY 08/21/21   Bertrum Charlie CROME, MD  losartan  (COZAAR ) 100 MG tablet Take 100 mg by mouth daily.    [provider]  Mirabegron  ER 8 MG/ML SRER Take 1 mL by mouth daily. 06/12/23   Lenn Aran, MD  Multiple Vitamin (MULTIVITAMIN WITH MINERALS) TABS tablet Take 1 tablet by mouth daily.    [provider]  Omega-3 Fatty Acids (FISH OIL  BURP-LESS) 1200 MG CAPS Take 2 capsules by mouth every evening.  03/26/11   [provider]  omeprazole  (PRILOSEC) 40 MG capsule TAKE 1 CAPSULE BY MOUTH EVERY DAY 01/18/22   Bertrum Charlie CROME, MD  pioglitazone  (ACTOS ) 30 MG tablet Take 30 mg by mouth daily.    [provider]  tamsulosin  (FLOMAX ) 0.4 MG CAPS capsule TAKE 1 CAPSULE BY MOUTH EVERY DAY AFTER SUPPER 02/25/23   Lenn Aran, MD  trospium  (SANCTURA ) 20 MG tablet Take 1 tablet (20 mg total) by mouth 2 (two) times daily. 04/10/23   Helon Clotilda LABOR, PA-C    Family History Family History  Problem Relation Age of Onset   Heart disease Mother        Died from CHF   Diabetes Mother    Diabetes Father    Hypertension Father    Heart disease Father        plaque build up   Diabetes Brother    Diabetes Daughter        type 1    Social History Social History   Tobacco Use   Smoking status: Former    Current packs/day: 0.00    Average packs/day: 0.5 packs/day for 2.0 years (1.0 ttl pk-yrs)    Types: Cigarettes    Start date: 05/13/1961    Quit date: 05/14/1963    Years since quitting: 60.5    Passive exposure: Past   Smokeless tobacco: Never  Vaping Use   Vaping status: Never Used  Substance Use Topics   Alcohol use: No    Alcohol/week: 0.0 standard drinks of alcohol    Drug use: No     Allergies   Sulfa antibiotics   Review of Systems Review of Systems   Physical Exam Triage Vital Signs ED Triage Vitals  Encounter Vitals Group     BP 11/02/23 0918 120/74     Girls Systolic BP Percentile --      Girls Diastolic BP Percentile --      Boys Systolic BP Percentile --      Boys Diastolic BP Percentile --      Pulse Rate 11/02/23 0918 75     Resp 11/02/23 0918 18  Temp 11/02/23 0918 98 F (36.7 C)     Temp Source 11/02/23 0918 Oral     SpO2 11/02/23 0918 96 %     Weight --      Height --      Head Circumference --      Peak Flow --      Pain Score 11/02/23 0915 0     Pain Loc --      Pain Education --      Exclude from Growth Chart --    No data found.  Updated Vital Signs BP 120/74 (BP Location: Left Arm)   Pulse 75   Temp 98 F (36.7 C) (Oral)   Resp 18   SpO2 96%   Visual Acuity Right Eye Distance:   Left Eye Distance:   Bilateral Distance:    Right Eye Near:   Left Eye Near:    Bilateral Near:     Physical Exam Constitutional:      Appearance: Normal appearance.   Eyes:     Extraocular Movements: Extraocular movements intact.   Pulmonary:     Effort: Pulmonary effort is normal.   Skin:    Comments: Mild swelling present to the medial aspect of the right upper arm, no erythema or drainage noted, nontender, skin warm to the touch   Neurological:     Mental Status: He is alert and oriented to person, place, and time. Mental status is at baseline.      UC Treatments / Results  Labs (all labs ordered are listed, but only abnormal results are displayed) Labs Reviewed - No data to display  EKG   Radiology No results found.  Procedures Procedures (including critical care time)  Medications Ordered in UC Medications - No data to display  Initial Impression / Assessment and Plan / UC Course  I have reviewed the triage vital signs and the nursing notes.  Pertinent labs & imaging results that were  available during my care of the patient were reviewed by me and considered in my medical decision making (see chart for details).  Insect bite of right arm, cellulitis of right arm  Etiology localized reaction versus infection, swelling to the arm is mild and area of concern is primarily stretch skin that has become more prominent due to swelling, discussed this with patient, prescribed cephalexin  and prednisone and recommended supportive care advised follow-up with urgent care or primary doctor if symptoms continue to persist Final Clinical Impressions(s) / UC Diagnoses   Final diagnoses:  Insect bite of right arm, initial encounter  Cellulitis of right arm     Discharge Instructions      Today you were evaluated for the swelling to your arm, possibly related to localized reaction versus infection therefore we will provide coverage for both  Take cephalexin  twice daily for 5 days  Begin prednisone every morning for 5 days, take with food  Both medications should help reduce swelling to the arm and clear any contributing factor to your symptoms  You may help warm compresses to the affected area and 10 to 15-minute intervals  You may continue oral or topical Benadryl for management of itching  You may elevate whenever sitting and lying to further help reduce swelling  If your symptoms continue to persist please follow-up with your primary doctor or the urgent care for reevaluation   ED Prescriptions     Medication Sig Dispense Auth. Provider   cephALEXin  (KEFLEX ) 500 MG capsule Take 1 capsule (500 mg  total) by mouth 3 (three) times daily for 5 days. 15 capsule Ahuva Poynor R, NP   predniSONE (DELTASONE) 20 MG tablet Take 2 tablets (40 mg total) by mouth daily. 10 tablet Evanthia Maund R, NP      PDMP not reviewed this encounter.   Teresa Shelba SAUNDERS, NP 11/02/23 [phone]

## 2023-11-02 NOTE — Discharge Instructions (Signed)
 Today you were evaluated for the swelling to your arm, possibly related to localized reaction versus infection therefore we will provide coverage for both  Take cephalexin  twice daily for 5 days  Begin prednisone every morning for 5 days, take with food  Both medications should help reduce swelling to the arm and clear any contributing factor to your symptoms  You may help warm compresses to the affected area and 10 to 15-minute intervals  You may continue oral or topical Benadryl for management of itching  You may elevate whenever sitting and lying to further help reduce swelling  If your symptoms continue to persist please follow-up with your primary doctor or the urgent care for reevaluation

## 2023-11-27 ENCOUNTER — Other Ambulatory Visit: Payer: Self-pay | Admitting: *Deleted

## 2023-11-27 DIAGNOSIS — C61 Malignant neoplasm of prostate: Secondary | ICD-10-CM

## 2023-12-02 ENCOUNTER — Inpatient Hospital Stay: Attending: Radiation Oncology

## 2023-12-02 DIAGNOSIS — C61 Malignant neoplasm of prostate: Secondary | ICD-10-CM | POA: Diagnosis present

## 2023-12-02 LAB — PSA: Prostatic Specific Antigen: 0.02 ng/mL (ref 0.00–4.00)

## 2023-12-04 ENCOUNTER — Other Ambulatory Visit: Payer: Medicare PPO

## 2023-12-11 ENCOUNTER — Ambulatory Visit
Admission: RE | Admit: 2023-12-11 | Discharge: 2023-12-11 | Disposition: A | Discharge status: Home / Self Care | Payer: Medicare PPO | Source: Ambulatory Visit | Attending: Radiation Oncology | Admitting: Radiation Oncology

## 2023-12-11 ENCOUNTER — Encounter: Payer: Self-pay | Admitting: Radiation Oncology

## 2023-12-11 VITALS — BP 119/80 | HR 63 | Temp 96.9°F | Resp 15 | Ht 74.0 in | Wt 215.0 lb

## 2023-12-11 DIAGNOSIS — Z923 Personal history of irradiation: Secondary | ICD-10-CM | POA: Insufficient documentation

## 2023-12-11 DIAGNOSIS — C61 Malignant neoplasm of prostate: Secondary | ICD-10-CM | POA: Diagnosis present

## 2023-12-11 DIAGNOSIS — R351 Nocturia: Secondary | ICD-10-CM | POA: Diagnosis not present

## 2023-12-11 NOTE — Progress Notes (Signed)
 Established Patient Visit   Chief Complaint: Chief Complaint  Patient presents with  . Follow-up    6 month    Date of Service: 12/11/2023 Date of Birth: [birth year] PCP: Roberto Charlie Raring, MD  History of Present Illness: [name] is a 83 y.o.male patient who returns today for    1.  Paroxysmal atrial fibrillation discovered on Linq  2.  Essential hypertension  3.  Hyperlipidemia  4.  Type 2 diabetes  5.  Syncope, assumed secondary to PAF  6.  Prostate cancer status post XRT  On 11/11/2016, the patient experienced a syncopal episode, which lasted approximately 5 minutes.  The patient was evaluated at [facility] emergency room and admitted overnight.  ECG revealed sinus rhythm with right bundle branch block.  Patient remained in sinus rhythm on telemetry.  2D echocardiogram was performed on 11/12/2016 which revealed LVEF of 55-60%.  The patient was seen by Dr. Elspeth Sage in consult on 11/13/2016. Patient received an implantable loop monitor on 11/15/2016.  On 11/23/2016, the patient experienced an episode of dizziness, without presyncope, apparently resolved after laying down. Review of the loop monitor revealed an episode of atrial fibrillation and the patient was started on Eliquis  on 11/27/2016 for stroke prevention.  48 hour Holter monitor on 12/26/16 revealed predominant normal sinus rhythm with a mean heart rate of 62 bpm.  There were episodes of sinus bradycardia, with a minimum heart rate of 41 bpm and a maximum heart rate of 110 bpm.  There were 5 atrial runs, the longest lasting 5 beats.   The patient returns today for follow-up and reports doing  okay.  He denies chest pain or shortness of breath.  He denies palpitations or heart racing.  He denies peripheral edema.  He denies recurrent presyncope or syncope.  The patient did have an episode of dizziness during July 4 weekend.  The patient is active,  walks 2 miles 4 days a week.  ECG reveals sinus bradycardia with right bundle branch block  at 54 bpm.  Linq at end-of-life.  The patient was recently diagnosed with prostate cancer, finished radiation therapy.  The patient has paroxysmal atrial fibrillation with a chads vasc score 4, on Eliquis  for stroke prevention.  The patient denies melena or hematochezia.  The patient has essential hypertension, blood pressure well controlled on diltiazem  and losartan , which are well tolerated without apparent side effects.  The patient follows a low-sodium, no added salt diet.   The patient has hyperlipidemia; LDL cholesterol was 93 on 9 06/07/2022, currently on simvastatin , which is well tolerated without apparent side effects followed by his primary care provider.  He is scheduled for labs in 2 months.  Past Medical and Surgical History  Past Medical History Past Medical History:  Diagnosis Date  . Diabetes mellitus type 2, uncomplicated (CMS/HHS-HCC)   . Hypertension   . Syncope and collapse     Past Surgical History He has a past surgical history that includes Right cataract surgery; Right inguinal hernia repair; Colonoscopy; Repair of detached retina (Right); Laser surgery due to torn retina (Right); Implanted cardiac event monitor  (2018); and extraction cataract extracapsular w/insertion intraocular prosthesis (Left, 06/23/2018).   Medications and Allergies  Current Medications  Current Outpatient Medications  Medication Sig Dispense Refill  . blood-glucose sensor (DEXCOM G7 SENSOR) Devi Use 1 each every 10 (ten) days 9 each 0  . blood-glucose,receiver,cont (DEXCOM G7 RECEIVER) Misc Use 1 each as directed 1 each 0  . brimonidine  (ALPHAGAN ) 0.2 % ophthalmic  solution Place 1 drop into both eyes 2 (two) times daily     . cefdinir  (OMNICEF ) 300 mg capsule Take 300 mg by mouth 2 (two) times daily    . cetirizine (ZYRTEC) 10 mg capsule Take 10 mg by mouth once daily.    . cholecalciferol  (VITAMIN D3) 5,000 unit capsule Take 5,000 Units by mouth once daily.    . dilTIAZem  (CARDIZEM  CD)  180 MG CD capsule TAKE 1 CAPSULE BY MOUTH EVERY NIGHT 90 capsule 3  . dorzolamide -timolol  (COSOPT ) 2-0.5 % ophthalmic solution     . ELIQUIS  5 mg tablet take 1 tablet by mouth every 12 hours 180 tablet 3  . losartan  (COZAAR ) 100 MG tablet TAKE 1 TABLET BY MOUTH EVERY DAY 90 tablet 3  . mirabegron  (MYRBETRIQ ) 50 mg ER tablet Take 50 mg by mouth once daily    . multivitamin tablet Take 1 tablet by mouth 2 (two) times daily 1/2 tab    . omega-3s-dha-epa-fish oil  120 mg-180 mg- 60 mg-1,200 mg CpDR Take 2 tablets by mouth nightly     . omeprazole  (PRILOSEC) 20 MG DR capsule Take 20 mg by mouth once daily    . omeprazole  (PRILOSEC) 40 MG DR capsule TAKE 1 CAPSULE BY MOUTH EVERY DAY 90 capsule 3  . pioglitazone  (ACTOS ) 30 MG tablet Take 1 tablet (30 mg total) by mouth once daily 90 tablet 3  . rosuvastatin (CRESTOR) 20 MG tablet Take 1 tablet (20 mg total) by mouth once daily 30 tablet 11  . tamsulosin  (FLOMAX ) 0.4 mg capsule Take 0.4 mg by mouth daily after dinner     No current facility-administered medications for this visit.    Allergies: Sulfa (sulfonamide antibiotics)  Social and Family History  Social History  reports that he quit smoking about 60 years ago. His smoking use included cigarettes. He started smoking about 63 years ago. He has a 3 pack-year smoking history. He has never used smokeless tobacco. He reports that he does not drink alcohol and does not use drugs.  Family History Family History  Problem Relation Name Age of Onset  . Heart failure Mother    . Cataracts Father    . Macular degeneration Neg Hx    . Glaucoma Neg Hx      Review of Systems   Review of Systems: The patient denies chest pain, shortness of breath, orthopnea, paroxysmal nocturnal dyspnea, pedal edema, palpitations, heart racing, without recurrence of presyncope, syncope. Review of 8 Systems is negative except as described above.  Physical Examination   Vitals:BP 118/80   Pulse 58   Ht 188 cm (6'  2)   Wt 97.5 kg (215 lb)   SpO2 99%   BMI 27.60 kg/m  Ht:188 cm (6' 2) Wt:97.5 kg (215 lb) ADJ:Anib surface area is 2.26 meters squared. Body mass index is 27.6 kg/m.  General: Alert and oriented. Well-appearing. No acute distress. HEENT: Pupils equally reactive to light and accomodation    Neck: Supple, no JVD Lungs: Normal effort of breathing; clear to auscultation bilaterally; no wheezes, rales, rhonchi Heart: Regular rate and rhythm. No murmur, rub, or gallop Abdomen:  nondistended, with normal bowel sounds Extremities: no cyanosis, clubbing, or edema Peripheral Pulses: 2+ radial  Skin: Warm, dry, no diaphoresis  Assessment   83 y.o. male with  1. Sinus bradycardia   2. Paroxysmal atrial fibrillation (CMS/HHS-HCC)    83 year old gentleman with history of syncopal episode, with paroxysmal atrial fibrillation noted on implantable LINQ loop monitor.  The patient  has chads vasc score of 4, and was started on Eliquis  for stroke prevention, which is well tolerated.   Patient has essential hypertension, well controlled on current BP medications.  The patient has hyperlipidemia, on simvastatin .  Plan   1.  Continue current medications 2.  Recommend low-sodium diet  3.  DASH diet printed instructions given to the patient 4.  Recommend heart healthy diet 5.  Continue simvastatin  for hyperlipidemia management 6.  Continue Eliquis  for stroke prevention 7.  Return to clinic for follow-up in 6 months  Orders Placed This Encounter  Procedures  . ECG 12-lead    Return in about 6 months (around 06/12/2024).  MARSA DOOMS, MD PhD [facility]

## 2023-12-11 NOTE — Progress Notes (Signed)
 Radiation Oncology Follow up Note  Name: Roberto Leonard   Date:   12/11/2023 MRN:  [id number] DOB: 07-12-1940    This 83 y.o. male presents to the clinic today for 43-month follow-up status post image guided radiation therapy for Gleason 7 (4+3) adenocarcinoma the prostate presenting with a PSA of 6.3.  REFERRING PROVIDER: Bertrum Charlie LITTIE, MD  HPI: Patient is an 83 year old male now out 10 months having completed image guided IMRT radiation therapy for Gleason 7 adenocarcinoma the prostate seen today in routine follow-up he is still having some frequency and urgency of urination.SABRA  He states he has nocturia up to 5 times a night although on initial consultation he reported nocturia at least 2 x 3.  He has been tried on Flomax  as well as Sanctura  which he claims he is discontinued.  Patient has declined cystoscopy.  His most recent PSA is 0.02 showing excellent biochemical control of his prostate cancer.  COMPLICATIONS OF TREATMENT: none  FOLLOW UP COMPLIANCE: keeps appointments   PHYSICAL EXAM:  BP 119/80   Pulse 63   Temp (!) 96.9 F (36.1 C) (Tympanic)   Resp 15   Ht 6' 2 (1.88 m)   Wt 215 lb (97.5 kg)   BMI 27.60 kg/m  Well-developed well-nourished patient in NAD. HEENT reveals PERLA, EOMI, discs not visualized.  Oral cavity is clear. No oral mucosal lesions are identified. Neck is clear without evidence of cervical or supraclavicular adenopathy. Lungs are clear to A&P. Cardiac examination is essentially unremarkable with regular rate and rhythm without murmur rub or thrill. Abdomen is benign with no organomegaly or masses noted. Motor sensory and DTR levels are equal and symmetric in the upper and lower extremities. Cranial nerves II through XII are grossly intact. Proprioception is intact. No peripheral adenopathy or edema is identified. No motor or sensory levels are noted. Crude visual fields are within normal range.  RADIOLOGY RESULTS: No current films for review  PLAN:  Present time patient is doing well under excellent biochemical control of his prostate cancer.  I have suggested following up with urology for his increased lower urinary tract symptoms.  He is not amenable to doing that.  I have asked to see him back in 6 months for follow-up with a repeat PSA.  Patient knows to call with any concerns.  I would like to take this opportunity to thank you for allowing me to participate in the care of your patient.SABRA Marcey Penton, MD

## 2024-03-12 ENCOUNTER — Other Ambulatory Visit: Payer: Self-pay | Admitting: Orthopedic Surgery

## 2024-03-12 DIAGNOSIS — M25562 Pain in left knee: Secondary | ICD-10-CM

## 2024-03-23 ENCOUNTER — Ambulatory Visit
Admission: RE | Admit: 2024-03-23 | Discharge: 2024-03-23 | Disposition: A | Discharge status: Home / Self Care | Source: Ambulatory Visit | Attending: Orthopedic Surgery | Admitting: Orthopedic Surgery

## 2024-03-23 DIAGNOSIS — M25562 Pain in left knee: Secondary | ICD-10-CM | POA: Diagnosis present
